# Patient Record
Sex: Female | Born: 1965 | Race: Black or African American | Hispanic: No | Marital: Single | Smoking: Never smoker
Health system: Southern US, Community
[De-identification: ages and names within clinical notes are randomized; demographics above are authoritative.]

## PROBLEM LIST (undated history)

## (undated) DIAGNOSIS — R87619 Unspecified abnormal cytological findings in specimens from cervix uteri: Secondary | ICD-10-CM

## (undated) DIAGNOSIS — Z5189 Encounter for other specified aftercare: Secondary | ICD-10-CM

## (undated) DIAGNOSIS — F419 Anxiety disorder, unspecified: Secondary | ICD-10-CM

## (undated) DIAGNOSIS — F329 Major depressive disorder, single episode, unspecified: Secondary | ICD-10-CM

## (undated) DIAGNOSIS — I10 Essential (primary) hypertension: Secondary | ICD-10-CM

## (undated) DIAGNOSIS — IMO0002 Reserved for concepts with insufficient information to code with codable children: Secondary | ICD-10-CM

## (undated) DIAGNOSIS — R52 Pain, unspecified: Secondary | ICD-10-CM

## (undated) DIAGNOSIS — F32A Depression, unspecified: Secondary | ICD-10-CM

## (undated) DIAGNOSIS — M1990 Unspecified osteoarthritis, unspecified site: Secondary | ICD-10-CM

## (undated) DIAGNOSIS — Z8719 Personal history of other diseases of the digestive system: Secondary | ICD-10-CM

## (undated) DIAGNOSIS — D649 Anemia, unspecified: Secondary | ICD-10-CM

## (undated) DIAGNOSIS — K219 Gastro-esophageal reflux disease without esophagitis: Secondary | ICD-10-CM

## (undated) HISTORY — PX: TUBAL LIGATION: SHX77

## (undated) HISTORY — DX: Anxiety disorder, unspecified: F41.9

## (undated) HISTORY — DX: Encounter for other specified aftercare: Z51.89

## (undated) HISTORY — PX: COLONOSCOPY: SHX174

## (undated) HISTORY — PX: OTHER SURGICAL HISTORY: SHX169

## (undated) HISTORY — PX: ENDOMETRIAL ABLATION: SHX621

## (undated) HISTORY — PX: UPPER GASTROINTESTINAL ENDOSCOPY: SHX188

---

## 2002-11-03 ENCOUNTER — Emergency Department (HOSPITAL_COMMUNITY): Admission: EM | Admit: 2002-11-03 | Discharge: 2002-11-03 | Payer: Self-pay | Admitting: Emergency Medicine

## 2002-11-03 ENCOUNTER — Encounter: Payer: Self-pay | Admitting: Emergency Medicine

## 2003-04-21 ENCOUNTER — Emergency Department (HOSPITAL_COMMUNITY): Admission: EM | Admit: 2003-04-21 | Discharge: 2003-04-21 | Payer: Self-pay | Admitting: *Deleted

## 2004-06-24 ENCOUNTER — Emergency Department (HOSPITAL_COMMUNITY): Admission: EM | Admit: 2004-06-24 | Discharge: 2004-06-24 | Payer: Self-pay | Admitting: Emergency Medicine

## 2004-10-23 ENCOUNTER — Emergency Department (HOSPITAL_COMMUNITY): Admission: EM | Admit: 2004-10-23 | Discharge: 2004-10-23 | Payer: Self-pay | Admitting: Family Medicine

## 2004-11-06 ENCOUNTER — Ambulatory Visit (HOSPITAL_COMMUNITY): Admission: RE | Admit: 2004-11-06 | Discharge: 2004-11-06 | Payer: Self-pay | Admitting: Orthopedic Surgery

## 2004-11-06 ENCOUNTER — Encounter (INDEPENDENT_AMBULATORY_CARE_PROVIDER_SITE_OTHER): Payer: Self-pay | Admitting: *Deleted

## 2004-11-06 ENCOUNTER — Ambulatory Visit (HOSPITAL_BASED_OUTPATIENT_CLINIC_OR_DEPARTMENT_OTHER): Admission: RE | Admit: 2004-11-06 | Discharge: 2004-11-06 | Payer: Self-pay | Admitting: Orthopedic Surgery

## 2006-01-15 ENCOUNTER — Emergency Department (HOSPITAL_COMMUNITY): Admission: EM | Admit: 2006-01-15 | Discharge: 2006-01-15 | Payer: Self-pay | Admitting: Emergency Medicine

## 2006-10-22 ENCOUNTER — Emergency Department (HOSPITAL_COMMUNITY): Admission: EM | Admit: 2006-10-22 | Discharge: 2006-10-23 | Payer: Self-pay | Admitting: Emergency Medicine

## 2007-01-13 ENCOUNTER — Emergency Department (HOSPITAL_COMMUNITY): Admission: EM | Admit: 2007-01-13 | Discharge: 2007-01-13 | Payer: Self-pay | Admitting: Emergency Medicine

## 2007-01-24 ENCOUNTER — Encounter: Admission: RE | Admit: 2007-01-24 | Discharge: 2007-01-24 | Payer: Self-pay | Admitting: Gastroenterology

## 2007-01-24 ENCOUNTER — Encounter: Payer: Self-pay | Admitting: Gastroenterology

## 2007-02-04 ENCOUNTER — Ambulatory Visit (HOSPITAL_COMMUNITY): Admission: RE | Admit: 2007-02-04 | Discharge: 2007-02-04 | Payer: Self-pay | Admitting: Gastroenterology

## 2007-02-04 ENCOUNTER — Encounter: Payer: Self-pay | Admitting: Gastroenterology

## 2007-07-12 ENCOUNTER — Emergency Department (HOSPITAL_COMMUNITY): Admission: EM | Admit: 2007-07-12 | Discharge: 2007-07-13 | Payer: Self-pay | Admitting: Emergency Medicine

## 2007-07-28 ENCOUNTER — Ambulatory Visit: Payer: Self-pay | Admitting: Obstetrics and Gynecology

## 2007-09-01 ENCOUNTER — Ambulatory Visit (HOSPITAL_COMMUNITY): Admission: RE | Admit: 2007-09-01 | Discharge: 2007-09-01 | Payer: Self-pay | Admitting: Obstetrics and Gynecology

## 2008-02-25 ENCOUNTER — Emergency Department (HOSPITAL_COMMUNITY): Admission: EM | Admit: 2008-02-25 | Discharge: 2008-02-25 | Payer: Self-pay | Admitting: Emergency Medicine

## 2008-08-13 ENCOUNTER — Emergency Department (HOSPITAL_COMMUNITY): Admission: EM | Admit: 2008-08-13 | Discharge: 2008-08-13 | Payer: Self-pay | Admitting: Emergency Medicine

## 2008-09-22 ENCOUNTER — Emergency Department (HOSPITAL_COMMUNITY): Admission: EM | Admit: 2008-09-22 | Discharge: 2008-09-22 | Payer: Self-pay | Admitting: Emergency Medicine

## 2008-10-12 ENCOUNTER — Ambulatory Visit (HOSPITAL_COMMUNITY): Admission: RE | Admit: 2008-10-12 | Discharge: 2008-10-12 | Payer: Self-pay | Admitting: Chiropractic Medicine

## 2009-01-18 ENCOUNTER — Emergency Department (HOSPITAL_COMMUNITY): Admission: EM | Admit: 2009-01-18 | Discharge: 2009-01-18 | Payer: Self-pay | Admitting: Emergency Medicine

## 2009-07-11 ENCOUNTER — Encounter: Payer: Self-pay | Admitting: Gastroenterology

## 2009-08-08 ENCOUNTER — Ambulatory Visit: Payer: Self-pay | Admitting: Gastroenterology

## 2009-08-08 DIAGNOSIS — D509 Iron deficiency anemia, unspecified: Secondary | ICD-10-CM | POA: Insufficient documentation

## 2009-08-08 DIAGNOSIS — R1013 Epigastric pain: Secondary | ICD-10-CM

## 2009-08-08 DIAGNOSIS — K625 Hemorrhage of anus and rectum: Secondary | ICD-10-CM | POA: Insufficient documentation

## 2009-08-08 DIAGNOSIS — K3189 Other diseases of stomach and duodenum: Secondary | ICD-10-CM | POA: Insufficient documentation

## 2009-08-14 ENCOUNTER — Telehealth: Payer: Self-pay | Admitting: Gastroenterology

## 2009-08-14 ENCOUNTER — Ambulatory Visit (HOSPITAL_COMMUNITY): Admission: RE | Admit: 2009-08-14 | Discharge: 2009-08-14 | Payer: Self-pay | Admitting: Gastroenterology

## 2009-08-14 ENCOUNTER — Ambulatory Visit: Payer: Self-pay | Admitting: Gastroenterology

## 2009-08-15 ENCOUNTER — Encounter: Payer: Self-pay | Admitting: Gastroenterology

## 2009-08-19 ENCOUNTER — Telehealth: Payer: Self-pay | Admitting: Physician Assistant

## 2009-08-22 ENCOUNTER — Encounter: Payer: Self-pay | Admitting: Gastroenterology

## 2009-08-22 ENCOUNTER — Ambulatory Visit: Payer: Self-pay | Admitting: Gastroenterology

## 2009-08-23 ENCOUNTER — Telehealth: Payer: Self-pay | Admitting: Gastroenterology

## 2009-08-26 ENCOUNTER — Encounter: Payer: Self-pay | Admitting: Gastroenterology

## 2009-09-10 ENCOUNTER — Telehealth: Payer: Self-pay | Admitting: Gastroenterology

## 2009-10-28 ENCOUNTER — Ambulatory Visit: Payer: Self-pay | Admitting: Gastroenterology

## 2009-10-28 DIAGNOSIS — K261 Acute duodenal ulcer with perforation: Secondary | ICD-10-CM | POA: Insufficient documentation

## 2009-11-14 ENCOUNTER — Telehealth: Payer: Self-pay | Admitting: Gastroenterology

## 2009-12-05 ENCOUNTER — Encounter: Admission: RE | Admit: 2009-12-05 | Discharge: 2009-12-05 | Payer: Self-pay | Admitting: Internal Medicine

## 2010-01-01 ENCOUNTER — Emergency Department (HOSPITAL_COMMUNITY): Admission: EM | Admit: 2010-01-01 | Discharge: 2010-01-01 | Payer: Self-pay | Admitting: Emergency Medicine

## 2010-02-06 ENCOUNTER — Encounter: Payer: Self-pay | Admitting: Gastroenterology

## 2010-02-18 ENCOUNTER — Ambulatory Visit: Payer: Self-pay | Admitting: Vascular Surgery

## 2010-02-18 ENCOUNTER — Encounter (INDEPENDENT_AMBULATORY_CARE_PROVIDER_SITE_OTHER): Payer: Self-pay | Admitting: Emergency Medicine

## 2010-02-18 ENCOUNTER — Emergency Department (HOSPITAL_COMMUNITY): Admission: EM | Admit: 2010-02-18 | Discharge: 2010-02-18 | Payer: Self-pay | Admitting: Emergency Medicine

## 2010-04-08 ENCOUNTER — Encounter: Payer: Self-pay | Admitting: Gastroenterology

## 2010-05-27 ENCOUNTER — Telehealth: Payer: Self-pay | Admitting: Gastroenterology

## 2010-09-02 ENCOUNTER — Emergency Department (HOSPITAL_BASED_OUTPATIENT_CLINIC_OR_DEPARTMENT_OTHER): Admission: EM | Admit: 2010-09-02 | Discharge: 2010-09-02 | Payer: Self-pay | Admitting: Emergency Medicine

## 2010-09-09 ENCOUNTER — Telehealth: Payer: Self-pay | Admitting: Gastroenterology

## 2010-09-15 ENCOUNTER — Telehealth: Payer: Self-pay | Admitting: Gastroenterology

## 2010-10-13 ENCOUNTER — Telehealth: Payer: Self-pay | Admitting: Gastroenterology

## 2010-10-14 ENCOUNTER — Ambulatory Visit: Payer: Self-pay | Admitting: Gastroenterology

## 2010-10-14 DIAGNOSIS — K648 Other hemorrhoids: Secondary | ICD-10-CM | POA: Insufficient documentation

## 2010-10-14 DIAGNOSIS — K269 Duodenal ulcer, unspecified as acute or chronic, without hemorrhage or perforation: Secondary | ICD-10-CM | POA: Insufficient documentation

## 2010-10-14 DIAGNOSIS — K589 Irritable bowel syndrome without diarrhea: Secondary | ICD-10-CM | POA: Insufficient documentation

## 2010-10-14 DIAGNOSIS — K219 Gastro-esophageal reflux disease without esophagitis: Secondary | ICD-10-CM | POA: Insufficient documentation

## 2010-10-14 DIAGNOSIS — K644 Residual hemorrhoidal skin tags: Secondary | ICD-10-CM | POA: Insufficient documentation

## 2010-10-15 ENCOUNTER — Encounter: Payer: Self-pay | Admitting: Physician Assistant

## 2010-10-15 ENCOUNTER — Ambulatory Visit: Payer: Self-pay | Admitting: Physician Assistant

## 2010-10-15 LAB — CONVERTED CEMR LAB
Albumin ELP: 54.3 % — ABNORMAL LOW (ref 55.8–66.1)
Alpha-1-Globulin: 3.9 % (ref 2.9–4.9)
Alpha-2-Globulin: 10.5 % (ref 7.1–11.8)
Beta Globulin: 5.9 % (ref 4.7–7.2)
Gamma Globulin: 19.6 % — ABNORMAL HIGH (ref 11.1–18.8)
Total Protein, Serum Electrophoresis: 7.4 g/dL (ref 6.0–8.3)

## 2010-10-21 LAB — CONVERTED CEMR LAB: H Pylori IgG: POSITIVE

## 2010-12-04 NOTE — Miscellaneous (Signed)
Summary: Percocet DENIED  Recieved a refill request for Percocet I [facility] ad advied them that the patient can not have any refills due to the fact that she has not had an office visit since last December and she no showed her last office visit. They state they will advise the patient. If she wants refills she must call and make an office visit with Dr. Arlyce Dice to discuss.

## 2010-12-04 NOTE — Assessment & Plan Note (Addendum)
Summary: Rectal Bleeding/LRH   History of Present Illness Visit Type: Follow-up Visit Primary GI MD: Melvia Heaps MD [facility] Primary Deanna Rodgers: Fleet Contras, MD Requesting Kinisha Rodgers: n/a Chief Complaint: rectal bleeding History of Present Illness:   45 YO FEMALE KNOWN TO DR. KAPLAN. SHE HAD A COLONOSCOPY IN 2008 BY DR. Randa Evens WHICH SHOWED DIVERTICULOSIS, AND HEMORRHOIDS. SHE SAW DR. KAPLAN IN OCT 2010 WITH C/O PERSISTENT RECTAL BLEEDING AND UNDERWENT FLEX-SIGMOID WITH BANDING OF INTERNAL HEMORRHOIDS. SHE ALSO HAD AN ENDOSCOPY IN OCT 2010 WHICH SHOWED A SMALL DUODENAL BULB ULCER. SHE WAS TREATED WITH ZEGERID.  SHE COMES IN TODAY WITH C/O PERSISTENT BLEEDING. SHE SAYS SHE REALLY DID NOT SEE ALOT OF IMPROVEMENT IN HER SXS AFTER THE BANDING,AND HAS BEEN SEEING BLOOD WITH ALMOST EVERY BM OVER THE PAST YEAR. SHE IS CONCERNED AS SHE WAS TOLD THAT SHE IS ANEMIC BY HER PRIMARY MD. SHE REALLY IS NOT  HAVING RECTAL PAIN. SHE DOES C/O ABDOMINAL DISCOMFOrt,CRAMPING ABD BLOATIING OFF AND ON. 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   GI Review of Systems    Reports abdominal pain.     Location of  Abdominal pain:  YO FEMALE lower abdomen.    Denies acid reflux, belching, bloating, chest pain, dysphagia with liquids, dysphagia with solids, heartburn, loss of appetite, nausea, vomiting, vomiting blood, weight loss, and  weight gain.      Reports rectal bleeding.     Denies anal fissure, black tarry stools, change in bowel habit, constipation, diarrhea, diverticulosis, fecal incontinence, heme positive stool, hemorrhoids, irritable bowel syndrome, jaundice, light color stool, liver problems, and  rectal pain.    Current Medications (verified): 1)  Percocet 10-650 Mg Tabs (Oxycodone-Acetaminophen) .... Take One Every 4-6 Hours As Needed For Rectal Pain. 2)  Ultram 50 Mg Tabs (Tramadol Hcl) .... Take 1 P.o. Every 6 Hrs. As  Needed For Pain 3)  Darvocet-N 100 100-650 Mg Tabs (Propoxyphene N-Apap) .Marland Kitchen.. 1 Tab Every 4-6 Hours As Needed 4)  Nexium 40 Mg Cpdr (Esomeprazole Magnesium) .... Take 1 Capsule 30 Min Prior To Breakfast 5)  Anusol-Hc 25 Mg Supp (Hydrocortisone Acetate) .... Use 1 Suppository At Bedtime For 1 Month 6)  Dicyclomine Hcl 10 Mg Caps (Dicyclomine Hcl) .... Take 1 Capsule 2-3 Times Daily As Needed For Cramping and Stomach Pain  Allergies (verified): No Known Drug Allergies  Past History:  Past Medical History: DIVERTICULOSIS INTERNAL HEMORRHOIDS HX OF DUODENAL ULCER 2010.  Past Surgical History: Reviewed history from 08/08/2009 and no changes required. C section Tubal Ligation  Family History: Reviewed history from 08/08/2009 and no changes required. Family History of Diabetes: maternal grandmother Family History of Irritable Bowel Syndrome:maternal grandmother Family History of Crohn's: maternal uncle No FH of Colon Cancer  Social History: Reviewed history from 10/28/2009 and no changes required. Occupation: [employer]  Patient is a former smoker.  Alcohol Use - yes Daily Caffeine Use Illicit Drug Use - no  Review of Systems  The patient denies allergy/sinus, anemia, anxiety-new, arthritis/joint pain, back pain, blood in urine, breast changes/lumps, change in vision, confusion, cough, coughing up blood, depression-new, fainting, fatigue, fever, headaches-new, hearing problems, heart murmur, heart rhythm changes, itching, menstrual pain, muscle pains/cramps, night sweats, nosebleeds, pregnancy symptoms, shortness of breath, skin rash, sleeping problems, sore throat, swelling of feet/legs, swollen lymph glands, thirst - excessive , urination - excessive , urination changes/pain, urine leakage, vision changes, and voice  change.         SEE HPI  Vital Signs:  Patient profile:   45 year old female Height:      66 inches Weight:      160 pounds BMI:     25.92 Pulse rate:    78 / minute Pulse rhythm:   regular BP sitting:   140 / 74  (right arm)  Vitals Entered By: Chales Abrahams CMA Duncan Dull) (October 14, 2010 9:51 AM)  Physical Exam  General:  Well developed, well nourished, no acute distress. Head:  Normocephalic and atraumatic. Eyes:  PERRLA, no icterus. Lungs:  Clear throughout to auscultation. Heart:  Regular rate and rhythm; no murmurs, rubs,  or bruits. Abdomen:  SOFT, NONTENDER, NO MASS OR HSM,BS+ Rectal:  INTERNAL AND EXTERNAL HEMORRHOIDS,NONTHROMBOSED, NO STOOL IN VAULT,CLEAR MUCOID  BLOOD ON GLOVE Extremities:  No clubbing, cyanosis, edema or deformities noted. Neurologic:  Alert and  oriented x4;  grossly normal neurologically. Psych:  Alert and cooperative. Normal mood and affect.   Impression & Recommendations:  Problem # 1:  RECTAL BLEEDING (ICD-569.3) Assessment Unchanged 45 YO FEMALE WITH PERSISTENT SMALL VOLUME HEMATOCHEZIA OVE RTHE PAST ONE YEAR S/P BANDING OF INTERNAL HEMORRHOIDS. HER BLEEDING IS HEMORRHOIDAL WITH INTERNAL AND EXTERNAL HEMORRHOIDS ON EXAM. ? OF ANEMIA   OBTAIN RESULTS OF RECENT LABS FROM HER PRIMARY M.D -DR. AVBUERE SCHEDULE FOR SURGICAL CONSULT ANUSOL HC SUPP at bedtime X ONE MONTH  Problem # 2:  IRRITABLE BOWEL SYNDROME (ICD-564.1) Assessment: Unchanged TRY BENTYL 10 MG 2-3 S DAILY AS NEEDED FOR CRAMPING  Problem # 3:  ACUT DUODEN ULCER W/PERF W/O MENTION OBSTRUCTION (ICD-532.10) Assessment: Unchanged TREATED  10/10 ,PERSISTENT GERD SXS   RESTART TRIAL OF NEXIUM 40 MG DAILY CHECK H.PYLORI AB  Problem # 4:  DIVERTICULOSIS Assessment: Comment Only  Other Orders: TLB-H. Pylori Abs(Helicobacter Pylori) ([id number]-[id number])  Patient Instructions: 1)  We sent prescriptions for D[facility]clomine and [facility] [address] 2)  We have given you samples of nexium. Take 1 capsule 30 min prior to breakfast.  You can use the Zegerid or Prilosec OTC after the samples are gone. 3)  We will call you with the  appointment at [facility].  We are in contact with them regarding the appoinment. 4)  Copy sent to : Fleet Contras, Md 5)  The medication list was reviewed and reconciled.  All changed / newly prescribed medications were explained.  A complete medication list was provided to the patient / caregiver. Prescriptions: DICYCLOMINE HCL 10 MG CAPS (DICYCLOMINE HCL) Take 1 capsule 2-3 times daily as needed for cramping and stomach pain  #90 x 1   Entered by:   Lowry Ram NCMA   Authorized by:   Sammuel Cooper PA-c   Signed by:   Lowry Ram NCMA on 10/14/2010   Method used:   [facility]  [address] [id number]       [address]       [place], [place]  [address]       Ph: [phone] or [phone]       Fax: [phone]   RxID:   [id number] ANUSOL-HC 25 MG SUPP (HYDROCORTISONE ACETATE) Use 1 suppository at bedtime for 1 month  #30 x 0   Entered by:   Lowry Ram NCMA   Authorized by:   Sammuel Cooper PA-c   Signed by:   Lowry Ram  NCMA on 10/14/2010   Method used:   [facility]        [facility]  [address] [id number]       [address]       [place], [place]  [address]       Ph: [phone] or [phone]       Fax: [phone]   RxID:   [id number]   Appended Document: Rectal Bleeding/LRH Called pt to advise her to come to our lab today or tomorrow and to come to our 3rd floor to pick up samples of iron supplement, Integra Plus.  I asked her to take a note to come to our lab on 3-11-03-10 to repeat iron study labs.  I put in a flag for myself to call her 01-01-11. Also let her know the appt at [facility] with Dr. Karie Soda is 10-31-2010 arriving at 2:30PM for a 2:50 PM appt.  Appended Document: Rectal Bleeding/LRH I called the pt today and told her to come to the lab for the CBC Diff,  FE/TIBC, FE SAT . I sent myselt a flag from Dec 2011. You saw the pt and wanted her to have these labs in  March 2012.  She did take the Iron samples we gave her.

## 2010-12-04 NOTE — Progress Notes (Signed)
Summary: Percocet refill denied  Phone Note Refill Request Message from:  Fax from Pharmacy on May 27, 2010 11:10 AM  Refills Requested: Medication #1:  PERCOCET 10-650 MG TABS Take one every 4-6 hours as needed for rectal pain. Denied pt needs an office appointment before any further refills   Method Requested: Fax to Local Pharmacy Initial call taken by: Merri Ray CMA Duncan Dull),  May 27, 2010 11:11 AM

## 2010-12-04 NOTE — Progress Notes (Signed)
Summary: refill fax request from pharmacy  Phone Note Refill Request Message from:  Fax from Pharmacy on November 14, 2009 3:18 PM  Refills Requested: Medication #1:  DARVOCET-N 100 100-650 MG TABS 1 tab every 4-6 hours as needed   Dosage confirmed as above?Dosage Confirmed   Brand Name Necessary? No Pt must keep scheduled appointment on 12/02/2009   No Further refills   Method Requested: Fax to Local Pharmacy Next Appointment Scheduled: 12/02/2009 Initial call taken by: Merri Ray CMA Duncan Dull),  November 14, 2009 3:18 PM

## 2010-12-04 NOTE — Medication Information (Signed)
Summary: Propoxyph Acetaminophn [facility]  [facility]n [facility]   Imported By: Lennie Odor 02/20/2010 14:51:09  _____________________________________________________________________  External Attachment:    Type:   Image     Comment:   External Document

## 2010-12-04 NOTE — Progress Notes (Signed)
Summary: Zegerid refill request  Phone Note Refill Request Message from:  Fax from Pharmacy on September 09, 2010 10:45 AM  Refills Requested: Medication #1:  ZEGERID OTC 20-1100 MG CAPS 1 by mouth once daily   Dosage confirmed as above?Dosage Confirmed   Brand Name Necessary? No   Supply Requested: 3 months Initial call taken by: Merri Ray CMA Duncan Dull),  September 09, 2010 10:46 AM    Prescriptions: ZEGERID OTC 20-1100 MG CAPS (OMEPRAZOLE-SODIUM BICARBONATE) 1 by mouth once daily  #30 x 2   Entered by:   Merri Ray CMA (AAMA)   Authorized by:   Louis Meckel MD   Signed by:   Merri Ray CMA (AAMA) on 09/09/2010   Method used:   [facility]        [facility]  [address] [id number]       [address]       [place], [place]  [address]       Ph: [phone] or [phone]       Fax: [phone]   RxID:   [id number]

## 2010-12-04 NOTE — Progress Notes (Signed)
Summary: Insurance Zegerid  Phone Note [employer] back at [employer] [phone]   Call placed by: Merri Ray CMA Duncan Dull),  September 15, 2010 10:01 AM Summary of Call: Called pts insurance Medicaid at [phone], Pt needs to have tried and failed Nexium this year before thewill give her Zegerid. L/M for pt to return call Initial call taken by: Merri Ray CMA Duncan Dull),  September 15, 2010 10:02 AM  Follow-up for Phone Call        Tried to contact pt about her trying Nexium, can not get in touch with pt. Will wait and send in prescription when pt returns call. Follow-up by: Merri Ray CMA Duncan Dull),  September 18, 2010 10:56 AM     Appended Document: Insurance Zegerid Call pt and ask her if Nexium helped. If so, Medicaid may cover Nexium.

## 2010-12-04 NOTE — Progress Notes (Signed)
Summary: Triage  Phone Note Call from Patient Call back at [phone]   Caller: Patient Call For: Dr. Arlyce Dice Reason for Call: Talk to Nurse Summary of Call: having rectal bleeding, anemic....requesting sooner appt. than next avail. Initial call taken by: Karna Christmas,  October 13, 2010 12:33 PM  Follow-up for Phone Call        Spoke with patient and she states she is having problems with Rectal bleeding, states she wants to be seen. Appointment made with Mike Gip, PA for 10/14/10 @ 10am

## 2010-12-19 ENCOUNTER — Encounter (HOSPITAL_COMMUNITY): Payer: Medicaid Other | Attending: Surgery

## 2010-12-19 ENCOUNTER — Other Ambulatory Visit: Payer: Self-pay | Admitting: Surgery

## 2010-12-19 DIAGNOSIS — Z01812 Encounter for preprocedural laboratory examination: Secondary | ICD-10-CM | POA: Insufficient documentation

## 2010-12-19 LAB — CBC
HCT: 35.6 % — ABNORMAL LOW (ref 36.0–46.0)
Hemoglobin: 10.8 g/dL — ABNORMAL LOW (ref 12.0–15.0)
MCH: 20.8 pg — ABNORMAL LOW (ref 26.0–34.0)
MCHC: 30.3 g/dL (ref 30.0–36.0)
MCV: 68.6 fL — ABNORMAL LOW (ref 78.0–100.0)
Platelets: 181 10*3/uL (ref 150–400)
RBC: 5.19 MIL/uL — ABNORMAL HIGH (ref 3.87–5.11)
RDW: 16.2 % — ABNORMAL HIGH (ref 11.5–15.5)
WBC: 4.3 10*3/uL (ref 4.0–10.5)

## 2010-12-19 LAB — HCG, SERUM, QUALITATIVE: Preg, Serum: NEGATIVE

## 2010-12-19 LAB — SURGICAL PCR SCREEN
MRSA, PCR: NEGATIVE
Staphylococcus aureus: NEGATIVE

## 2010-12-21 ENCOUNTER — Emergency Department (HOSPITAL_COMMUNITY)
Admission: EM | Admit: 2010-12-21 | Discharge: 2010-12-21 | Disposition: A | Discharge status: Home / Self Care | Payer: Medicaid Other | Attending: Emergency Medicine | Admitting: Emergency Medicine

## 2010-12-21 DIAGNOSIS — H109 Unspecified conjunctivitis: Secondary | ICD-10-CM | POA: Insufficient documentation

## 2010-12-21 DIAGNOSIS — H11429 Conjunctival edema, unspecified eye: Secondary | ICD-10-CM | POA: Insufficient documentation

## 2010-12-21 DIAGNOSIS — Z79899 Other long term (current) drug therapy: Secondary | ICD-10-CM | POA: Insufficient documentation

## 2010-12-21 DIAGNOSIS — K219 Gastro-esophageal reflux disease without esophagitis: Secondary | ICD-10-CM | POA: Insufficient documentation

## 2010-12-21 DIAGNOSIS — H5789 Other specified disorders of eye and adnexa: Secondary | ICD-10-CM | POA: Insufficient documentation

## 2010-12-26 ENCOUNTER — Ambulatory Visit (HOSPITAL_COMMUNITY)
Admission: RE | Admit: 2010-12-26 | Discharge: 2010-12-26 | Disposition: A | Discharge status: Home / Self Care | Payer: Medicaid Other | Source: Ambulatory Visit | Attending: Surgery | Admitting: Surgery

## 2010-12-26 ENCOUNTER — Other Ambulatory Visit: Payer: Self-pay | Admitting: Surgery

## 2010-12-26 DIAGNOSIS — K644 Residual hemorrhoidal skin tags: Secondary | ICD-10-CM | POA: Insufficient documentation

## 2010-12-26 DIAGNOSIS — D573 Sickle-cell trait: Secondary | ICD-10-CM | POA: Insufficient documentation

## 2010-12-26 DIAGNOSIS — K648 Other hemorrhoids: Secondary | ICD-10-CM | POA: Insufficient documentation

## 2011-01-01 ENCOUNTER — Other Ambulatory Visit: Payer: Self-pay

## 2011-01-05 ENCOUNTER — Other Ambulatory Visit: Payer: Self-pay | Admitting: Internal Medicine

## 2011-01-12 ENCOUNTER — Other Ambulatory Visit: Payer: Medicaid Other

## 2011-01-12 NOTE — Op Note (Signed)
Deanna Rodgers, Deanna Rodgers              ACCOUNT NO.:  [id number]  MEDICAL RECORD NO.:  [id number]           PATIENT TYPE:  O  LOCATION:  DAYL                         FACILITY:  [facility]  PHYSICIAN:  Ardeth Sportsman, MD     DATE OF BIRTH:  July 07, 1966  DATE OF PROCEDURE:  12/26/2010 DATE OF DISCHARGE:                              OPERATIVE REPORT   PRIMARY CARE PHYSICIAN:  Fleet Contras, MD  GASTROENTEROLOGIST:  Barbette Hair. Arlyce Dice, MD, [facility].  SURGEON:  Ardeth Sportsman, MD  PREOPERATIVE DIAGNOSIS:  Internal and external hemorrhoids with prolapse and bleeding.  POSTOPERATIVE DIAGNOSIS:  Internal and external hemorrhoids with prolapse and bleeding.  PROCEDURE PERFORMED: 1. Procedure for prolapse and hemorrhoids and stapled hemorrhoidopexy. 2. Right posterior and anterior external hemorrhoidectomies.  ANESTHESIA: 1. General anesthesia. 2. Bilateral anorectal anterior and posterior regional nerve blocks.  SPECIMENS: 1. 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 2. Right posterior and right anterior external hemorrhoids.  ESTIMATED BLOOD LOSS:  Minimal.  COMPLICATIONS:  None apparent.  DRAINS:  None.  INDICATION:  Deanna Rodgers is a 45 year old female who struggled with constipation for sometime.  She has been followed by Dr. Concepcion Elk and Dr. Arlyce Dice.  She has had this better regulated.  She had a prior colonoscopy that just showed diverticulum only.  She has persistent bleeding and has some anemia and she had worsening prolapse.  I had already injected and she had banding in the past and had persistent bleeding problems.  Anatomy and physiology of the anorectal canal and anorectum mechanism was discussed.  Pathophysiology of hemorrhoids was discussed.  Given the fact that they are now prolapsed and persistent bleed despite office management only, I recommended consideration of hemorrhoidectomy. Because she has significant mucosal prolapse, I thought she  would be a good candidate for PPH as THD is not currently available through the insurance firm.  Possibility of excision of external hemorrhoid tags as well was discussed.  Risks, benefits, alternatives discussed.  Questions answered and she agreed to proceed.  OPERATION FINDINGS:  She had a significant hemorrhoidal tissue in all 3 piles, especially internally.  Externally, she had a significant right posterior greater than right anterior hemorrhoids.  Left lateral hemorrhoid did not have any significant external component.  DESCRIPTION OF PROCEDURE:  Informed consent was confirmed.  The patient underwent general anesthesia without any difficulty.  She received IV cefoxitin.  She was positioned in prone position.  Her buttocks were gently taped apart and her perineum, rectal canal, and vaginal canal were separately prepped and then almost draped in a sterile fashion.  A surgical timeout confirmed our plan.  I placed the anorectal block with first 10 mL with bupivacaine and 10% dilution of Wydase.  I did examination to confirm findings noted above. I placed a 2-0 Novafil Prolene stitch on a taper needle.  I bent it a little bit to make it a UR-6.  I did this about 5-6 cm proximal to the anal verge with significant pursestring stitch.  I pulled the pursestring down and had good pursestring of the mucosal tissue.  I did rectovaginal examination and vagina  was not pulled into this.  I placed the dilator and tails through the [facility] stapler and advanced the anvil more proximally.  I advanced the anvil into the anal canal and the PPH stapling system into the anal canal and tightened down the anvil. The stapler was at 4.5 cm in the canal.  I held the stapler clamp for a minute, fired for 30 seconds and released.  Inspection noted a good healthy swab of mucosal tissue.  Of note, I did vaginal examination after stapler was tied down and after it was fired and the rectovaginal septum was not  involved.  I had a good serous circumferential bite.  There was a little bit of oozing on the staple line in the anterior midline, which I controlled with few interrupted figure-of-8 stitches x3.  On inspection, she still had persistent external hemorrhoid on the right posterior greater than right anterior component.  Because of that, I decided to do external hemorrhoidectomy.  I excised them using a Harmonic stapler for better hemostasis taking care to remove the largest pedunculated and tapering it down more proximally into the anal canal a few centimeters.  She did have some tissue in the right lateral aspect, but I left healthy 1.5 cm tract in between the anterior and posterior resections from bridge.  I closed those wounds using 4-0 Vicryl running stitches from the rectum to the anoderm longitudinally and then closed the external skin to the anoderm transversally in sort of a T-shaped closure to minimize stenosis.  Hemostasis was excellent.  I did confirm with inspection and hemostasis was good on the staple line and elsewhere.  I placed a Gelfoam bullet into the region.  Placed another 20 cc of local for more significant block.  The patient was extubated and taken to the recovery room in stable condition.  I discussed postop care in detail with the patient in the office and in the holding area as well.  I have written instructions and I am about to discuss it with the family as well.     Ardeth Sportsman, MD     SCG/MEDQ  D:  12/26/2010  T:  12/26/2010  Job:  [id number]  cc:   Fleet Contras, M.D. Fax: [phone]  Barbette Hair. Arlyce Dice, MD,FACG [address] [place] [address]  Electronically Signed by Karie Soda MD on 01/12/2011 12:21:39 PM

## 2011-01-13 LAB — URINALYSIS, ROUTINE W REFLEX MICROSCOPIC
Bilirubin Urine: NEGATIVE
Glucose, UA: NEGATIVE mg/dL
Hgb urine dipstick: NEGATIVE
Ketones, ur: NEGATIVE mg/dL
Nitrite: NEGATIVE
Protein, ur: NEGATIVE mg/dL
Specific Gravity, Urine: 1.004 — ABNORMAL LOW (ref 1.005–1.030)
Urobilinogen, UA: 0.2 mg/dL (ref 0.0–1.0)
pH: 7.5 (ref 5.0–8.0)

## 2011-01-13 LAB — CBC
HCT: 34.6 % — ABNORMAL LOW (ref 36.0–46.0)
Hemoglobin: 10.7 g/dL — ABNORMAL LOW (ref 12.0–15.0)
MCH: 21.1 pg — ABNORMAL LOW (ref 26.0–34.0)
MCHC: 31 g/dL (ref 30.0–36.0)
MCV: 67.9 fL — ABNORMAL LOW (ref 78.0–100.0)
Platelets: 184 10*3/uL (ref 150–400)
RBC: 5.1 MIL/uL (ref 3.87–5.11)
RDW: 15.4 % (ref 11.5–15.5)
WBC: 4.8 10*3/uL (ref 4.0–10.5)

## 2011-01-13 LAB — BASIC METABOLIC PANEL
BUN: 8 mg/dL (ref 6–23)
CO2: 26 mEq/L (ref 19–32)
Calcium: 9 mg/dL (ref 8.4–10.5)
Chloride: 104 mEq/L (ref 96–112)
Creatinine, Ser: 0.6 mg/dL (ref 0.4–1.2)
GFR calc Af Amer: >60 mL/min (ref >60)
GFR calc non Af Amer: >60 mL/min (ref >60)
Glucose, Bld: 77 mg/dL (ref 70–99)
Potassium: 3.4 mEq/L — ABNORMAL LOW (ref 3.5–5.1)
Sodium: 143 mEq/L (ref 135–145)

## 2011-01-13 LAB — PREGNANCY, URINE: Preg Test, Ur: NEGATIVE

## 2011-01-13 LAB — DIFFERENTIAL
Basophils Absolute: 0.1 10*3/uL (ref 0.0–0.1)
Basophils Relative: 2 % — ABNORMAL HIGH (ref 0–1)
Eosinophils Absolute: 0 10*3/uL (ref 0.0–0.7)
Eosinophils Relative: 1 % (ref 0–5)
Lymphocytes Relative: 37 % (ref 12–46)
Lymphs Abs: 1.8 10*3/uL (ref 0.7–4.0)
Monocytes Absolute: 0.4 10*3/uL (ref 0.1–1.0)
Monocytes Relative: 9 % (ref 3–12)
Neutro Abs: 2.5 10*3/uL (ref 1.7–7.7)
Neutrophils Relative %: 52 % (ref 43–77)

## 2011-01-13 LAB — HEMOCCULT GUIAC POC 1CARD (OFFICE): Fecal Occult Bld: NEGATIVE

## 2011-01-20 LAB — DIFFERENTIAL
Basophils Absolute: 0 10*3/uL (ref 0.0–0.1)
Basophils Relative: 0 % (ref 0–1)
Eosinophils Absolute: 0.1 10*3/uL (ref 0.0–0.7)
Eosinophils Relative: 1 % (ref 0–5)
Lymphocytes Relative: 37 % (ref 12–46)
Lymphs Abs: 2 10*3/uL (ref 0.7–4.0)
Monocytes Absolute: 0.6 10*3/uL (ref 0.1–1.0)
Monocytes Relative: 10 % (ref 3–12)
Neutro Abs: 2.8 10*3/uL (ref 1.7–7.7)
Neutrophils Relative %: 52 % (ref 43–77)

## 2011-01-20 LAB — BASIC METABOLIC PANEL
BUN: 11 mg/dL (ref 6–23)
CO2: 23 mEq/L (ref 19–32)
Calcium: 8.6 mg/dL (ref 8.4–10.5)
Chloride: 107 mEq/L (ref 96–112)
Creatinine, Ser: 0.53 mg/dL (ref 0.4–1.2)
GFR calc Af Amer: >60 mL/min (ref >60)
GFR calc non Af Amer: >60 mL/min (ref >60)
Glucose, Bld: 94 mg/dL (ref 70–99)
Potassium: 3.6 mEq/L (ref 3.5–5.1)
Sodium: 135 mEq/L (ref 135–145)

## 2011-01-20 LAB — CBC
HCT: 31.8 % — ABNORMAL LOW (ref 36.0–46.0)
Hemoglobin: 10.1 g/dL — ABNORMAL LOW (ref 12.0–15.0)
MCHC: 31.8 g/dL (ref 30.0–36.0)
MCV: 65.3 fL — ABNORMAL LOW (ref 78.0–100.0)
Platelets: 298 10*3/uL (ref 150–400)
RBC: 4.87 MIL/uL (ref 3.87–5.11)
RDW: 18.1 % — ABNORMAL HIGH (ref 11.5–15.5)
WBC: 5.5 10*3/uL (ref 4.0–10.5)

## 2011-01-20 LAB — D-DIMER, QUANTITATIVE: D-Dimer, Quant: 0.64 ug/mL-FEU — ABNORMAL HIGH (ref 0.00–0.48)

## 2011-02-12 LAB — POCT I-STAT, CHEM 8
BUN: 19 mg/dL (ref 6–23)
Calcium, Ion: 1.14 mmol/L (ref 1.12–1.32)
Chloride: 108 mEq/L (ref 96–112)
Creatinine, Ser: 0.8 mg/dL (ref 0.4–1.2)
Glucose, Bld: 87 mg/dL (ref 70–99)
HCT: 36 % (ref 36.0–46.0)
Hemoglobin: 12.2 g/dL (ref 12.0–15.0)
Potassium: 3.5 mEq/L (ref 3.5–5.1)
Sodium: 141 mEq/L (ref 135–145)
TCO2: 24 mmol/L (ref 0–100)

## 2011-02-12 LAB — CBC
HCT: 32.9 % — ABNORMAL LOW (ref 36.0–46.0)
Hemoglobin: 10.2 g/dL — ABNORMAL LOW (ref 12.0–15.0)
MCHC: 31.1 g/dL (ref 30.0–36.0)
MCV: 67.5 fL — ABNORMAL LOW (ref 78.0–100.0)
Platelets: 254 10*3/uL (ref 150–400)
RBC: 4.87 MIL/uL (ref 3.87–5.11)
RDW: 18.1 % — ABNORMAL HIGH (ref 11.5–15.5)
WBC: 5.4 10*3/uL (ref 4.0–10.5)

## 2011-02-12 LAB — DIFFERENTIAL
Basophils Absolute: 0 10*3/uL (ref 0.0–0.1)
Basophils Relative: 1 % (ref 0–1)
Eosinophils Absolute: 0.1 10*3/uL (ref 0.0–0.7)
Eosinophils Relative: 2 % (ref 0–5)
Lymphocytes Relative: 35 % (ref 12–46)
Lymphs Abs: 1.9 10*3/uL (ref 0.7–4.0)
Monocytes Absolute: 0.5 10*3/uL (ref 0.1–1.0)
Monocytes Relative: 9 % (ref 3–12)
Neutro Abs: 2.8 10*3/uL (ref 1.7–7.7)
Neutrophils Relative %: 53 % (ref 43–77)

## 2011-02-12 LAB — HEMOCCULT GUIAC POC 1CARD (OFFICE): Fecal Occult Bld: POSITIVE

## 2011-03-09 ENCOUNTER — Other Ambulatory Visit: Payer: Self-pay | Admitting: Gastroenterology

## 2011-03-17 NOTE — Group Therapy Note (Signed)
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  She came in because  on the 9th of this month she went into [facility] with  severe abrupt onset of bilateral back pain in the sacroiliac area with  no significant radiation.  They got a CT scan on her as well as a pelvic  and abdominal ultrasound.  During this they found diverticulosis of the  large colon and they found a small follicular 3 cm cyst on an ovary.  The patient was told that all of the pain was related to this cyst.  In  talking to the patient she has had several problems.  She has been  followed by GYNs in [facility].  She says that she has had abnormal Pap  smears in the past but she has never had colposcopy and she had a Pap  smear there just a few days ago which she does not have the result for  yet.  She is seen by the gastrointestinal people at [facility] for  consistent frank bloody stools.  She says that she bleeds every time she  has a bowel movement and as a matter of fact when she has more than a  few a day she seems to get weak because there is always blood in the  toilet.  She has had a colonoscopy and they tell her that nothing is  wrong.  She has had some small internal hemorrhoids they told her.  I  have suggested that she get back to the gastrointestinal doctors to see  what is causing her rectal bleeding.   In addition to this she was not told she said in the emergency room that  she had the renolithiasis and I have no idea if this needs any follow-  up.  I have asked her to consult with a  urologist on that point but on  her main point I think that she has neuromuscular sacroiliac pain that  is tender to touch in that area and I think she needs to see an  orthopedic surgeon.   I have referred her and have verbally told her to go over and see one.  She says she will go ahead and comply with that.  I have assured her  that this small cyst is not the cause of her disabling back pain even  though it had an abrupt onset and it has not faded in time and this is  not typical of any kind of ovarian cyst.  The pelvis otherwise looked  completely normal except for some tiny leiomyomata uteri.  In addition  while she was at [facility] they  told her her thyroid was enlarged but  ran no tests.  She is worried about that.  I am going to do TSH, T4, and  T3, and also I have put her on iron as she has a hemoglobin of 10.  When  the blood test was done on the 9th of September at [facility] a the  [employer].   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           ______________________________  Argentina Donovan, MD    PR/MEDQ  D:  07/28/2007  T:  07/29/2007  Job:  [id number]   cc:   Argentina Donovan, MD

## 2011-03-20 NOTE — Op Note (Signed)
Deanna Rodgers, Deanna Rodgers             ACCOUNT NO.:  [id number]   MEDICAL RECORD NO.:  [id number]          PATIENT TYPE:  AMB   LOCATION:  ENDO                         FACILITY:  [facility]   PHYSICIAN:  James L. Malon Kindle., M.D.DATE OF BIRTH:  07/11/1966   DATE OF PROCEDURE:  DATE OF DISCHARGE:                               OPERATIVE REPORT   PROCEDURE:  Colonoscopy.   MEDICATIONS:  Fentanyl 100 mcg, Versed 10 mg IV.   INDICATIONS:  Continued rectal bleeding, barium enema showed a scattered  tic, give her age and continued bleeding, I felt that colonoscopy was  appropriate.   DESCRIPTION OF PROCEDURE:  The procedure was explained to the patient  and consent was obtained.  The patient was placed in the left lateral  decubitus position.  The Pentax adult scope was inserted and advanced.  Prep was excellent and advanced to the cecum without difficulty.  The  ileocecal valve and appendiceal orifice were seen.  The scope was  withdrawn to the cecum, ascending, transverse, descending and sigmoid  colon seen well.  A few diverticula really minimal in the sigmoid colon.  In the retroflexed view, the patient was to have large internal  hemorrhoids, not actively bleeding.  The scope was withdrawn and the  patient tolerated the procedure well.   ASSESSMENT:  Rectal bleeding probably due to large internal hemorrhoids,  given hemorrhoid instruction sheet and seen back in the office in two to  three weeks.           ______________________________  Llana Aliment Malon Kindle., M.D.     Waldron Session  D:  02/04/2007  T:  02/05/2007  Job:  [id number]

## 2011-03-20 NOTE — Op Note (Signed)
Deanna Rodgers, Deanna Rodgers              ACCOUNT NO.:  [id number]   MEDICAL RECORD NO.:  [id number]          PATIENT TYPE:  AMB   LOCATION:  DSC                          FACILITY:  [facility]   PHYSICIAN:  Nadara Mustard, MD     DATE OF BIRTH:  02-23-1966   DATE OF PROCEDURE:  11/06/2004  DATE OF DISCHARGE:                                 OPERATIVE REPORT   PREOPERATIVE DIAGNOSIS:  Ganglion cyst, left dorsal wrist, scapholunate  interval.   POSTOPERATIVE DIAGNOSIS:  Ganglion cyst, left dorsal wrist, scapholunate  interval.   OPERATION PERFORMED:  Excision ganglion cyst, left wrist.   SURGEON:  Nadara Mustard, M.D.   ANESTHESIA:  General.   ESTIMATED BLOOD LOSS:  Minimal.   TOURNIQUET TIME:  Esmarch at the wrist for approximately 13 minutes.   DISPOSITION:  To post anesthesia care unit in stable condition.   INDICATIONS FOR PROCEDURE:  The patient is a 45 year old woman with a  painful ganglion cyst over the dorsum of her left wrist.  She has failed  conservative care and has pain with activities of daily living and presents  at this time for surgical intervention.  The risks and benefits were  discussed including infection, neurovascular injury, numbness, neuroma on  the dorsum of her hand, recurrence of the cyst, need for additional surgery.  The patient states she understands and wishes to proceed at this time.   DESCRIPTION OF PROCEDURE:  The patient was brought to the operating room 5  and underwent a general anesthetic.  After adequate level of anesthesia  obtained, the patient's left upper extremity was prepped using DuraPrep and  draped into a sterile field.  A transverse incision was then made over the  scapholunate interval in line with the creases of the skin.  Sharp  dissection was carried down through the skin and blunt dissection was then  carried down through the ganglion cyst.  The ganglion cyst was excised as  well as the stalk which extended from the scapholunate  interval.  A block of  the capsule approximately 3 x 4 mm was excised from the scapholunate  interval to prevent recurrence.  The wound was irrigated.  The Esmarch was  released after 13 minutes.  Hemostasis was obtained with bipolar  electrocautery.  The skin was closed using 4-0 nylon with a vertical  mattress suture.  The wound was covered with Adaptic orthopedic sponges,  sterile Webril and a Coban dressing.  The patient was extubated and taken to  PACU in stable condition.      Vernia Buff  MVD/MEDQ  D:  11/06/2004  T:  11/06/2004  Job:  [id number]

## 2011-05-15 ENCOUNTER — Emergency Department (HOSPITAL_COMMUNITY)
Admission: EM | Admit: 2011-05-15 | Discharge: 2011-05-15 | Disposition: A | Discharge status: Home / Self Care | Payer: Medicaid Other | Attending: Emergency Medicine | Admitting: Emergency Medicine

## 2011-05-15 ENCOUNTER — Emergency Department (HOSPITAL_COMMUNITY): Payer: Medicaid Other

## 2011-05-15 DIAGNOSIS — M76899 Other specified enthesopathies of unspecified lower limb, excluding foot: Secondary | ICD-10-CM | POA: Insufficient documentation

## 2011-05-15 DIAGNOSIS — IMO0002 Reserved for concepts with insufficient information to code with codable children: Secondary | ICD-10-CM | POA: Insufficient documentation

## 2011-05-15 DIAGNOSIS — M7989 Other specified soft tissue disorders: Secondary | ICD-10-CM | POA: Insufficient documentation

## 2011-05-15 DIAGNOSIS — X500XXA Overexertion from strenuous movement or load, initial encounter: Secondary | ICD-10-CM | POA: Insufficient documentation

## 2011-05-24 ENCOUNTER — Emergency Department (HOSPITAL_COMMUNITY)
Admission: EM | Admit: 2011-05-24 | Discharge: 2011-05-24 | Disposition: A | Discharge status: Home / Self Care | Payer: Medicaid Other | Attending: Emergency Medicine | Admitting: Emergency Medicine

## 2011-05-24 DIAGNOSIS — Z79899 Other long term (current) drug therapy: Secondary | ICD-10-CM | POA: Insufficient documentation

## 2011-05-24 DIAGNOSIS — K219 Gastro-esophageal reflux disease without esophagitis: Secondary | ICD-10-CM | POA: Insufficient documentation

## 2011-05-24 DIAGNOSIS — IMO0002 Reserved for concepts with insufficient information to code with codable children: Secondary | ICD-10-CM | POA: Insufficient documentation

## 2011-05-24 DIAGNOSIS — X58XXXA Exposure to other specified factors, initial encounter: Secondary | ICD-10-CM | POA: Insufficient documentation

## 2011-07-16 ENCOUNTER — Ambulatory Visit: Payer: Medicaid Other | Attending: Orthopedic Surgery

## 2011-07-28 LAB — COMPREHENSIVE METABOLIC PANEL
ALT: 14
AST: 15
Albumin: 3.9
Alkaline Phosphatase: 53
BUN: 13
CO2: 25
Calcium: 8.8
Chloride: 110
Creatinine, Ser: 0.64
GFR calc Af Amer: >60
GFR calc non Af Amer: >60
Glucose, Bld: 70
Potassium: 3.5
Sodium: 139
Total Bilirubin: 0.9
Total Protein: 7.5

## 2011-07-28 LAB — DIFFERENTIAL
Basophils Absolute: 0.1
Basophils Relative: 1
Eosinophils Absolute: 0.1
Eosinophils Relative: 1
Lymphocytes Relative: 36
Lymphs Abs: 1.8
Monocytes Absolute: 0.4
Monocytes Relative: 9
Neutro Abs: 2.5
Neutrophils Relative %: 53

## 2011-07-28 LAB — CBC
HCT: 35.4 — ABNORMAL LOW
Hemoglobin: 11.2 — ABNORMAL LOW
MCHC: 31.6
MCV: 67.2 — ABNORMAL LOW
Platelets: 303
RBC: 5.27 — ABNORMAL HIGH
RDW: 16.4 — ABNORMAL HIGH
WBC: 4.9

## 2011-07-28 LAB — WET PREP, GENITAL
Trich, Wet Prep: NONE SEEN
Yeast Wet Prep HPF POC: NONE SEEN

## 2011-07-28 LAB — URINALYSIS, ROUTINE W REFLEX MICROSCOPIC
Bilirubin Urine: NEGATIVE
Glucose, UA: NEGATIVE
Hgb urine dipstick: NEGATIVE
Ketones, ur: NEGATIVE
Nitrite: NEGATIVE
Protein, ur: NEGATIVE
Specific Gravity, Urine: 1.035 — ABNORMAL HIGH
Urobilinogen, UA: 0.2
pH: 6

## 2011-07-28 LAB — PREGNANCY, URINE: Preg Test, Ur: NEGATIVE

## 2011-07-28 LAB — GC/CHLAMYDIA PROBE AMP, GENITAL
Chlamydia, DNA Probe: NEGATIVE
GC Probe Amp, Genital: NEGATIVE

## 2011-07-28 LAB — RPR: RPR Ser Ql: NONREACTIVE

## 2011-07-28 LAB — LIPASE, BLOOD: Lipase: 25

## 2011-08-03 LAB — BASIC METABOLIC PANEL
BUN: 11
CO2: 21
Calcium: 8.6
Chloride: 108
Creatinine, Ser: 0.57
GFR calc Af Amer: >60
GFR calc non Af Amer: >60
Glucose, Bld: 109 — ABNORMAL HIGH
Potassium: 3.9
Sodium: 138

## 2011-08-03 LAB — URINALYSIS, ROUTINE W REFLEX MICROSCOPIC
Bilirubin Urine: NEGATIVE
Glucose, UA: NEGATIVE
Hgb urine dipstick: NEGATIVE
Ketones, ur: NEGATIVE
Nitrite: NEGATIVE
Protein, ur: NEGATIVE
Specific Gravity, Urine: 1.022
Urobilinogen, UA: 1
pH: 6.5

## 2011-08-03 LAB — SAMPLE TO BLOOD BANK

## 2011-08-03 LAB — CBC
HCT: 34.1 — ABNORMAL LOW
Hemoglobin: 10.6 — ABNORMAL LOW
MCHC: 31
MCV: 68.7 — ABNORMAL LOW
Platelets: 274
RBC: 4.96
RDW: 15.9 — ABNORMAL HIGH
WBC: 10.2

## 2011-08-03 LAB — DIFFERENTIAL
Basophils Absolute: 0
Basophils Relative: 0
Eosinophils Absolute: 0.1
Eosinophils Relative: 1
Lymphocytes Relative: 19
Lymphs Abs: 1.9
Monocytes Absolute: 0.7
Monocytes Relative: 7
Neutro Abs: 7.5
Neutrophils Relative %: 74

## 2011-08-03 LAB — APTT: aPTT: 26

## 2011-08-03 LAB — URINE MICROSCOPIC-ADD ON

## 2011-08-03 LAB — PROTIME-INR
INR: 1
Prothrombin Time: 13.6

## 2011-08-04 LAB — URINALYSIS, ROUTINE W REFLEX MICROSCOPIC
Bilirubin Urine: NEGATIVE
Glucose, UA: NEGATIVE
Hgb urine dipstick: NEGATIVE
Ketones, ur: NEGATIVE
Leukocytes, UA: NEGATIVE
Nitrite: NEGATIVE
Protein, ur: 30 — AB
Specific Gravity, Urine: 1.038 — ABNORMAL HIGH
Urobilinogen, UA: 0.2
pH: 6

## 2011-08-04 LAB — URINE MICROSCOPIC-ADD ON

## 2011-08-04 LAB — PREGNANCY, URINE: Preg Test, Ur: NEGATIVE

## 2011-08-14 LAB — DIFFERENTIAL
Basophils Absolute: 0.1
Basophils Relative: 2 — ABNORMAL HIGH
Eosinophils Absolute: 0
Eosinophils Relative: 1
Lymphocytes Relative: 30
Lymphs Abs: 1.4
Monocytes Absolute: 0.6
Monocytes Relative: 12 — ABNORMAL HIGH
Neutro Abs: 2.6
Neutrophils Relative %: 55

## 2011-08-14 LAB — URINALYSIS, ROUTINE W REFLEX MICROSCOPIC
Bilirubin Urine: NEGATIVE
Glucose, UA: NEGATIVE
Hgb urine dipstick: NEGATIVE
Ketones, ur: NEGATIVE
Nitrite: NEGATIVE
Protein, ur: NEGATIVE
Specific Gravity, Urine: 1.023
Urobilinogen, UA: 0.2
pH: 6

## 2011-08-14 LAB — BASIC METABOLIC PANEL
BUN: 10
CO2: 28
Calcium: 9.1
Chloride: 106
Creatinine, Ser: 0.66
GFR calc Af Amer: >60
GFR calc non Af Amer: >60
Glucose, Bld: 74
Potassium: 3.4 — ABNORMAL LOW
Sodium: 141

## 2011-08-14 LAB — CBC
HCT: 33.1 — ABNORMAL LOW
Hemoglobin: 10.5 — ABNORMAL LOW
MCHC: 31.6
MCV: 67.3 — ABNORMAL LOW
Platelets: 234
RBC: 4.92
RDW: 15.7 — ABNORMAL HIGH
WBC: 4.7

## 2011-08-14 LAB — PREGNANCY, URINE: Preg Test, Ur: NEGATIVE

## 2011-08-20 ENCOUNTER — Other Ambulatory Visit: Payer: Self-pay | Admitting: Physician Assistant

## 2011-10-27 ENCOUNTER — Emergency Department (HOSPITAL_COMMUNITY)
Admission: EM | Admit: 2011-10-27 | Discharge: 2011-10-27 | Disposition: A | Discharge status: Home / Self Care | Payer: Medicaid Other | Attending: Emergency Medicine | Admitting: Emergency Medicine

## 2011-10-27 ENCOUNTER — Encounter: Payer: Self-pay | Admitting: *Deleted

## 2011-10-27 DIAGNOSIS — M545 Low back pain, unspecified: Secondary | ICD-10-CM | POA: Insufficient documentation

## 2011-10-27 MED ORDER — HYDROCODONE-ACETAMINOPHEN 5-325 MG PO TABS: 2.0000 | ORAL_TABLET | ORAL | Status: AC | PRN

## 2011-10-27 MED ORDER — IBUPROFEN 800 MG PO TABS
800.0000 mg | ORAL_TABLET | Freq: Once | ORAL | Status: AC
  Administered 2011-10-27: 800 mg via ORAL
  Filled 2011-10-27: qty 1

## 2011-10-27 MED ORDER — OXYCODONE-ACETAMINOPHEN 5-325 MG PO TABS
2.0000 | ORAL_TABLET | Freq: Once | ORAL | Status: AC
  Administered 2011-10-27: 2 via ORAL
  Filled 2011-10-27 (×2): qty 1

## 2011-10-27 MED ORDER — IBUPROFEN 800 MG PO TABS: 800.0000 mg | ORAL_TABLET | Freq: Three times a day (TID) | ORAL | Status: AC

## 2011-10-27 MED ORDER — CYCLOBENZAPRINE HCL 10 MG PO TABS: 10.0000 mg | ORAL_TABLET | Freq: Two times a day (BID) | ORAL | Status: AC | PRN

## 2011-10-27 NOTE — ED Notes (Signed)
Patient here with c/o lower back pain for about three days.  Patient is ambulatory at triage

## 2011-10-27 NOTE — ED Notes (Signed)
C/o lower back pain for 3 days. Pt. Denies numbness, tingling. Walking slowly.

## 2011-10-27 NOTE — ED Provider Notes (Signed)
History     CSN: [id number]  Arrival date & time 10/27/11  1727   First MD Initiated Contact with Patient 10/27/11 1754      Chief Complaint  Patient presents with  . Back Pain    (Consider location/radiation/quality/duration/timing/severity/associated sxs/prior treatment) HPI Comments: Patient presents with 3 days of lower back pain it radiates in her bilateral thighs. She denies any injury and does notice the pain issues getting out of bed 3 days ago. Pain improves with rest. She has not been taking any medication for it. She denies any fevers, vomiting, weakness, numbness, tingling, bowel or bladder incontinence. No previous history of back problems. She is scheduled for surgery on her right knee. She states she's not been taking any pain medication at home.  The history is provided by the patient.    History reviewed. No pertinent past medical history.  History reviewed. No pertinent past surgical history.  No family history on file.  History  Substance Use Topics  . Smoking status: Never Smoker   . Smokeless tobacco: Not on file  . Alcohol Use: Yes    OB History    Grav Para Term Preterm Abortions TAB SAB Ect Mult Living                  Review of Systems  Constitutional: Negative for fever, activity change and appetite change.  Respiratory: Negative for cough, chest tightness and shortness of breath.   Cardiovascular: Negative for chest pain.  Gastrointestinal: Negative for nausea, vomiting and abdominal pain.  Genitourinary: Negative for dysuria and hematuria.  Musculoskeletal: Positive for back pain. Negative for gait problem.  Neurological: Negative for weakness and headaches.    Allergies  Review of patient's allergies indicates no known allergies.  Home Medications   Current Outpatient Rx  Name Route Sig Dispense Refill  . OMEPRAZOLE 20 MG PO CPDR Oral Take 20 mg by mouth daily as needed. For acid reflux     . CYCLOBENZAPRINE HCL 10 MG PO TABS Oral  Take 1 tablet (10 mg total) by mouth 2 (two) times daily as needed for muscle spasms. 20 tablet 0  . HYDROCODONE-ACETAMINOPHEN 5-325 MG PO TABS Oral Take 2 tablets by mouth every 4 (four) hours as needed for pain. 10 tablet 0  . IBUPROFEN 800 MG PO TABS Oral Take 1 tablet (800 mg total) by mouth 3 (three) times daily. 21 tablet 0    BP 131/93  Pulse 82  Temp(Src) 98.3 F (36.8 C) (Oral)  Resp 16  Physical Exam  Constitutional: She is oriented to person, place, and time. She appears well-developed and well-nourished. No distress.  HENT:  Head: Normocephalic and atraumatic.  Mouth/Throat: Oropharynx is clear and moist. No oropharyngeal exudate.  Eyes: Conjunctivae are normal. Pupils are equal, round, and reactive to light.  Neck: Normal range of motion. Neck supple.  Cardiovascular: Normal rate, regular rhythm and normal heart sounds.   Pulmonary/Chest: Effort normal and breath sounds normal. No respiratory distress.  Abdominal: Soft. There is no tenderness. There is no rebound and no guarding.  Musculoskeletal: She exhibits tenderness.       Bilateral lumbar paraspinal tenderness, no midline tenderness  Neurological: She is alert and oriented to person, place, and time. No cranial nerve deficit.       5 Out of 5 strength in bilateral lower showing his, +2 patellar reflexes bilaterally able to extend great toes dorsally, +2 DP and PT pulses bilaterally  Skin: Skin is warm.  ED Course  Procedures (including critical care time)  Labs Reviewed - No data to display No results found.   1. Low back pain       MDM  Muscle skeletal back pain with normal neurological exam. Patient is ambulatory with a normal antalgic gait because of her knee pain. She is not have any neurological deficit or other red flags.  Partial pain relief achieved in ED.   We'll discharge with anti-inflammatories, muscle relaxers for outpatient followup.       Glynn Octave, MD 10/27/11 1900

## 2011-12-04 HISTORY — PX: JOINT REPLACEMENT: SHX530

## 2011-12-16 ENCOUNTER — Other Ambulatory Visit (HOSPITAL_COMMUNITY): Payer: Self-pay | Admitting: Orthopaedic Surgery

## 2011-12-22 ENCOUNTER — Encounter (HOSPITAL_COMMUNITY): Payer: Self-pay | Admitting: Pharmacy Technician

## 2011-12-23 ENCOUNTER — Inpatient Hospital Stay (HOSPITAL_COMMUNITY): Admission: RE | Admit: 2011-12-23 | Payer: Medicaid Other | Source: Ambulatory Visit

## 2011-12-24 ENCOUNTER — Encounter (HOSPITAL_COMMUNITY)
Admission: RE | Admit: 2011-12-24 | Discharge: 2011-12-24 | Disposition: A | Discharge status: Home / Self Care | Payer: Medicaid Other | Source: Ambulatory Visit | Attending: Orthopaedic Surgery | Admitting: Orthopaedic Surgery

## 2011-12-24 ENCOUNTER — Encounter (HOSPITAL_COMMUNITY): Payer: Self-pay

## 2011-12-24 HISTORY — PX: KNEE ARTHROSCOPY: SUR90

## 2011-12-24 LAB — CBC
HCT: 37.3 % (ref 36.0–46.0)
Hemoglobin: 12 g/dL (ref 12.0–15.0)
MCH: 21.9 pg — ABNORMAL LOW (ref 26.0–34.0)
MCHC: 32.2 g/dL (ref 30.0–36.0)
MCV: 68.1 fL — ABNORMAL LOW (ref 78.0–100.0)
Platelets: 201 10*3/uL (ref 150–400)
RBC: 5.48 MIL/uL — ABNORMAL HIGH (ref 3.87–5.11)
RDW: 14.9 % (ref 11.5–15.5)
WBC: 4.9 10*3/uL (ref 4.0–10.5)

## 2011-12-24 LAB — SURGICAL PCR SCREEN
MRSA, PCR: NEGATIVE
Staphylococcus aureus: NEGATIVE

## 2011-12-24 LAB — URINALYSIS, ROUTINE W REFLEX MICROSCOPIC
Bilirubin Urine: NEGATIVE
Glucose, UA: NEGATIVE mg/dL
Hgb urine dipstick: NEGATIVE
Ketones, ur: NEGATIVE mg/dL
Leukocytes, UA: NEGATIVE
Nitrite: NEGATIVE
Protein, ur: NEGATIVE mg/dL
Specific Gravity, Urine: 1.024 (ref 1.005–1.030)
Urobilinogen, UA: 0.2 mg/dL (ref 0.0–1.0)
pH: 6.5 (ref 5.0–8.0)

## 2011-12-24 LAB — BASIC METABOLIC PANEL
BUN: 11 mg/dL (ref 6–23)
CO2: 25 mEq/L (ref 19–32)
Calcium: 9.2 mg/dL (ref 8.4–10.5)
Chloride: 104 mEq/L (ref 96–112)
Creatinine, Ser: 0.49 mg/dL — ABNORMAL LOW (ref 0.50–1.10)
GFR calc Af Amer: >90 mL/min (ref >90)
GFR calc non Af Amer: >90 mL/min (ref >90)
Glucose, Bld: 85 mg/dL (ref 70–99)
Potassium: 3.5 mEq/L (ref 3.5–5.1)
Sodium: 138 mEq/L (ref 135–145)

## 2011-12-24 LAB — HCG, SERUM, QUALITATIVE: Preg, Serum: NEGATIVE

## 2011-12-24 LAB — ABO/RH: ABO/RH(D): A POS

## 2011-12-24 LAB — TYPE AND SCREEN
ABO/RH(D): A POS
Antibody Screen: NEGATIVE

## 2011-12-24 NOTE — Patient Instructions (Signed)
20 [name]  12/24/2011   Your procedure is scheduled on: 12-25-11  Report to [facility] at     1030   AM.  Call this number if you have problems the morning of surgery: [phone]   Remember:   Do not eat food:After Midnight.  May have clear liquids:up to 6 Hours before arrival. Nothing after : 0700 am   Clear liquids include soda, tea, black coffee, apple or grape juice, broth.  Take these medicines the morning of surgery with A SIP OF WATER: Hydrocodone, Ultram.   Do not wear jewelry, make-up or nail polish.  Do not wear lotions, powders, or perfumes. You may wear deodorant.  Do not shave 48 hours prior to surgery.(no shaving of legs)  Do not bring valuables to the hospital.  Contacts, dentures or bridgework may not be worn into surgery.  Leave suitcase in the car. After surgery it may be brought to your room.  For patients admitted to the hospital, checkout time is 11:00 AM the day of discharge.   Patients discharged the day of surgery will not be allowed to drive home.  Name and phone number of your driver: boyfriend  Special Instructions: CHG Shower Use Special Wash: 1/2 bottle night before surgery and 1/2 bottle morning of surgery.(avoid face and vagina)   Please read over the following fact sheets that you were given: MRSA Information, blood transfusion fact sheet.

## 2011-12-25 ENCOUNTER — Ambulatory Visit (HOSPITAL_COMMUNITY): Payer: Medicaid Other | Admitting: Anesthesiology

## 2011-12-25 ENCOUNTER — Inpatient Hospital Stay (HOSPITAL_COMMUNITY)
Admission: RE | Admit: 2011-12-25 | Discharge: 2011-12-29 | DRG: 470 | Disposition: A | Discharge status: Home Health | Payer: Medicaid Other | Source: Ambulatory Visit | Attending: Orthopaedic Surgery | Admitting: Orthopaedic Surgery

## 2011-12-25 ENCOUNTER — Encounter (HOSPITAL_COMMUNITY): Payer: Self-pay | Admitting: *Deleted

## 2011-12-25 ENCOUNTER — Encounter (HOSPITAL_COMMUNITY): Payer: Self-pay | Admitting: Anesthesiology

## 2011-12-25 ENCOUNTER — Ambulatory Visit (HOSPITAL_COMMUNITY): Payer: Medicaid Other

## 2011-12-25 ENCOUNTER — Encounter (HOSPITAL_COMMUNITY)
Admission: RE | Disposition: A | Discharge status: Home Health | Payer: Self-pay | Source: Ambulatory Visit | Attending: Orthopaedic Surgery

## 2011-12-25 DIAGNOSIS — M659 Unspecified synovitis and tenosynovitis, unspecified site: Secondary | ICD-10-CM | POA: Diagnosis present

## 2011-12-25 DIAGNOSIS — Z01812 Encounter for preprocedural laboratory examination: Secondary | ICD-10-CM

## 2011-12-25 DIAGNOSIS — K219 Gastro-esophageal reflux disease without esophagitis: Secondary | ICD-10-CM | POA: Diagnosis present

## 2011-12-25 DIAGNOSIS — M1710 Unilateral primary osteoarthritis, unspecified knee: Principal | ICD-10-CM | POA: Diagnosis present

## 2011-12-25 HISTORY — PX: KNEE ARTHROPLASTY: SHX992

## 2011-12-25 SURGERY — ARTHROPLASTY, KNEE, TOTAL, USING IMAGELESS COMPUTER-ASSISTED NAVIGATION
Anesthesia: Spinal | Site: Knee | Laterality: Right | Wound class: Clean

## 2011-12-25 MED ORDER — METOCLOPRAMIDE HCL 5 MG/ML IJ SOLN: 5.0000 mg | Freq: Three times a day (TID) | INTRAMUSCULAR | Status: DC | PRN

## 2011-12-25 MED ORDER — RIVAROXABAN 10 MG PO TABS
10.0000 mg | ORAL_TABLET | Freq: Every day | ORAL | Status: DC
  Administered 2011-12-26 – 2011-12-29 (×4): 10 mg via ORAL
  Filled 2011-12-25 (×4): qty 1

## 2011-12-25 MED ORDER — SCOPOLAMINE 1 MG/3DAYS TD PT72
MEDICATED_PATCH | TRANSDERMAL | Status: DC | PRN
  Administered 2011-12-25: 1 via TRANSDERMAL

## 2011-12-25 MED ORDER — ONDANSETRON HCL 4 MG/2ML IJ SOLN: 4.0000 mg | Freq: Four times a day (QID) | INTRAMUSCULAR | Status: DC | PRN

## 2011-12-25 MED ORDER — BUPIVACAINE HCL (PF) 0.5 % IJ SOLN
INTRAMUSCULAR | Status: AC
  Filled 2011-12-25: qty 30

## 2011-12-25 MED ORDER — PROPOFOL 10 MG/ML IV EMUL
INTRAVENOUS | Status: DC | PRN
  Administered 2011-12-25: 120 ug/kg/min via INTRAVENOUS

## 2011-12-25 MED ORDER — MIDAZOLAM HCL 5 MG/5ML IJ SOLN
INTRAMUSCULAR | Status: DC | PRN
  Administered 2011-12-25: 2 mg via INTRAVENOUS
  Administered 2011-12-25 (×2): 1 mg via INTRAVENOUS

## 2011-12-25 MED ORDER — CEFAZOLIN SODIUM 1-5 GM-% IV SOLN
1.0000 g | Freq: Four times a day (QID) | INTRAVENOUS | Status: AC
  Administered 2011-12-25 – 2011-12-26 (×3): 1 g via INTRAVENOUS
  Filled 2011-12-25 (×3): qty 50

## 2011-12-25 MED ORDER — DIPHENHYDRAMINE HCL 12.5 MG/5ML PO ELIX
12.5000 mg | ORAL_SOLUTION | ORAL | Status: DC | PRN
Start: 2011-12-25 — End: 2011-12-29
  Administered 2011-12-26 – 2011-12-29 (×3): 12.5 mg via ORAL
  Filled 2011-12-25 (×3): qty 5

## 2011-12-25 MED ORDER — BUPIVACAINE LIPOSOME 1.3 % IJ SUSP
20.0000 mL | Freq: Once | INTRAMUSCULAR | Status: DC
  Filled 2011-12-25: qty 20

## 2011-12-25 MED ORDER — SODIUM CHLORIDE 0.9 % IR SOLN
Status: DC | PRN
  Administered 2011-12-25: 1000 mL

## 2011-12-25 MED ORDER — METOCLOPRAMIDE HCL 10 MG PO TABS: 5.0000 mg | ORAL_TABLET | Freq: Three times a day (TID) | ORAL | Status: DC | PRN

## 2011-12-25 MED ORDER — HYDROMORPHONE HCL PF 1 MG/ML IJ SOLN: 0.2500 mg | INTRAMUSCULAR | Status: DC | PRN

## 2011-12-25 MED ORDER — DIPHENHYDRAMINE HCL 50 MG/ML IJ SOLN: 12.5000 mg | Freq: Four times a day (QID) | INTRAMUSCULAR | Status: DC | PRN

## 2011-12-25 MED ORDER — CEFAZOLIN SODIUM 1-5 GM-% IV SOLN
1.0000 g | INTRAVENOUS | Status: AC
  Administered 2011-12-25: 1 g via INTRAVENOUS

## 2011-12-25 MED ORDER — LACTATED RINGERS IV SOLN: INTRAVENOUS | Status: DC

## 2011-12-25 MED ORDER — NALOXONE HCL 0.4 MG/ML IJ SOLN: 0.4000 mg | INTRAMUSCULAR | Status: DC | PRN

## 2011-12-25 MED ORDER — MEPERIDINE HCL 25 MG/ML IJ SOLN: 6.2500 mg | INTRAMUSCULAR | Status: DC | PRN

## 2011-12-25 MED ORDER — KETAMINE HCL 10 MG/ML IJ SOLN
INTRAMUSCULAR | Status: DC | PRN
  Administered 2011-12-25: 20 mg via INTRAVENOUS
  Administered 2011-12-25: 10 mg via INTRAVENOUS
  Administered 2011-12-25: 20 mg via INTRAVENOUS

## 2011-12-25 MED ORDER — MORPHINE SULFATE 2 MG/ML IJ SOLN: 1.0000 mg | INTRAMUSCULAR | Status: DC | PRN

## 2011-12-25 MED ORDER — SODIUM CHLORIDE 0.9 % IJ SOLN: 9.0000 mL | INTRAMUSCULAR | Status: DC | PRN

## 2011-12-25 MED ORDER — PROMETHAZINE HCL 25 MG/ML IJ SOLN: 6.2500 mg | INTRAMUSCULAR | Status: DC | PRN

## 2011-12-25 MED ORDER — ACETAMINOPHEN 325 MG PO TABS
650.0000 mg | ORAL_TABLET | Freq: Four times a day (QID) | ORAL | Status: DC | PRN
  Administered 2011-12-27 – 2011-12-29 (×4): 650 mg via ORAL
  Filled 2011-12-25 (×4): qty 2

## 2011-12-25 MED ORDER — ACETAMINOPHEN 10 MG/ML IV SOLN
INTRAVENOUS | Status: DC | PRN
  Administered 2011-12-25: 1000 mg via INTRAVENOUS

## 2011-12-25 MED ORDER — MORPHINE SULFATE (PF) 1 MG/ML IV SOLN
INTRAVENOUS | Status: DC
  Administered 2011-12-25: 1 mg via INTRAVENOUS
  Administered 2011-12-25: 9 mg via INTRAVENOUS
  Administered 2011-12-25: 22:00:00 via INTRAVENOUS
  Administered 2011-12-26: 11 mg via INTRAVENOUS
  Administered 2011-12-26: 2 mg via INTRAVENOUS
  Administered 2011-12-26: 9 mg via INTRAVENOUS
  Administered 2011-12-26: 23 mg via INTRAVENOUS
  Administered 2011-12-26: 8 mg via INTRAVENOUS
  Administered 2011-12-26 – 2011-12-27 (×2): 2 mg via INTRAVENOUS
  Administered 2011-12-27: 15 mg via INTRAVENOUS
  Administered 2011-12-27: 10 mg via INTRAVENOUS
  Filled 2011-12-25 (×3): qty 25

## 2011-12-25 MED ORDER — ACETAMINOPHEN 10 MG/ML IV SOLN
INTRAVENOUS | Status: AC
  Filled 2011-12-25: qty 100

## 2011-12-25 MED ORDER — MENTHOL 3 MG MT LOZG: 1.0000 | LOZENGE | OROMUCOSAL | Status: DC | PRN

## 2011-12-25 MED ORDER — OXYCODONE HCL 5 MG PO TABS
5.0000 mg | ORAL_TABLET | ORAL | Status: DC | PRN
  Administered 2011-12-25 (×2): 5 mg via ORAL
  Administered 2011-12-26 – 2011-12-29 (×19): 10 mg via ORAL
  Filled 2011-12-25 (×6): qty 2
  Filled 2011-12-25: qty 1
  Filled 2011-12-25 (×7): qty 2
  Filled 2011-12-25: qty 1
  Filled 2011-12-25 (×6): qty 2

## 2011-12-25 MED ORDER — HETASTARCH-ELECTROLYTES 6 % IV SOLN
INTRAVENOUS | Status: DC | PRN
  Administered 2011-12-25: 15:00:00 via INTRAVENOUS

## 2011-12-25 MED ORDER — CEFAZOLIN SODIUM 1-5 GM-% IV SOLN
INTRAVENOUS | Status: AC
  Filled 2011-12-25: qty 50

## 2011-12-25 MED ORDER — ONDANSETRON HCL 4 MG PO TABS: 4.0000 mg | ORAL_TABLET | Freq: Four times a day (QID) | ORAL | Status: DC | PRN

## 2011-12-25 MED ORDER — PROPOFOL 10 MG/ML IV BOLUS
INTRAVENOUS | Status: DC | PRN
  Administered 2011-12-25: 40 mg via INTRAVENOUS

## 2011-12-25 MED ORDER — SCOPOLAMINE 1 MG/3DAYS TD PT72
MEDICATED_PATCH | TRANSDERMAL | Status: AC
  Filled 2011-12-25: qty 1

## 2011-12-25 MED ORDER — BUPIVACAINE LIPOSOME 1.3 % IJ SUSP
INTRAMUSCULAR | Status: DC | PRN
  Administered 2011-12-25: 20 mL

## 2011-12-25 MED ORDER — METHOCARBAMOL 500 MG PO TABS
500.0000 mg | ORAL_TABLET | Freq: Four times a day (QID) | ORAL | Status: DC | PRN
  Administered 2011-12-26 – 2011-12-28 (×5): 500 mg via ORAL
  Filled 2011-12-25 (×5): qty 1

## 2011-12-25 MED ORDER — ONDANSETRON HCL 4 MG/2ML IJ SOLN
4.0000 mg | Freq: Once | INTRAMUSCULAR | Status: AC
  Administered 2011-12-25 (×2): 4 mg via INTRAVENOUS

## 2011-12-25 MED ORDER — PHENOL 1.4 % MT LIQD: 1.0000 | OROMUCOSAL | Status: DC | PRN

## 2011-12-25 MED ORDER — ALUM & MAG HYDROXIDE-SIMETH 200-200-20 MG/5ML PO SUSP: 30.0000 mL | ORAL | Status: DC | PRN

## 2011-12-25 MED ORDER — FENTANYL CITRATE 0.05 MG/ML IJ SOLN
INTRAMUSCULAR | Status: DC | PRN
  Administered 2011-12-25: 100 ug via INTRAVENOUS
  Administered 2011-12-25 (×3): 50 ug via INTRAVENOUS

## 2011-12-25 MED ORDER — HYDROCODONE-ACETAMINOPHEN 5-325 MG PO TABS
1.0000 | ORAL_TABLET | ORAL | Status: DC | PRN
  Administered 2011-12-25: 2 via ORAL
  Filled 2011-12-25: qty 2

## 2011-12-25 MED ORDER — DOCUSATE SODIUM 100 MG PO CAPS
100.0000 mg | ORAL_CAPSULE | Freq: Two times a day (BID) | ORAL | Status: DC
  Administered 2011-12-25 – 2011-12-29 (×8): 100 mg via ORAL
  Filled 2011-12-25 (×9): qty 1

## 2011-12-25 MED ORDER — SODIUM CHLORIDE 0.9 % IV SOLN
INTRAVENOUS | Status: DC
  Administered 2011-12-26: 03:00:00 via INTRAVENOUS
  Administered 2011-12-26: 1000 mL via INTRAVENOUS

## 2011-12-25 MED ORDER — MORPHINE SULFATE (PF) 1 MG/ML IV SOLN
INTRAVENOUS | Status: AC
  Administered 2011-12-25: 1 mg via INTRAVENOUS
  Filled 2011-12-25: qty 25

## 2011-12-25 MED ORDER — DEXAMETHASONE SODIUM PHOSPHATE 10 MG/ML IJ SOLN
INTRAMUSCULAR | Status: DC | PRN
  Administered 2011-12-25: 10 mg via INTRAVENOUS

## 2011-12-25 MED ORDER — PANTOPRAZOLE SODIUM 40 MG PO TBEC
40.0000 mg | DELAYED_RELEASE_TABLET | Freq: Every day | ORAL | Status: DC
  Administered 2011-12-25 – 2011-12-29 (×5): 40 mg via ORAL
  Filled 2011-12-25 (×5): qty 1

## 2011-12-25 MED ORDER — LACTATED RINGERS IV SOLN
INTRAVENOUS | Status: DC
  Administered 2011-12-25: 1000 mL via INTRAVENOUS
  Administered 2011-12-25 (×2): via INTRAVENOUS

## 2011-12-25 MED ORDER — ACETAMINOPHEN 650 MG RE SUPP: 650.0000 mg | Freq: Four times a day (QID) | RECTAL | Status: DC | PRN

## 2011-12-25 MED ORDER — ZOLPIDEM TARTRATE 5 MG PO TABS
5.0000 mg | ORAL_TABLET | Freq: Every evening | ORAL | Status: DC | PRN
  Administered 2011-12-26 – 2011-12-28 (×3): 5 mg via ORAL
  Filled 2011-12-25 (×3): qty 1

## 2011-12-25 MED ORDER — DIPHENHYDRAMINE HCL 12.5 MG/5ML PO ELIX: 12.5000 mg | ORAL_SOLUTION | Freq: Four times a day (QID) | ORAL | Status: DC | PRN

## 2011-12-25 MED ORDER — BUPIVACAINE HCL (PF) 0.5 % IJ SOLN
INTRAMUSCULAR | Status: DC | PRN
  Administered 2011-12-25: 3 mL

## 2011-12-25 MED ORDER — METHOCARBAMOL 100 MG/ML IJ SOLN
500.0000 mg | Freq: Four times a day (QID) | INTRAVENOUS | Status: DC | PRN
  Administered 2011-12-25 – 2011-12-26 (×3): 500 mg via INTRAVENOUS
  Filled 2011-12-25 (×4): qty 5

## 2011-12-25 SURGICAL SUPPLY — 62 items
BAG ZIPLOCK 12X15 (MISCELLANEOUS) ×2 IMPLANT
BANDAGE ELASTIC 6 VELCRO ST LF (GAUZE/BANDAGES/DRESSINGS) ×2 IMPLANT
BANDAGE ESMARK 6X9 LF (GAUZE/BANDAGES/DRESSINGS) ×1 IMPLANT
BLADE SAG 18X100X1.27 (BLADE) ×2 IMPLANT
BLADE SAW SGTL 13.0X1.19X90.0M (BLADE) IMPLANT
BLADE SURG SZ11 CARB STEEL (BLADE) IMPLANT
BNDG CMPR 9X6 STRL LF SNTH (GAUZE/BANDAGES/DRESSINGS) ×1
BNDG ESMARK 6X9 LF (GAUZE/BANDAGES/DRESSINGS) ×2
BOWL SMART MIX CTS (DISPOSABLE) ×2 IMPLANT
CEMENT BONE 1-PACK (Cement) ×4 IMPLANT
CLOTH BEACON ORANGE TIMEOUT ST (SAFETY) ×2 IMPLANT
COVER SURGICAL LIGHT HANDLE (MISCELLANEOUS) ×2 IMPLANT
CUFF TOURN SGL QUICK 34 (TOURNIQUET CUFF) ×1
CUFF TRNQT CYL 34X4X40X1 (TOURNIQUET CUFF) ×1 IMPLANT
DECANTER SPIKE VIAL GLASS SM (MISCELLANEOUS) ×2 IMPLANT
DRAPE EXTREMITY T 121X128X90 (DRAPE) ×2 IMPLANT
DRAPE LG THREE QUARTER DISP (DRAPES) IMPLANT
DRAPE POUCH INSTRU U-SHP 10X18 (DRAPES) ×2 IMPLANT
DRAPE U-SHAPE 47X51 STRL (DRAPES) ×2 IMPLANT
DRSG ADAPTIC 3X8 NADH LF (GAUZE/BANDAGES/DRESSINGS) ×2 IMPLANT
DRSG PAD ABDOMINAL 8X10 ST (GAUZE/BANDAGES/DRESSINGS) ×2 IMPLANT
DURAPREP 26ML APPLICATOR (WOUND CARE) ×2 IMPLANT
ELECT REM PT RETURN 9FT ADLT (ELECTROSURGICAL) ×2
ELECTRODE REM PT RTRN 9FT ADLT (ELECTROSURGICAL) ×1 IMPLANT
EVACUATOR 1/8 PVC DRAIN (DRAIN) ×2 IMPLANT
FACESHIELD LNG OPTICON STERILE (SAFETY) ×8 IMPLANT
GAUZE SPONGE 4X4 12PLY STRL LF (GAUZE/BANDAGES/DRESSINGS) ×2 IMPLANT
GAUZE XEROFORM 5X9 LF (GAUZE/BANDAGES/DRESSINGS) ×2 IMPLANT
GLOVE BIO SURGEON STRL SZ7.5 (GLOVE) ×2 IMPLANT
GOWN STRL REIN XL XLG (GOWN DISPOSABLE) ×2 IMPLANT
HANDPIECE INTERPULSE COAX TIP (DISPOSABLE) ×2
IMMOBILIZER KNEE 20 (SOFTGOODS) ×2
IMMOBILIZER KNEE 20 THIGH 36 (SOFTGOODS) ×1 IMPLANT
KIT BASIN OR (CUSTOM PROCEDURE TRAY) ×2 IMPLANT
NDL SAFETY ECLIPSE 18X1.5 (NEEDLE) ×1 IMPLANT
NEEDLE HYPO 18GX1.5 SHARP (NEEDLE) ×2
NS IRRIG 1000ML POUR BTL (IV SOLUTION) ×2 IMPLANT
PACK TOTAL JOINT (CUSTOM PROCEDURE TRAY) ×2 IMPLANT
PADDING CAST COTTON 6X4 STRL (CAST SUPPLIES) ×2 IMPLANT
PADDING WEBRIL 6 STERILE (GAUZE/BANDAGES/DRESSINGS) ×2 IMPLANT
POSITIONER SURGICAL ARM (MISCELLANEOUS) ×2 IMPLANT
SET HNDPC FAN SPRY TIP SCT (DISPOSABLE) ×1 IMPLANT
SET PAD KNEE POSITIONER (MISCELLANEOUS) ×2 IMPLANT
SPONGE GAUZE 4X4 12PLY (GAUZE/BANDAGES/DRESSINGS) IMPLANT
SPONGE LAP 18X18 X RAY DECT (DISPOSABLE) IMPLANT
STAPLER VISISTAT 35W (STAPLE) ×2 IMPLANT
STRIP CLOSURE SKIN 1/2X4 (GAUZE/BANDAGES/DRESSINGS) IMPLANT
SUCTION FRAZIER 12FR DISP (SUCTIONS) ×2 IMPLANT
SUT ETHILON 3 0 PS 1 (SUTURE) IMPLANT
SUT VIC AB 0 CT1 27 (SUTURE)
SUT VIC AB 0 CT1 27XBRD ANTBC (SUTURE) IMPLANT
SUT VIC AB 1 CT1 27 (SUTURE)
SUT VIC AB 1 CT1 27XBRD ANTBC (SUTURE) IMPLANT
SUT VIC AB 2-0 CT1 27 (SUTURE) ×3
SUT VIC AB 2-0 CT1 TAPERPNT 27 (SUTURE) ×3 IMPLANT
SUT VIC AB 4-0 PS2 27 (SUTURE) IMPLANT
SUT VLOC 180 0 24IN GS25 (SUTURE) ×2 IMPLANT
SYR 50ML LL SCALE MARK (SYRINGE) ×2 IMPLANT
TOWEL OR 17X26 10 PK STRL BLUE (TOWEL DISPOSABLE) ×6 IMPLANT
TRAY FOLEY CATH 14FRSI W/METER (CATHETERS) ×2 IMPLANT
WATER STERILE IRR 1500ML POUR (IV SOLUTION) ×2 IMPLANT
WRAP KNEE MAXI GEL POST OP (GAUZE/BANDAGES/DRESSINGS) ×2 IMPLANT

## 2011-12-25 NOTE — Anesthesia Preprocedure Evaluation (Addendum)
Anesthesia Evaluation  Patient identified by MRN, date of birth, ID band Patient awake    Reviewed: Allergy & Precautions, H&P , NPO status , Patient's Chart, lab work & pertinent test results  History of Anesthesia Complications (+) PONV  Airway Mallampati: II TM Distance: >3 FB Neck ROM: Full    Dental No notable dental hx.    Pulmonary neg pulmonary ROS,  clear to auscultation  Pulmonary exam normal       Cardiovascular neg cardio ROS Regular Normal    Neuro/Psych Negative Neurological ROS  Negative Psych ROS   GI/Hepatic negative GI ROS, Neg liver ROS, PUD, GERD-  Medicated and Controlled,  Endo/Other  Negative Endocrine ROS  Renal/GU negative Renal ROS  Genitourinary negative   Musculoskeletal negative musculoskeletal ROS (+)   Abdominal   Peds negative pediatric ROS (+)  Hematology negative hematology ROS (+)   Anesthesia Other Findings   Reproductive/Obstetrics negative OB ROS                          Anesthesia Physical Anesthesia Plan  ASA: II  Anesthesia Plan: Spinal   Post-op Pain Management:    Induction: Intravenous  Airway Management Planned:   Additional Equipment:   Intra-op Plan:   Post-operative Plan: Extubation in OR  Informed Consent: I have reviewed the patients History and Physical, chart, labs and discussed the procedure including the risks, benefits and alternatives for the proposed anesthesia with the patient or authorized representative who has indicated his/her understanding and acceptance.   Dental advisory given  Plan Discussed with: CRNA  Anesthesia Plan Comments:        Anesthesia Quick Evaluation

## 2011-12-25 NOTE — Transfer of Care (Signed)
Immediate Anesthesia Transfer of Care Note  Patient: Deanna Rodgers  Procedure(s) Performed: Procedure(s) (LRB): COMPUTER ASSISTED TOTAL KNEE ARTHROPLASTY (Right)  Patient Location: PACU  Anesthesia Type: Regional  Level of Consciousness: awake, alert  and oriented  Airway & Oxygen Therapy: Patient Spontanous Breathing  Post-op Assessment: Report given to PACU RN and Post -op Vital signs reviewed and stable  Post vital signs: Reviewed and stable  Complications: No apparent anesthesia complications SAB T12

## 2011-12-25 NOTE — H&P (Signed)
Deanna Rodgers is an 46 y.o. female.   Chief Complaint:   Severe right knee pain. HPI:   46 yo with cartilage damage to her right knee with severe pain and recurrent effusions.  Has undergone 2 knee arthroscopies and also steroid injections and suppliment injections.  None of these have helped.  Her quality of life is poor and her mobility severely limited.  She wishes to proceed with a right total knee replacement.  She understands fully the risk of infection, fracture, nerve and vessel injury, blood loss, DVT, and PE.  The goals are decreased pain and improved mobility/quality of life.  No past medical history on file.  Past Surgical History  Procedure Date  . Cesarean section 12-24-11    '85- x1  . Knee arthroscopy 12-24-11    x2 -[facility] 12'[facility]  . Tubal ligation     No family history on file. Social History:  reports that she has never smoked. She does not have any smokeless tobacco history on file. She reports that she drinks alcohol. She reports that she does not use illicit drugs.  Allergies: No Known Allergies  No current facility-administered medications on file as of .   Medications Prior to Admission  Medication Sig Dispense Refill  . omeprazole (PRILOSEC) 20 MG capsule Take 20 mg by mouth daily.         Results for orders placed during the hospital encounter of 12/24/11 (from the past 48 hour(s))  SURGICAL PCR SCREEN     Status: Normal   Collection Time   12/24/11  2:46 PM      Component Value Range Comment   MRSA, PCR NEGATIVE  NEGATIVE     Staphylococcus aureus NEGATIVE  NEGATIVE    URINALYSIS, ROUTINE W REFLEX MICROSCOPIC     Status: Normal   Collection Time   12/24/11  2:47 PM      Component Value Range Comment   Color, Urine YELLOW  YELLOW     APPearance CLEAR  CLEAR     Specific Gravity, Urine 1.024  1.005 - 1.030     pH 6.5  5.0 - 8.0     Glucose, UA NEGATIVE  NEGATIVE (mg/dL)    Hgb urine dipstick NEGATIVE  NEGATIVE     Bilirubin Urine  NEGATIVE  NEGATIVE     Ketones, ur NEGATIVE  NEGATIVE (mg/dL)    Protein, ur NEGATIVE  NEGATIVE (mg/dL)    Urobilinogen, UA 0.2  0.0 - 1.0 (mg/dL)    Nitrite NEGATIVE  NEGATIVE     Leukocytes, UA NEGATIVE  NEGATIVE  MICROSCOPIC NOT DONE ON URINES WITH NEGATIVE PROTEIN, BLOOD, LEUKOCYTES, NITRITE, OR GLUCOSE <1000 mg/dL.  HCG, SERUM, QUALITATIVE     Status: Normal   Collection Time   12/24/11  3:05 PM      Component Value Range Comment   Preg, Serum NEGATIVE  NEGATIVE    BASIC METABOLIC PANEL     Status: Abnormal   Collection Time   12/24/11  3:05 PM      Component Value Range Comment   Sodium 138  135 - 145 (mEq/L)    Potassium 3.5  3.5 - 5.1 (mEq/L)    Chloride 104  96 - 112 (mEq/L)    CO2 25  19 - 32 (mEq/L)    Glucose, Bld 85  70 - 99 (mg/dL)    BUN 11  6 - 23 (mg/dL)    Creatinine, Ser 1.61 (*) 0.50 - 1.10 (mg/dL)    Calcium  9.2  8.4 - 10.5 (mg/dL)    GFR calc non Af Amer >90  >90 (mL/min)    GFR calc Af Amer >90  >90 (mL/min)   CBC     Status: Abnormal   Collection Time   12/24/11  3:05 PM      Component Value Range Comment   WBC 4.9  4.0 - 10.5 (K/uL)    RBC 5.48 (*) 3.87 - 5.11 (MIL/uL)    Hemoglobin 12.0  12.0 - 15.0 (g/dL)    HCT 16.1  09.6 - 04.5 (%)    MCV 68.1 (*) 78.0 - 100.0 (fL)    MCH 21.9 (*) 26.0 - 34.0 (pg)    MCHC 32.2  30.0 - 36.0 (g/dL)    RDW 40.9  81.1 - 91.4 (%)    Platelets 201  150 - 400 (K/uL)   TYPE AND SCREEN     Status: Normal   Collection Time   12/24/11  3:05 PM      Component Value Range Comment   ABO/RH(D) A POS      Antibody Screen NEG      Sample Expiration 12/27/2011     ABO/RH     Status: Normal   Collection Time   12/24/11  3:30 PM      Component Value Range Comment   ABO/RH(D) A POS      No results found.  Review of Systems  All other systems reviewed and are negative.    There were no vitals taken for this visit. Physical Exam  Constitutional: She is oriented to person, place, and time. She appears well-developed and  well-nourished.  HENT:  Head: Normocephalic and atraumatic.  Eyes: EOM are normal. Pupils are equal, round, and reactive to light.  Neck: Normal range of motion. Neck supple.  Cardiovascular: Normal rate and regular rhythm.   Respiratory: Effort normal.  GI: Soft. Bowel sounds are normal.  Musculoskeletal:       Right knee: She exhibits decreased range of motion, swelling, effusion and bony tenderness. tenderness found. Medial joint line and lateral joint line tenderness noted.  Neurological: She is alert and oriented to person, place, and time.  Skin: Skin is warm and dry.  Psychiatric: She has a normal mood and affect.     Assessment/Plan  To the OR today for a right total knee replacement.  [name] 12/25/2011, 9:46 AM

## 2011-12-25 NOTE — Brief Op Note (Signed)
12/25/2011  5:48 PM  PATIENT:  Deanna Rodgers  46 y.o. female  PRE-OPERATIVE DIAGNOSIS:  Severe arthritis right knee  POST-OPERATIVE DIAGNOSIS:  Severe arthritis right knee  PROCEDURE:  Procedure(s) (LRB): COMPUTER ASSISTED TOTAL KNEE ARTHROPLASTY (Right)  SURGEON:  Surgeon(s) and Role:    * [name], MD - Primary  PHYSICIAN ASSISTANT:   ASSISTANTS: none   ANESTHESIA:   spinal  EBL:  Total I/O In: 2900 [I.V.:2400; IV Piggyback:500] Out: 950 [Urine:900; Blood:50]  BLOOD ADMINISTERED:none  DRAINS: (medium) Hemovact drain(s) in the knee with  Suction Open   LOCAL MEDICATIONS USED:  OTHER esmiril  SPECIMEN:  No Specimen  DISPOSITION OF SPECIMEN:  N/A  COUNTS:  YES  TOURNIQUET:   Total Tourniquet Time Documented: Thigh (Right) - 100 minutes  DICTATION: .Other Dictation: Dictation Number [id number]  PLAN OF CARE: Admit to inpatient   PATIENT DISPOSITION:  PACU - hemodynamically stable.   Delay start of Pharmacological VTE agent (>24hrs) due to surgical blood loss or risk of bleeding: yes

## 2011-12-25 NOTE — Anesthesia Procedure Notes (Addendum)
Spinal  Patient location during procedure: OR Start time: 12/25/2011 2:55 PM End time: 12/25/2011 3:01 PM Staffing CRNA/Resident: Donnice Nielsen E Performed by: resident/CRNA  Preanesthetic Checklist Completed: patient identified, site marked, surgical consent, pre-op evaluation, timeout performed, IV checked, risks and benefits discussed and monitors and equipment checked Spinal Block Patient position: sitting Prep: Betadine Patient monitoring: heart rate, cardiac monitor, continuous pulse ox and blood pressure Location: L3-4 Injection technique: single-shot Needle Needle type: Spinocan  Needle gauge: 22 G Needle length: 9 cm Assessment Sensory level: T10 Additional Notes Negative heme, good CSF flow,negative paresthesia

## 2011-12-25 NOTE — Anesthesia Postprocedure Evaluation (Signed)
  Anesthesia Post-op Note  Patient: Deanna Rodgers  Procedure(s) Performed: Procedure(s) (LRB): COMPUTER ASSISTED TOTAL KNEE ARTHROPLASTY (Right)  Patient Location: PACU  Anesthesia Type: Spinal  Level of Consciousness: awake and alert   Airway and Oxygen Therapy: Patient Spontanous Breathing  Post-op Pain: mild  Post-op Assessment: Post-op Vital signs reviewed, Patient's Cardiovascular Status Stable, Respiratory Function Stable, Patent Airway and No signs of Nausea or vomiting  Post-op Vital Signs: stable  Complications: No apparent anesthesia complications

## 2011-12-26 LAB — BASIC METABOLIC PANEL
BUN: 8 mg/dL (ref 6–23)
CO2: 25 mEq/L (ref 19–32)
Calcium: 8.3 mg/dL — ABNORMAL LOW (ref 8.4–10.5)
Chloride: 100 mEq/L (ref 96–112)
Creatinine, Ser: 0.52 mg/dL (ref 0.50–1.10)
GFR calc Af Amer: >90 mL/min (ref >90)
GFR calc non Af Amer: >90 mL/min (ref >90)
Glucose, Bld: 130 mg/dL — ABNORMAL HIGH (ref 70–99)
Potassium: 3.8 mEq/L (ref 3.5–5.1)
Sodium: 132 mEq/L — ABNORMAL LOW (ref 135–145)

## 2011-12-26 LAB — CBC
HCT: 29.5 % — ABNORMAL LOW (ref 36.0–46.0)
Hemoglobin: 9.2 g/dL — ABNORMAL LOW (ref 12.0–15.0)
MCH: 21.4 pg — ABNORMAL LOW (ref 26.0–34.0)
MCHC: 31.2 g/dL (ref 30.0–36.0)
MCV: 68.6 fL — ABNORMAL LOW (ref 78.0–100.0)
Platelets: 185 10*3/uL (ref 150–400)
RBC: 4.3 MIL/uL (ref 3.87–5.11)
RDW: 14.9 % (ref 11.5–15.5)
WBC: 9.6 10*3/uL (ref 4.0–10.5)

## 2011-12-26 NOTE — Progress Notes (Signed)
Subjective: Pt c/o right knee pain   Objective: Vital signs in last 24 hours: Temp:  [97.2 F (36.2 C)-98.5 F (36.9 C)] 98 F (36.7 C) (02/23 0628) Pulse Rate:  [79-105] 79  (02/23 0628) Resp:  [10-20] 16  (02/23 0628) BP: (119-171)/(79-110) 126/84 mmHg (02/23 0628) SpO2:  [98 %-100 %] 100 % (02/23 0628)  Intake/Output from previous day: 02/22 0701 - 02/23 0700 In: 3260 [P.O.:360; I.V.:2400; IV Piggyback:500] Out: 2820 [Urine:2225; Drains:545; Blood:50] Intake/Output this shift:    Exam:  Neurovascular intact Sensation intact distally Intact pulses distally  Labs:  Basename 12/26/11 0435 12/24/11 1505  HGB 9.2* 12.0    Basename 12/26/11 0435 12/24/11 1505  WBC 9.6 4.9  RBC 4.30 5.48*  HCT 29.5* 37.3  PLT 185 201    Basename 12/26/11 0435 12/24/11 1505  NA 132* 138  K 3.8 3.5  CL 100 104  CO2 25 25  BUN 8 11  CREATININE 0.52 0.49*  GLUCOSE 130* 85  CALCIUM 8.3* 9.2   No results found for this basename: LABPT:2,INR:2 in the last 72 hours  Assessment/Plan: Pt stable but having pain - will continue iv pain meds today - mobilize with PT   Faron Whitelock SCOTT 12/26/2011, 10:05 AM

## 2011-12-26 NOTE — Progress Notes (Signed)
Physical Therapy Treatment Patient Details Name: Deanna Rodgers MRN: [id number] DOB: 02/23/1966 Today's Date: 12/26/2011  PT Assessment/Plan  PT - Assessment/Plan Comments on Treatment Session: Pt able to tolerate increased distance of ambulation this afternoon but reporting increased knee pain so assisted back to supine and ice packs applied. PT Plan: Discharge plan remains appropriate;Frequency remains appropriate PT Frequency: 7X/week Recommendations for Other Services: OT consult Follow Up Recommendations: Home health PT Equipment Recommended: None recommended by PT PT Goals  Acute Rehab PT Goals PT Goal Formulation: With patient Time For Goal Achievement: 7 days Pt will go Supine/Side to Sit: with supervision PT Goal: Supine/Side to Sit - Progress: Goal set today Pt will go Sit to Supine/Side: with supervision PT Goal: Sit to Supine/Side - Progress: Progressing toward goal Pt will go Sit to Stand: with supervision PT Goal: Sit to Stand - Progress: Progressing toward goal Pt will go Stand to Sit: with supervision PT Goal: Stand to Sit - Progress: Progressing toward goal Pt will Ambulate: 51 - 150 feet;with supervision;with rolling walker PT Goal: Ambulate - Progress: Progressing toward goal Pt will Go Up / Down Stairs: Flight;with min assist;with least restrictive assistive device PT Goal: Up/Down Stairs - Progress: Goal set today  PT Treatment Precautions/Restrictions  Precautions Precautions: Knee Required Braces or Orthoses: Yes Knee Immobilizer: Discontinue once straight leg raise with < 10 degree lag Restrictions Weight Bearing Restrictions: Yes Other Position/Activity Restrictions: WBAT Mobility (including Balance) Bed Mobility Bed Mobility: Yes Supine to Sit: 4: Min assist;HOB flat Supine to Sit Details (indicate cue type and reason): min assist with R LE and cues to self assist with UEs Sit to Supine: 4: Min assist Sit to Supine - Details (indicate cue type  and reason): assist for R LE Transfers Transfers: Yes Sit to Stand: 4: Min assist;With upper extremity assist;From chair/3-in-1 Sit to Stand Details (indicate cue type and reason): min/guard, verbal cues for hand placement Stand to Sit: 4: Min assist;With upper extremity assist;To bed Stand to Sit Details: assist for supporting R LE 2* to pain, verbal cues for safe technique Ambulation/Gait Ambulation/Gait: Yes Ambulation/Gait Assistance: 4: Min assist Ambulation/Gait Assistance Details (indicate cue type and reason): min/guard, verbal cues for sequence and safe RW distance  Ambulation Distance (Feet): 80 Feet Assistive device: Rolling walker Gait Pattern: Step-to pattern Gait velocity: slow    Exercise   End of Session PT - End of Session Equipment Utilized During Treatment: Right knee immobilizer Activity Tolerance: Patient limited by pain Patient left: with call bell in reach;in bed;with family/visitor present Nurse Communication: Mobility status for transfers;Mobility status for ambulation General Behavior During Session: [facility] for tasks performed Cognition: [facility] for tasks performed  Tylerjames Hoglund,[name] 12/26/2011, 3:51 PM Pager: [phone]

## 2011-12-26 NOTE — Evaluation (Signed)
Physical Therapy Evaluation Patient Details Name: Deanna Rodgers MRN: [id number] DOB: 04-18-1966 Today's Date: 12/26/2011  Problem List:  Patient Active Problem List  Diagnoses  . IRON DEFICIENCY  . ACUT DUODEN ULCER W/PERF W/O MENTION OBSTRUCTION  . DYSPEPSIA&OTHER SPEC DISORDERS FUNCTION STOMACH  . RECTAL BLEEDING  . HEMORRHOIDS-INTERNAL  . HEMORRHOIDS-EXTERNAL  . GERD  . ULCER-DUODENAL  . IRRITABLE BOWEL SYNDROME  . Degenerative arthritis of knee    Past Medical History: History reviewed. No pertinent past medical history. Past Surgical History:  Past Surgical History  Procedure Date  . Cesarean section 12-24-11    '85- x1  . Knee arthroscopy 12-24-11    [facility] -[facility]  . Tubal ligation     PT Assessment/Plan/Recommendation PT Assessment Clinical Impression Statement: Pt wtih R TKR presents with decreased R LE strength/ROM and ltd functional mobility PT Recommendation/Assessment: Patient will need skilled PT in the acute care venue PT Problem List: Decreased strength;Decreased range of motion;Decreased activity tolerance;Decreased mobility;Decreased knowledge of use of DME;Pain PT Therapy Diagnosis : Difficulty walking PT Plan PT Frequency: 7X/week PT Treatment/Interventions: DME instruction;Gait training;Stair training;Functional mobility training;Therapeutic activities;Therapeutic exercise;Patient/family education PT Recommendation Recommendations for Other Services: OT consult Follow Up Recommendations: Home health PT Equipment Recommended: None recommended by PT PT Goals  Acute Rehab PT Goals PT Goal Formulation: With patient Time For Goal Achievement: 7 days Pt will go Supine/Side to Sit: with supervision PT Goal: Supine/Side to Sit - Progress: Goal set today Pt will go Sit to Supine/Side: with supervision PT Goal: Sit to Supine/Side - Progress: Goal set today Pt will go Sit to Stand: with supervision PT Goal: Sit to Stand - Progress: Goal  set today Pt will go Stand to Sit: with supervision PT Goal: Stand to Sit - Progress: Goal set today Pt will Ambulate: 51 - 150 feet;with supervision;with rolling walker PT Goal: Ambulate - Progress: Goal set today Pt will Go Up / Down Stairs: Flight;with min assist;with least restrictive assistive device PT Goal: Up/Down Stairs - Progress: Goal set today  PT Evaluation Precautions/Restrictions  Precautions Precautions: Knee Required Braces or Orthoses: Yes Knee Immobilizer: Discontinue once straight leg raise with < 10 degree lag Restrictions Other Position/Activity Restrictions: WBAT Prior Functioning  Home Living Lives With: Significant other Receives Help From: Family Type of Home: Apartment Home Layout: One level Home Access: Stairs to enter Entrance Stairs-Rails: Left Entrance Stairs-Number of Steps: 2 flights to second floor apt Home Adaptive Equipment: Walker - rolling Prior Function Level of Independence: Independent with basic ADLs;Independent with gait;Independent with transfers Able to Take Stairs?: Yes Cognition Cognition Arousal/Alertness: Awake/alert Overall Cognitive Status: Appears within functional limits for tasks assessed Orientation Level: Oriented X4 Sensation/Coordination Coordination Gross Motor Movements are Fluid and Coordinated: Yes Extremity Assessment RUE Assessment RUE Assessment: Within Functional Limits LUE Assessment LUE Assessment: Within Functional Limits RLE Assessment RLE Assessment: Exceptions to [facility] (-10 - -40 AAROM with 2/5 quads) LLE Assessment LLE Assessment: Within Functional Limits Mobility (including Balance) Bed Mobility Bed Mobility: Yes Supine to Sit: 4: Min assist;HOB flat Supine to Sit Details (indicate cue type and reason): min assist with R LE and cues to self assist with UEs Transfers Transfers: Yes Sit to Stand: 4: Min assist Sit to Stand Details (indicate cue type and reason): cues for use of UEs and LE  position Stand to Sit: 4: Min assist;To bed;With upper extremity assist Stand to Sit Details: cues for use of UEs and LE position Ambulation/Gait Ambulation/Gait: Yes Ambulation/Gait Assistance: 4: Min assist  Ambulation/Gait Assistance Details (indicate cue type and reason): cues for posture, sequence and position from RW Ambulation Distance (Feet): 56 Feet Assistive device: Rolling walker Gait Pattern: Step-to pattern    Exercise  Total Joint Exercises Ankle Circles/Pumps: AROM;10 reps;Supine;Both Quad Sets: AROM;Both;10 reps;Supine Heel Slides: AAROM;10 reps;Supine;Right Straight Leg Raises: AAROM;10 reps;Supine;Right End of Session PT - End of Session Activity Tolerance: Patient tolerated treatment well Patient left: in chair;with call bell in reach;with family/visitor present Nurse Communication: Mobility status for transfers;Mobility status for ambulation General Behavior During Session: [facility] for tasks performed Cognition: [facility] for tasks performed  Deanna Rodgers 12/26/2011, 3:39 PM

## 2011-12-26 NOTE — Op Note (Signed)
Deanna Rodgers, Deanna Rodgers NO.:  [id number]  MEDICAL RECORD NO.:  [id number]  LOCATION:  1615                         FACILITY:  [facility]  PHYSICIAN:  Vanita Panda. Magnus Ivan, M.D.DATE OF BIRTH:  1966/02/11  DATE OF PROCEDURE:  12/25/2011 DATE OF DISCHARGE:                              OPERATIVE REPORT   PREOPERATIVE DIAGNOSIS:  Right knee with recurrent effusion, synovitis and osteoarthritis with failure of conservative treatment.  POSTOPERATIVE DIAGNOSIS:  Right knee with recurrent effusion, synovitis and osteoarthritis with failure of conservative treatment.  PROCEDURE:  Right total knee arthroplasty utilizing computer navigation as assistance.  IMPLANTS:  Stryker triathlon knee with size 3 femur, size 3 tibial tray, 9-mm polyethylene insert, 29-mm patellar button.  SURGEON:  Vanita Panda. Magnus Ivan, M.D.  ANESTHESIA: 1. Spinal. 2. IV sedation.  ANTIBIOTICS:  1g IV Ancef.  TOURNIQUET TIME:  Less than 2 hours.  BLOOD LOSS:  Less than 200 cc.  COMPLICATIONS:  None.  INDICATIONS:  Deanna Rodgers is a 46 year old female, well-known to me.  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 We performed 2 arthroscopic interventions of her knee already, we have tried steroid injections, supplement injections, and she continues to have recurrent effusion and debilitating pain.  On the last arthroscopy, I found cartilage loss on both lateral and medial femoral condyles, lateral and medial tibial plateaus as well as the trochlear groove.  Due to the impact on her activities of daily living and pain in general, she wished to proceed with a total knee arthroplasty.  The risks and benefits of this were explained to her in detail including the risk of needing a revision in the future given her young age, fracture, acute blood loss anemia, nerve and vessel injury, DVT, and PE.  The benefits are decreased pain and improved function and quality  of life __________.  PROCEDURE DESCRIPTION:  After informed consent was obtained, appropriate right knee was marked.  She was brought to the operating room and then on a stretcher set up and spinal anesthesia was obtained.  She was then laid back down flat once traction placed on the operating table. Nonsterile tourniquet was placed on her upper right thigh and her right leg was prepped and draped with DuraPrep, sterile drapes including a sterile stockinette.  A time-out was called and she was identified as correct patient and correct right knee.  I then used an Esmarch and wrapped out the leg and tourniquet was inflated to 300 mmHg.  I then made a midline incision directly over the patella and carried this proximally and distally and dissected down to the knee joint.  I then performed a medial parapatellar arthrotomy to open the knee.  There was a large amount of effusion and fluid in her knee and there was definitely a brownish-type of tissue around the synovium.  I did perform a partial synovectomy as well.  I then cleaned the knee of debris including osteophytes, remnants of ACL, PCL, medial and lateral meniscus.  I then began to continue navigation portion of the case using Stryker navigation system.  Using navigation system based on a Stryker triathlon knee system,  we took 9-mm off the  tibia and then did our distal femoral cut with 8-mm.  We were able to use cutting guides to dial in our varus-valgus as well as slope flexion and extension.  Once I made my distal femoral cut and my tibial cut, I placed a trial 9-mm insert with the knee in extended position and this was stable.  Next, I turned attention to the femur.  I set my rotation for 30 degrees of external rotation and set my guide for a size 3 femur.  Once the rotation guide was set, I made my anterior and posterior cuts as well as my chamfer cuts.  I then trialed a size 3 femur followed by the size 3 tibial tray with a 9-mm  insert and this was felt to be stable as well with her flexion-extension gaps felt to be equal.  I then punched a hole for the keel for the size 3 tibia.  I then cut my patella and drilled lugs for size 29 patellar button.  With all trial implants in place, I put the knee through a range of motion, and I was pleased with the stability.  I then removed all trial instrumentation.  We copiously irrigated the knee with pulsatile lavage using normal saline solution. I then cemented the size 3 tibial tray followed by the size 3 femur.  I cemented the 29-mm patellar button and there was a size 29 patellar button and a 9-mm polyethylene insert.  Once the cement had dried with the knee in extension, we let the tourniquet down and  hemostasis was obtained with electrocautery.  I then placed a medium Hemovac drain in the arthrotomy and closed the arthrotomy with a running V-Loc suture.  I then closed the subcutaneous tissue with 2-0 Vicryl followed by interrupted staples on the skin.  Xeroform followed by padded sterile dressing was applied and the patient was taken to recovery room in stable condition.  All final counts were correct.  There were no complications noted.     Vanita Panda. Magnus Ivan, M.D.     CYB/MEDQ  D:  12/25/2011  T:  12/26/2011  Job:  [id number]

## 2011-12-26 NOTE — Progress Notes (Signed)
Pt self-rate pain #6 (1-10) with given analgesics. Spoke to Dr. August Saucer regarding uncontrolled pain. Instructed to offer options to pt:  to continue presence pain med or change to full dose dilaudid only.  Pt stated that she will continue the morphine and pain pill for breakthrough pain.

## 2011-12-27 LAB — CBC
HCT: 26.4 % — ABNORMAL LOW (ref 36.0–46.0)
Hemoglobin: 8.3 g/dL — ABNORMAL LOW (ref 12.0–15.0)
MCH: 21.6 pg — ABNORMAL LOW (ref 26.0–34.0)
MCHC: 31.4 g/dL (ref 30.0–36.0)
MCV: 68.6 fL — ABNORMAL LOW (ref 78.0–100.0)
Platelets: 170 10*3/uL (ref 150–400)
RBC: 3.85 MIL/uL — ABNORMAL LOW (ref 3.87–5.11)
RDW: 14.9 % (ref 11.5–15.5)
WBC: 9.3 10*3/uL (ref 4.0–10.5)

## 2011-12-27 MED ORDER — SODIUM CHLORIDE 0.9 % IV SOLN: INTRAVENOUS | Status: DC

## 2011-12-27 NOTE — Evaluation (Signed)
Occupational Therapy Evaluation Patient Details Name: Deanna Rodgers MRN: [id number] DOB: 06/02/1966 Today's Date: 12/27/2011  Problem List:  Patient Active Problem List  Diagnoses  . IRON DEFICIENCY  . ACUT DUODEN ULCER W/PERF W/O MENTION OBSTRUCTION  . DYSPEPSIA&OTHER SPEC DISORDERS FUNCTION STOMACH  . RECTAL BLEEDING  . HEMORRHOIDS-INTERNAL  . HEMORRHOIDS-EXTERNAL  . GERD  . ULCER-DUODENAL  . IRRITABLE BOWEL SYNDROME  . Degenerative arthritis of knee    Past Medical History: History reviewed. No pertinent past medical history. Past Surgical History:  Past Surgical History  Procedure Date  . Cesarean section 12-24-11    '85- x1  . Knee arthroscopy 12-24-11    [facility] -[facility]  . Tubal ligation     OT Assessment/Plan/Recommendation OT Assessment Clinical Impression Statement: [name].  Will follow acutely to work on ADL and identify tub equipment needs to get back to PLOF.  HHOT recommended to address IADL and finalized DME for tub if not done acutely. OT Recommendation/Assessment: Patient will need skilled OT in the acute care venue OT Problem List: Impaired balance (sitting and/or standing);Decreased activity tolerance;Decreased knowledge of use of DME or AE;Pain OT Therapy Diagnosis : Generalized weakness;Acute pain OT Plan OT Frequency: Min 2X/week OT Treatment/Interventions: Self-care/ADL training;DME and/or AE instruction;Patient/family education OT Recommendation Follow Up Recommendations: Home health OT;Supervision - Intermittent Equipment Recommended: None recommended by OT;Other (comment) (tub equipment to be determined) Individuals Consulted Consulted and Agree with Results and Recommendations: Patient OT Goals Acute Rehab OT Goals OT Goal Formulation: With patient Time For Goal Achievement: 7 days ADL Goals Pt Will Perform Grooming: Standing at sink;with  supervision ADL Goal: Grooming - Progress: Goal set today Pt Will Perform Lower Body Bathing: with supervision;with adaptive equipment;Sitting, edge of bed;Sit to stand from bed ADL Goal: Lower Body Bathing - Progress: Goal set today Pt Will Perform Lower Body Dressing: with adaptive equipment;Sitting, bed;Sit to stand from bed;with supervision ADL Goal: Lower Body Dressing - Progress: Goal set today Pt Will Transfer to Toilet: with supervision;Ambulation;3-in-1 (over toilet) ADL Goal: Toilet Transfer - Progress: Goal set today Pt Will Perform Toileting - Clothing Manipulation: with supervision;Standing ADL Goal: Toileting - Clothing Manipulation - Progress: Goal set today Pt Will Perform Toileting - Hygiene: Independently;Sitting on 3-in-1 or toilet ADL Goal: Toileting - Hygiene - Progress: Goal set today Pt Will Perform Tub/Shower Transfer: Tub transfer;with supervision;with DME;Ambulation ADL Goal: Tub/Shower Transfer - Progress: Goal set today  OT Evaluation Precautions/Restrictions  Precautions Precautions: Knee Required Braces or Orthoses: Yes Knee Immobilizer: Discontinue once straight leg raise with < 10 degree lag Restrictions Weight Bearing Restrictions: No Other Position/Activity Restrictions: WBAT Prior Functioning Home Living Lives With: Significant other Receives Help From: Family Type of Home: Apartment Home Layout: One level Home Access: Stairs to enter Entrance Stairs-Rails: Left Entrance Stairs-Number of Steps: 2 flights to second floor apt Bathroom Shower/Tub: Tub/shower unit;Curtain Bathroom Toilet: Standard (has a 3 in 1 at home) Home Adaptive Equipment: Bedside commode/3-in-1;Walker - rolling Prior Function Level of Independence: Independent with basic ADLs;Independent with gait;Independent with transfers Able to Take Stairs?: Yes ADL ADL Eating/Feeding: Simulated;Independent Where Assessed - Eating/Feeding: Bed level Grooming: Performed;Wash/dry  hands;Set up Where Assessed - Grooming: Sitting, bed Upper Body Bathing: Simulated;Set up Where Assessed - Upper Body Bathing: Sitting, bed Lower Body Bathing: Simulated;Maximal assistance Where Assessed - Lower Body Bathing: Sitting, bed;Sit to stand from bed Upper Body Dressing: Simulated;Set up Where Assessed - Upper Body Dressing:  Sitting, bed Lower Body Dressing: Performed;Maximal assistance Where Assessed - Lower Body Dressing: Sitting, bed;Sit to stand from bed Toilet Transfer: Performed;Minimal assistance Toilet Transfer Method: Stand pivot Toilet Transfer Equipment: Bedside commode Toileting - Hygiene: Performed;Maximal assistance Where Assessed - Toileting Hygiene: Standing ADL Comments: Pt appeared to have minimal interest in working on ADL or education about AE or DME.  Will offer education next visit. Vision/Perception  Vision - History Baseline Vision: No visual deficits Patient Visual Report: No change from baseline Cognition Cognition Arousal/Alertness: Awake/alert Overall Cognitive Status: Appears within functional limits for tasks assessed Orientation Level: Oriented X4 Sensation/Coordination Coordination Gross Motor Movements are Fluid and Coordinated: Yes Fine Motor Movements are Fluid and Coordinated: Yes Extremity Assessment RUE Assessment RUE Assessment: Within Functional Limits LUE Assessment LUE Assessment: Within Functional Limits Mobility  Bed Mobility Bed Mobility: Yes Supine to Sit: 4: Min assist;With rails;HOB elevated (Comment degrees) (45) Sit to Supine: 4: Min assist;With rail;HOB elevated (comment degrees) (45) Transfers Transfers: Yes Sit to Stand: From bed;With upper extremity assist;5: Supervision;From chair/3-in-1 Stand to Sit: With upper extremity assist;To bed;To chair/3-in-1;5: Supervision End of Session OT - End of Session Equipment Utilized During Treatment: Gait belt Activity Tolerance: Patient limited by pain Patient left: in  bed;with call bell in reach;with family/visitor present General Behavior During Session: Flat affect Cognition: [facility] for tasks performed   Evern Bio 12/27/2011, 10:39 AM

## 2011-12-27 NOTE — Progress Notes (Signed)
Cm spoke with pt concerning d/c planning. Choice list offered for [facility]. Pt unable to make choice at this time. Family member present in room reports willing to assist pt with home care upon d/c.   Roxy Manns Vedha Tercero,RN,BSN [phone]

## 2011-12-27 NOTE — Progress Notes (Signed)
Physical Therapy Treatment Patient Details Name: Deanna Rodgers MRN: [id number] DOB: 07-19-1966 Today's Date: 12/27/2011  PT Assessment/Plan  PT - Assessment/Plan Comments on Treatment Session: Progressing with distance with little c/o dizziness.  Needs to start stair negotiation practice in pm if able. PT Plan: Discharge plan remains appropriate PT Frequency: 7X/week Follow Up Recommendations: Home health PT PT Goals  Acute Rehab PT Goals Pt will go Supine/Side to Sit: with supervision PT Goal: Supine/Side to Sit - Progress: Progressing toward goal Pt will go Sit to Supine/Side: with supervision PT Goal: Sit to Supine/Side - Progress: Progressing toward goal Pt will go Sit to Stand: with supervision PT Goal: Sit to Stand - Progress: Progressing toward goal Pt will go Stand to Sit: with supervision PT Goal: Stand to Sit - Progress: Progressing toward goal Pt will Ambulate: 51 - 150 feet;with supervision;with rolling walker PT Goal: Ambulate - Progress: Met  PT Treatment Precautions/Restrictions  Precautions Precautions: Knee Required Braces or Orthoses: Yes Knee Immobilizer: On except when in CPM;Discontinue once straight leg raise with < 10 degree lag Restrictions Weight Bearing Restrictions: Yes RLE Weight Bearing: Weight bearing as tolerated Other Position/Activity Restrictions: WBAT Mobility (including Balance) Bed Mobility Bed Mobility: Yes Supine to Sit: 4: Min assist;With rails (overhead trapeze) Supine to Sit Details (indicate cue type and reason): for right LE; HOB 40* Sit to Supine: 4: Min assist Sit to Supine - Details (indicate cue type and reason): for right LE, cues for positioning in bed Transfers Sit to Stand: 4: Min assist;With upper extremity assist;From bed Sit to Stand Details (indicate cue type and reason): minguard assist with cues for technnique Stand to Sit: 4: Min assist;With upper extremity assist;To bed Stand to Sit Details: cues for  safety Ambulation/Gait Ambulation/Gait Assistance: 5: Supervision Ambulation/Gait Assistance Details (indicate cue type and reason): cues to increase left step length without appreciable change in pattern Ambulation Distance (Feet): 130 Feet Assistive device: Rolling walker Gait Pattern: Antalgic;Decreased stride length    Exercise  Total Joint Exercises Ankle Circles/Pumps: AROM;Both;10 reps;Supine Quad Sets: AROM;Right;10 reps;Supine Short Arc Quad: AAROM;Right;Supine;10 reps Heel Slides: AAROM;Right;10 reps;Supine Hip ABduction/ADduction: AAROM;Right;10 reps;Supine (self assist with sheet) Straight Leg Raises: AAROM;Right;10 reps;Supine (self assist with sheet) End of Session PT - End of Session Equipment Utilized During Treatment: Gait belt;Right knee immobilizer Activity Tolerance: Patient limited by fatigue Patient left: in bed;with call bell in reach;with family/visitor present General Behavior During Session: [facility] for tasks performed Cognition: [facility] for tasks performed  [facility] 12/27/2011, 1:48 PM

## 2011-12-27 NOTE — Progress Notes (Signed)
Physical Therapy Treatment Patient Details Name: Deanna Rodgers MRN: [id number] DOB: Nov 06, 1965 Today's Date: 12/27/2011  PT Assessment/Plan  PT - Assessment/Plan Comments on Treatment Session: Deferred exercise due to patient needing encouragement to walk and due to number of visitors in room.  Will need to start stair training in am. PT Plan: Discharge plan remains appropriate PT Frequency: 7X/week Follow Up Recommendations: Home health PT PT Goals  Acute Rehab PT Goals Pt will go Sit to Stand: with supervision PT Goal: Sit to Stand - Progress: Progressing toward goal Pt will go Stand to Sit: with supervision PT Goal: Stand to Sit - Progress: Progressing toward goal Pt will Ambulate: 51 - 150 feet;with supervision;with rolling walker PT Goal: Ambulate - Progress: Progressing toward goal  PT Treatment Precautions/Restrictions  Precautions Precautions: Knee Required Braces or Orthoses: Yes Knee Immobilizer: Discontinue once straight leg raise with < 10 degree lag Restrictions Weight Bearing Restrictions: Yes RLE Weight Bearing: Weight bearing as tolerated Other Position/Activity Restrictions: WBAT Mobility (including Balance) Transfers Sit to Stand: 4: Min assist;With upper extremity assist;From chair/3-in-1;With armrests Sit to Stand Details (indicate cue type and reason): assist only for right LE Stand to Sit: 4: Min assist;With upper extremity assist;To chair/3-in-1;With armrests Stand to Sit Details: assist for right LE Ambulation/Gait Ambulation/Gait Assistance: 5: Supervision;4: Min assist Ambulation/Gait Assistance Details (indicate cue type and reason): supervision with minguard when trying to do step through sequence Ambulation Distance (Feet): 140 Feet Assistive device: Rolling walker Gait Pattern: Step-to pattern;Step-through pattern  Balance Balance Assessed: Yes Static Standing Balance Static Standing - Balance Support: No upper extremity supported;Left  upper extremity supported Static Standing - Level of Assistance: 5: Stand by assistance Static Standing - Comment/# of Minutes: while performing pericare and rinsing washcloth in sink Exercise   End of Session PT - End of Session Equipment Utilized During Treatment: Gait belt;Right knee immobilizer Activity Tolerance: Patient limited by fatigue (needed encouragement to participate) Patient left: in chair;with call bell in reach;with family/visitor present General Behavior During Session: [facility] for tasks performed Cognition: [facility] for tasks performed  [facility] 12/27/2011, 5:24 PM

## 2011-12-27 NOTE — Progress Notes (Signed)
Subjective: 2 Days Post-Op Procedure(s) (LRB): COMPUTER ASSISTED TOTAL KNEE ARTHROPLASTY (Right) Patient reports pain as moderate.  Working with therapy.  Objective: Vital signs in last 24 hours: Temp:  [98.4 F (36.9 C)-101.1 F (38.4 C)] 99.4 F (37.4 C) (02/24 0641) Pulse Rate:  [71-108] 71  (02/24 0611) Resp:  [16-18] 16  (02/24 0611) BP: (121-136)/(78-88) 123/88 mmHg (02/24 0611) SpO2:  [96 %-100 %] 96 % (02/24 0816)  Intake/Output from previous day: 02/23 0701 - 02/24 0700 In: 2899.5 [P.O.:720; I.V.:2179.5] Out: 1250 [Urine:1250] Intake/Output this shift:     Basename 12/27/11 0433 12/26/11 0435 12/24/11 1505  HGB 8.3* 9.2* 12.0    Basename 12/27/11 0433 12/26/11 0435  WBC 9.3 9.6  RBC 3.85* 4.30  HCT 26.4* 29.5*  PLT 170 185    Basename 12/26/11 0435 12/24/11 1505  NA 132* 138  K 3.8 3.5  CL 100 104  CO2 25 25  BUN 8 11  CREATININE 0.52 0.49*  GLUCOSE 130* 85  CALCIUM 8.3* 9.2   No results found for this basename: LABPT:2,INR:2 in the last 72 hours  Neurovascular intact Sensation intact distally Intact pulses distally Dorsiflexion/Plantar flexion intact Incision: scant drainage Compartment soft  Assessment/Plan: 2 Days Post-Op Procedure(s) (LRB): COMPUTER ASSISTED TOTAL KNEE ARTHROPLASTY (Right) Up with therapy  Yazmina Pareja Y 12/27/2011, 8:32 AM

## 2011-12-28 LAB — CBC
HCT: 25.9 % — ABNORMAL LOW (ref 36.0–46.0)
Hemoglobin: 8.1 g/dL — ABNORMAL LOW (ref 12.0–15.0)
MCH: 21.3 pg — ABNORMAL LOW (ref 26.0–34.0)
MCHC: 31.3 g/dL (ref 30.0–36.0)
MCV: 68.2 fL — ABNORMAL LOW (ref 78.0–100.0)
Platelets: 153 10*3/uL (ref 150–400)
RBC: 3.8 MIL/uL — ABNORMAL LOW (ref 3.87–5.11)
RDW: 14.9 % (ref 11.5–15.5)
WBC: 7 10*3/uL (ref 4.0–10.5)

## 2011-12-28 NOTE — Progress Notes (Signed)
Physical Therapy Treatment Patient Details Name: Deanna Rodgers MRN: [id number] DOB: 1966-10-24 Today's Date: 12/28/2011  R TKR POD #3 am session 11:55 - 12:15 1 gt  1 ta  PT Assessment/Plan  PT - Assessment/Plan Comments on Treatment Session: Assisted pt OOB to bathroom then amb in hallway.  Unable to attempt stair training 2nd MAX c/o frontal lobe headache.  Reported to nurse.  Pt plans to D/C to home tomorrow. PT Plan: Discharge plan remains appropriate Follow Up Recommendations: Home health PT PT Goals  Acute Rehab PT Goals PT Goal Formulation: With patient Pt will go Supine/Side to Sit: with supervision PT Goal: Supine/Side to Sit - Progress: Progressing toward goal Pt will go Sit to Supine/Side: with supervision PT Goal: Sit to Supine/Side - Progress: Progressing toward goal Pt will go Sit to Stand: with supervision PT Goal: Sit to Stand - Progress: Progressing toward goal Pt will go Stand to Sit: with supervision PT Goal: Stand to Sit - Progress: Progressing toward goal Pt will Ambulate: 51 - 150 feet;with supervision;with rolling walker PT Goal: Ambulate - Progress: Progressing toward goal Pt will Go Up / Down Stairs: Flight;with min assist;with least restrictive assistive device PT Goal: Up/Down Stairs - Progress: Not met  PT Treatment Precautions/Restrictions  Precautions Precautions: Knee Precaution Comments: Pt instructed on KI use for amb and proper application Required Braces or Orthoses: Yes Knee Immobilizer: Discontinue once straight leg raise with < 10 degree lag Restrictions Weight Bearing Restrictions: No RLE Weight Bearing: Weight bearing as tolerated Other Position/Activity Restrictions: Pt instructed on WBAT Mobility (including Balance) Bed Mobility Bed Mobility: Yes Supine to Sit: 4: Min assist Supine to Sit Details (indicate cue type and reason): increased time and min assist to support r LE off bed Transfers Transfers: Yes Sit to Stand: From  bed;From toilet (MinGuard Assist) Sit to Stand Details (indicate cue type and reason): 25% VC's on hand placement and increased time Stand to Sit:  (MinGuard Assist) Stand to Sit Details: 25% VC's to advance R LE prior to sit Ambulation/Gait Ambulation/Gait: Yes Ambulation/Gait Assistance: Other (comment) (MinGuard Assist) Ambulation/Gait Assistance Details (indicate cue type and reason): increased time and MAX c/o frontal headache, reported to nurse Ambulation Distance (Feet): 145 Feet Assistive device: Rolling walker Gait Pattern: Step-to pattern;Decreased step length - right;Trunk flexed Gait velocity: Pt c/o 5/10 Knee pain with act Stairs: No (unable to attempt 2nd MAX c/o headache) Wheelchair Mobility Wheelchair Mobility: No    Exercise    End of Session PT - End of Session Equipment Utilized During Treatment: Gait belt;Right knee immobilizer Activity Tolerance: Patient limited by pain;Other (comment) (MAX c/o headache) Patient left: in chair;with call bell in reach Nurse Communication: Other (comment) (MAX c/o headache) General Behavior During Session: [facility] for tasks performed Cognition: [facility] for tasks performed  Felecia Shelling  PTA [facility]  Acute  Rehab Pager     [phone]

## 2011-12-28 NOTE — Progress Notes (Signed)
Subjective: 3 Days Post-Op Procedure(s) (LRB): COMPUTER ASSISTED TOTAL KNEE ARTHROPLASTY (Right) Patient reports pain as mild.  Acute blood loss anemia but no symptoms. Objective: Vital signs in last 24 hours: Temp:  [98.9 F (37.2 C)-99 F (37.2 C)] 98.9 F (37.2 C) (02/25 0500) Pulse Rate:  [99-110] 109  (02/25 0500) Resp:  [16-20] 20  (02/25 0500) BP: (119-147)/(85-93) 124/86 mmHg (02/25 0500) SpO2:  [96 %-100 %] 99 % (02/25 0500) Weight:  [72.576 kg (160 lb)] 72.576 kg (160 lb) (02/25 0339)  Intake/Output from previous day: 02/24 0701 - 02/25 0700 In: 960 [P.O.:960] Out: 1500 [Urine:1500] Intake/Output this shift: Total I/O In: -  Out: 700 [Urine:700]   Basename 12/28/11 0356 12/27/11 0433 12/26/11 0435  HGB 8.1* 8.3* 9.2*    Basename 12/28/11 0356 12/27/11 0433  WBC 7.0 9.3  RBC 3.80* 3.85*  HCT 25.9* 26.4*  PLT 153 170    Basename 12/26/11 0435  NA 132*  K 3.8  CL 100  CO2 25  BUN 8  CREATININE 0.52  GLUCOSE 130*  CALCIUM 8.3*   No results found for this basename: LABPT:2,INR:2 in the last 72 hours  Sensation intact distally Intact pulses distally Dorsiflexion/Plantar flexion intact Incision: no drainage Compartment soft  Assessment/Plan: 3 Days Post-Op Procedure(s) (LRB): COMPUTER ASSISTED TOTAL KNEE ARTHROPLASTY (Right) Plan for discharge tomorrow  [name] 12/28/2011, 6:44 AM

## 2011-12-28 NOTE — Progress Notes (Signed)
PM  PT Cancellation Note  ___Treatment cancelled today due to medical issues with patient which prohibited therapy  ___ Treatment cancelled today due to patient receiving procedure or test   _X_ Treatment cancelled today due to patient's refusal to participate (pt c/o MAX frontal lobe headache)  ___ Treatment cancelled today due to  Felecia Shelling  PTA [facility]  Acute  Rehab Pager     [phone]

## 2011-12-28 NOTE — Progress Notes (Signed)
..  OT Cancellation Note  Treatment cancelled today due to patient's refusal to participate:   Pt. Refused skilled O.T. Today stating she has a headache and did not want to get out of bed or participate in any of the tx. Options provided to her.  Will try back if time permits.   Thank you,  Tommy Medal, COTA/L

## 2011-12-29 MED ORDER — METHOCARBAMOL 500 MG PO TABS: 500.0000 mg | ORAL_TABLET | Freq: Four times a day (QID) | ORAL | Status: AC | PRN

## 2011-12-29 MED ORDER — OXYCODONE-ACETAMINOPHEN 5-325 MG PO TABS: 1.0000 | ORAL_TABLET | ORAL | Status: AC | PRN

## 2011-12-29 MED ORDER — RIVAROXABAN 10 MG PO TABS: 10.0000 mg | ORAL_TABLET | Freq: Every day | ORAL | Status: DC

## 2011-12-29 NOTE — Progress Notes (Signed)
CARE MANAGEMENT NOTE 12/29/2011  Patient:  Deanna Rodgers, Deanna Rodgers   Account Number:  [id number]  Date Initiated:  12/29/2011  Documentation initiated by:  DAVIS,TYMEEKA  Subjective/Objective Assessment:   46 YO FEMALE ADMITTED S/P right total knee replacement.     Action/Plan:   hOME WHEN STABLE   Anticipated DC Date:  12/28/2011   Anticipated DC Plan:  HOME W HOME HEALTH SERVICES  In-house referral  NA      DC Planning Services  CM consult      [facility] Choice  HOME HEALTH   Choice offered to / List presented to:  C-1 Patient   DME arranged  NA      DME agency  NA     HH arranged  HH-2 PT      [facility] agency  [facility]   Status of service:  Completed, signed off Medicare Important Message given?  NO (If response is "NO", the following Medicare IM given date fields will be blank) Date Medicare IM given:   Date Additional Medicare IM given:    Discharge Disposition:  HOME W HOME HEALTH SERVICES  Per UR Regulation:    Comments:  12/29/2011 Deanna Rodgers BSN CCM [phone] Pt discharged to home with [facility] services in place.  12/28/2011 Deanna Rodgers BSN CCM [phone] Pt states she wants agency that MD office referred her to. MD office referred patient to [facility]. Pt states her 2 daughters  will be her caregivers. She already has RW and 3N1. Deanna Rodgers will start services day after discharge

## 2011-12-29 NOTE — Discharge Summary (Signed)
Patient ID: Deanna Rodgers MRN: [id number] DOB/AGE: Aug 13, 1966 46 y.o.  Admit date: 12/25/2011 Discharge date: 12/29/2011  Admission Diagnoses:  Principal Problem:  *Degenerative arthritis of knee   Discharge Diagnoses:  Same  History reviewed. No pertinent past medical history.  Surgeries: Procedure(s): COMPUTER ASSISTED TOTAL KNEE ARTHROPLASTY on 12/25/2011   Consultants:    Discharged Condition: Improved  Hospital Course: Deanna Rodgers is an 46 y.o. female who was admitted 12/25/2011 for operative treatment ofDegenerative arthritis of knee. Patient has severe unremitting pain that affects sleep, daily activities, and work/hobbies. After pre-op clearance the patient was taken to the operating room on 12/25/2011 and underwent  Procedure(s): COMPUTER ASSISTED TOTAL KNEE ARTHROPLASTY.    Patient was given perioperative antibiotics: Anti-infectives     Start     Dose/Rate Route Frequency Ordered Stop   12/25/11 2100   ceFAZolin (ANCEF) IVPB 1 g/50 mL premix        1 g 100 mL/hr over 30 Minutes Intravenous Every 6 hours 12/25/11 1801 12/26/11 0910   12/25/11 1030   ceFAZolin (ANCEF) IVPB 1 g/50 mL premix        1 g 100 mL/hr over 30 Minutes Intravenous 60 min pre-op 12/25/11 1027 12/25/11 1445           Patient was given sequential compression devices, early ambulation, and chemoprophylaxis to prevent DVT.  Patient benefited maximally from hospital stay and there were no complications.    Recent vital signs: Patient Vitals for the past 24 hrs:  BP Temp Temp src Pulse Resp SpO2  12/29/11 0500 110/75 mmHg 97.9 F (36.6 C) Oral 86  20  100 %  12/28/11 2205 146/78 mmHg 99.2 F (37.3 C) Oral 96  20  97 %  12/28/11 1344 130/90 mmHg 99.4 F (37.4 C) Oral 94  20  100 %     Recent laboratory studies:  Basename 12/28/11 0356 01-21-12 0433  WBC 7.0 9.3  HGB 8.1* 8.3*  HCT 25.9* 26.4*  PLT 153 170  NA -- --  K -- --  CL -- --  CO2 -- --  BUN -- --  CREATININE --  --  GLUCOSE -- --  INR -- --  CALCIUM -- --     Discharge Medications:   Medication List  As of 12/29/2011  7:09 AM   STOP taking these medications         diclofenac sodium 1 % Gel      HYDROcodone-acetaminophen 7.5-325 MG per tablet      ibuprofen 800 MG tablet         TAKE these medications         methocarbamol 500 MG tablet   Commonly known as: ROBAXIN   Take 1 tablet (500 mg total) by mouth every 6 (six) hours as needed.      omeprazole 20 MG capsule   Commonly known as: PRILOSEC   Take 20 mg by mouth daily.      oxyCODONE-acetaminophen 5-325 MG per tablet   Commonly known as: PERCOCET   Take 1-2 tablets by mouth every 4 (four) hours as needed for pain.      rivaroxaban 10 MG Tabs tablet   Commonly known as: XARELTO   Take 1 tablet (10 mg total) by mouth daily with breakfast.      traMADol 50 MG tablet   Commonly known as: ULTRAM   Take 50 mg by mouth every 6 (six) hours as needed. Pain  Diagnostic Studies: X-ray Knee Right Port  12/25/2011  *RADIOLOGY REPORT*  Clinical Data: Postop right total knee arthroplasty.  PORTABLE RIGHT KNEE - 1-2 VIEW 12/25/2011 1810 hours:  Comparison: Right knee MRI 06/01/2011 [facility].  Right knee x-rays 05/15/2011 [facility] and 02/18/2010 [facility].  Findings: Anatomic alignment post right total knee arthroplasty. No acute complicating features.  Surgical drain in place.  IMPRESSION: Anatomic alignment post right total knee arthroplasty without acute complicating features.  Original Report Authenticated By: Arnell Sieving, M.D.    Disposition: 01-Home or Self Care       Signed: [name] 12/29/2011, 7:09 AM

## 2011-12-29 NOTE — Progress Notes (Signed)
Physical Therapy Treatment Patient Details Name: Deanna Rodgers MRN: [id number] DOB: [birth year] Today's Date: 12/29/2011  R TKR POD #4 10:45 - 11:15 1 gt  1 te  PT Assessment/Plan  PT - Assessment/Plan Comments on Treatment Session: Pt demon low activity motivation. Max encouragement to get OOB and practice steps as pt has a flight to get into her apartment.  Pt states, "I don't need to do steps", "they are going to carry me".  Advised pt she is capable with one rail, one crutch and KI.  Pt only agreed to practice 2 steps. PT Plan: Discharge plan remains appropriate Follow Up Recommendations: Home health PT PT Goals  Acute Rehab PT Goals PT Goal Formulation: With patient Pt will go Supine/Side to Sit: with supervision PT Goal: Supine/Side to Sit - Progress: Partly met Pt will go Sit to Supine/Side: with supervision PT Goal: Sit to Supine/Side - Progress: Partly met Pt will go Sit to Stand: with supervision PT Goal: Sit to Stand - Progress: Met Pt will go Stand to Sit: with supervision PT Goal: Stand to Sit - Progress: Met Pt will Ambulate: 51 - 150 feet;with supervision;with rolling walker PT Goal: Ambulate - Progress: Partly met Pt will Go Up / Down Stairs: Flight;with min assist;with least restrictive assistive device PT Goal: Up/Down Stairs - Progress: Partly met  PT Treatment Precautions/Restrictions  Precautions Precautions: Knee Precaution Comments: Pt instructed on KI use for amb and proper application Required Braces or Orthoses: Yes Knee Immobilizer: Discontinue once straight leg raise with < 10 degree lag Restrictions Weight Bearing Restrictions: No RLE Weight Bearing: Weight bearing as tolerated Other Position/Activity Restrictions: Pt instructed on WBAT Mobility (including Balance) Bed Mobility Bed Mobility: Yes Supine to Sit: 5: Supervision Supine to Sit Details (indicate cue type and reason): increased time and Min Assist to support R  LE Transfers Transfers: Yes Sit to Stand: 5: Supervision;From bed Sit to Stand Details (indicate cue type and reason): increased time and 25% VC's on proper tech and safety with hand placement Stand to Sit: 5: Supervision;To bed Stand to Sit Details: increased time and 25% VC's to extend R LE prior to sit Ambulation/Gait Ambulation/Gait: Yes Ambulation/Gait Assistance: 5: Supervision Ambulation/Gait Assistance Details (indicate cue type and reason): Pt agreed to amb only to and from steps, limited distance.  Amb with KI and instructed on use, proper application and when to D/C Ambulation Distance (Feet): 15 Feet Assistive device: Rolling walker Gait Pattern: Step-to pattern;Decreased stance time - right;Trunk flexed Gait velocity: Pt c/o 8/10 knee pain, RN notified and ICE applied Stairs: Yes Stairs Assistance: 5: Supervision Stairs Assistance Details (indicate cue type and reason): 50% VC's on proper sequencing, safety and importance of wearing KI.   Stair Management Technique: One rail Right;With crutches Number of Stairs: 2  Wheelchair Mobility Wheelchair Mobility: No    Exercise    End of Session PT - End of Session Equipment Utilized During Treatment: Gait belt;Right knee immobilizer Activity Tolerance: Patient limited by fatigue;Patient limited by pain Patient left: in bed;with call bell in reach;with family/visitor present Nurse Communication: Other (comment) (Pt ready for D/C to home) General Behavior During Session: [facility] for tasks performed Cognition: [facility] for tasks performed  Felecia Shelling  PTA [facility]  Acute  Rehab Pager     [phone]

## 2011-12-29 NOTE — Discharge Instructions (Signed)
Increase your activities as comfort allows. You can get your actual incision wet in the shower starting 2/27. Dry dressing daily.

## 2012-01-04 ENCOUNTER — Encounter (HOSPITAL_COMMUNITY): Payer: Self-pay | Admitting: Orthopaedic Surgery

## 2012-01-25 ENCOUNTER — Ambulatory Visit: Payer: Medicaid Other | Attending: Orthopaedic Surgery | Admitting: Physical Therapy

## 2012-01-25 DIAGNOSIS — M25569 Pain in unspecified knee: Secondary | ICD-10-CM | POA: Insufficient documentation

## 2012-01-25 DIAGNOSIS — M25669 Stiffness of unspecified knee, not elsewhere classified: Secondary | ICD-10-CM | POA: Insufficient documentation

## 2012-01-25 DIAGNOSIS — R262 Difficulty in walking, not elsewhere classified: Secondary | ICD-10-CM | POA: Insufficient documentation

## 2012-01-25 DIAGNOSIS — IMO0001 Reserved for inherently not codable concepts without codable children: Secondary | ICD-10-CM | POA: Insufficient documentation

## 2012-02-02 ENCOUNTER — Ambulatory Visit: Payer: Medicaid Other | Attending: Orthopaedic Surgery | Admitting: Physical Therapy

## 2012-02-02 DIAGNOSIS — M25569 Pain in unspecified knee: Secondary | ICD-10-CM | POA: Insufficient documentation

## 2012-02-02 DIAGNOSIS — R262 Difficulty in walking, not elsewhere classified: Secondary | ICD-10-CM | POA: Insufficient documentation

## 2012-02-02 DIAGNOSIS — IMO0001 Reserved for inherently not codable concepts without codable children: Secondary | ICD-10-CM | POA: Insufficient documentation

## 2012-02-02 DIAGNOSIS — M25669 Stiffness of unspecified knee, not elsewhere classified: Secondary | ICD-10-CM | POA: Insufficient documentation

## 2012-02-04 ENCOUNTER — Encounter: Payer: Medicaid Other | Admitting: Physical Therapy

## 2012-02-08 ENCOUNTER — Ambulatory Visit: Payer: Medicaid Other | Admitting: Physical Therapy

## 2012-02-10 ENCOUNTER — Ambulatory Visit: Payer: Medicaid Other | Admitting: Physical Therapy

## 2012-10-15 ENCOUNTER — Observation Stay (HOSPITAL_COMMUNITY)
Admission: EM | Admit: 2012-10-15 | Discharge: 2012-10-17 | Disposition: A | Discharge status: Home / Self Care | Payer: Medicaid Other | Attending: Internal Medicine | Admitting: Internal Medicine

## 2012-10-15 ENCOUNTER — Encounter (HOSPITAL_COMMUNITY): Payer: Self-pay | Admitting: Emergency Medicine

## 2012-10-15 DIAGNOSIS — T394X2A Poisoning by antirheumatics, not elsewhere classified, intentional self-harm, initial encounter: Secondary | ICD-10-CM | POA: Insufficient documentation

## 2012-10-15 DIAGNOSIS — T50992A Poisoning by other drugs, medicaments and biological substances, intentional self-harm, initial encounter: Secondary | ICD-10-CM | POA: Insufficient documentation

## 2012-10-15 DIAGNOSIS — K648 Other hemorrhoids: Secondary | ICD-10-CM

## 2012-10-15 DIAGNOSIS — E876 Hypokalemia: Secondary | ICD-10-CM | POA: Insufficient documentation

## 2012-10-15 DIAGNOSIS — T398X1A Poisoning by other nonopioid analgesics and antipyretics, not elsewhere classified, accidental (unintentional), initial encounter: Secondary | ICD-10-CM | POA: Insufficient documentation

## 2012-10-15 DIAGNOSIS — K625 Hemorrhage of anus and rectum: Secondary | ICD-10-CM

## 2012-10-15 DIAGNOSIS — K219 Gastro-esophageal reflux disease without esophagitis: Secondary | ICD-10-CM | POA: Diagnosis present

## 2012-10-15 DIAGNOSIS — M179 Osteoarthritis of knee, unspecified: Secondary | ICD-10-CM | POA: Diagnosis present

## 2012-10-15 DIAGNOSIS — I10 Essential (primary) hypertension: Secondary | ICD-10-CM | POA: Diagnosis present

## 2012-10-15 DIAGNOSIS — T426X1A Poisoning by other antiepileptic and sedative-hypnotic drugs, accidental (unintentional), initial encounter: Secondary | ICD-10-CM | POA: Insufficient documentation

## 2012-10-15 DIAGNOSIS — R1013 Epigastric pain: Secondary | ICD-10-CM | POA: Diagnosis present

## 2012-10-15 DIAGNOSIS — T4271XA Poisoning by unspecified antiepileptic and sedative-hypnotic drugs, accidental (unintentional), initial encounter: Secondary | ICD-10-CM | POA: Insufficient documentation

## 2012-10-15 DIAGNOSIS — K644 Residual hemorrhoidal skin tags: Secondary | ICD-10-CM

## 2012-10-15 DIAGNOSIS — M1710 Unilateral primary osteoarthritis, unspecified knee: Secondary | ICD-10-CM

## 2012-10-15 DIAGNOSIS — K589 Irritable bowel syndrome without diarrhea: Secondary | ICD-10-CM | POA: Diagnosis present

## 2012-10-15 DIAGNOSIS — K3189 Other diseases of stomach and duodenum: Secondary | ICD-10-CM | POA: Diagnosis present

## 2012-10-15 DIAGNOSIS — Z79899 Other long term (current) drug therapy: Secondary | ICD-10-CM | POA: Insufficient documentation

## 2012-10-15 DIAGNOSIS — T428X1A Poisoning by antiparkinsonism drugs and other central muscle-tone depressants, accidental (unintentional), initial encounter: Secondary | ICD-10-CM | POA: Insufficient documentation

## 2012-10-15 DIAGNOSIS — K269 Duodenal ulcer, unspecified as acute or chronic, without hemorrhage or perforation: Secondary | ICD-10-CM

## 2012-10-15 DIAGNOSIS — F411 Generalized anxiety disorder: Secondary | ICD-10-CM | POA: Insufficient documentation

## 2012-10-15 DIAGNOSIS — T398X2A Poisoning by other nonopioid analgesics and antipyretics, not elsewhere classified, intentional self-harm, initial encounter: Secondary | ICD-10-CM | POA: Insufficient documentation

## 2012-10-15 DIAGNOSIS — R5381 Other malaise: Secondary | ICD-10-CM | POA: Insufficient documentation

## 2012-10-15 DIAGNOSIS — T46904A Poisoning by unspecified agents primarily affecting the cardiovascular system, undetermined, initial encounter: Secondary | ICD-10-CM | POA: Insufficient documentation

## 2012-10-15 DIAGNOSIS — T50901A Poisoning by unspecified drugs, medicaments and biological substances, accidental (unintentional), initial encounter: Secondary | ICD-10-CM | POA: Diagnosis present

## 2012-10-15 DIAGNOSIS — D509 Iron deficiency anemia, unspecified: Secondary | ICD-10-CM

## 2012-10-15 DIAGNOSIS — G4700 Insomnia, unspecified: Secondary | ICD-10-CM | POA: Insufficient documentation

## 2012-10-15 DIAGNOSIS — T426X2A Poisoning by other antiepileptic and sedative-hypnotic drugs, intentional self-harm, initial encounter: Secondary | ICD-10-CM | POA: Insufficient documentation

## 2012-10-15 DIAGNOSIS — T481X4A Poisoning by skeletal muscle relaxants [neuromuscular blocking agents], undetermined, initial encounter: Principal | ICD-10-CM | POA: Insufficient documentation

## 2012-10-15 DIAGNOSIS — R000 Tachycardia, unspecified: Secondary | ICD-10-CM | POA: Insufficient documentation

## 2012-10-15 DIAGNOSIS — T4272XA Poisoning by unspecified antiepileptic and sedative-hypnotic drugs, intentional self-harm, initial encounter: Secondary | ICD-10-CM | POA: Insufficient documentation

## 2012-10-15 DIAGNOSIS — K261 Acute duodenal ulcer with perforation: Secondary | ICD-10-CM

## 2012-10-15 HISTORY — DX: Essential (primary) hypertension: I10

## 2012-10-15 LAB — RAPID URINE DRUG SCREEN, HOSP PERFORMED
Amphetamines: NOT DETECTED
Barbiturates: NOT DETECTED
Benzodiazepines: NOT DETECTED
Cocaine: NOT DETECTED
Opiates: NOT DETECTED
Tetrahydrocannabinol: NOT DETECTED

## 2012-10-15 LAB — CBC
HCT: 45.2 % (ref 36.0–46.0)
Hemoglobin: 14.5 g/dL (ref 12.0–15.0)
MCH: 21.4 pg — ABNORMAL LOW (ref 26.0–34.0)
MCHC: 32.1 g/dL (ref 30.0–36.0)
MCV: 66.6 fL — ABNORMAL LOW (ref 78.0–100.0)
Platelets: 243 10*3/uL (ref 150–400)
RBC: 6.79 MIL/uL — ABNORMAL HIGH (ref 3.87–5.11)
RDW: 15.2 % (ref 11.5–15.5)
WBC: 6.8 10*3/uL (ref 4.0–10.5)

## 2012-10-15 LAB — COMPREHENSIVE METABOLIC PANEL
ALT: 12 U/L (ref 0–35)
AST: 12 U/L (ref 0–37)
Albumin: 4.3 g/dL (ref 3.5–5.2)
Alkaline Phosphatase: 85 U/L (ref 39–117)
BUN: 15 mg/dL (ref 6–23)
CO2: 26 mEq/L (ref 19–32)
Calcium: 9.9 mg/dL (ref 8.4–10.5)
Chloride: 95 mEq/L — ABNORMAL LOW (ref 96–112)
Creatinine, Ser: 0.62 mg/dL (ref 0.50–1.10)
GFR calc Af Amer: >90 mL/min (ref >90)
GFR calc non Af Amer: >90 mL/min (ref >90)
Glucose, Bld: 93 mg/dL (ref 70–99)
Potassium: 3.1 mEq/L — ABNORMAL LOW (ref 3.5–5.1)
Sodium: 135 mEq/L (ref 135–145)
Total Bilirubin: 0.4 mg/dL (ref 0.3–1.2)
Total Protein: 8.5 g/dL — ABNORMAL HIGH (ref 6.0–8.3)

## 2012-10-15 LAB — ETHANOL: Alcohol, Ethyl (B): <11 mg/dL (ref 0–11)

## 2012-10-15 LAB — ACETAMINOPHEN LEVEL: Acetaminophen (Tylenol), Serum: <15 ug/mL (ref 10–30)

## 2012-10-15 LAB — SALICYLATE LEVEL: Salicylate Lvl: <2 mg/dL — ABNORMAL LOW (ref 2.8–20.0)

## 2012-10-15 LAB — PREGNANCY, URINE: Preg Test, Ur: NEGATIVE

## 2012-10-15 MED ORDER — SODIUM CHLORIDE 0.9 % IJ SOLN: 3.0000 mL | Freq: Two times a day (BID) | INTRAMUSCULAR | Status: DC

## 2012-10-15 MED ORDER — ACETAMINOPHEN 325 MG PO TABS
650.0000 mg | ORAL_TABLET | Freq: Once | ORAL | Status: AC
  Administered 2012-10-15: 650 mg via ORAL
  Filled 2012-10-15: qty 2

## 2012-10-15 MED ORDER — SODIUM CHLORIDE 0.9 % IV SOLN
INTRAVENOUS | Status: DC
  Administered 2012-10-15: 125 mL/h via INTRAVENOUS

## 2012-10-15 MED ORDER — CHARCOAL ACTIVATED PO LIQD
50.0000 g | Freq: Once | ORAL | Status: AC
  Administered 2012-10-15: 50 g via ORAL
  Filled 2012-10-15: qty 240

## 2012-10-15 MED ORDER — POTASSIUM CHLORIDE CRYS ER 20 MEQ PO TBCR
40.0000 meq | EXTENDED_RELEASE_TABLET | Freq: Once | ORAL | Status: AC
  Administered 2012-10-15: 40 meq via ORAL
  Filled 2012-10-15: qty 2

## 2012-10-15 MED ORDER — SODIUM CHLORIDE 0.9 % IV BOLUS (SEPSIS)
1000.0000 mL | Freq: Once | INTRAVENOUS | Status: AC
  Administered 2012-10-15: 1000 mL via INTRAVENOUS

## 2012-10-15 NOTE — ED Provider Notes (Signed)
History     CSN: [id number]  Arrival date & time 10/15/12  1939   First MD Initiated Contact with Patient 10/15/12 1956      Chief Complaint  Patient presents with  . Drug Overdose    (Consider location/radiation/quality/duration/timing/severity/associated sxs/prior treatment) Patient is a 46 y.o. female presenting with Overdose. The history is provided by the patient and a relative.  Drug Overdose   patient here after an intentional overdose on multiple medications' approximately 45 minutes prior to arrival. Patient notes increased stress from family situation. Denies any auditory or visual hallucinations. No recent fever or chills headache or neck pain. No prior history of suicide attempt. Denies any current ingestions of illicit drugs or alcohol this evening. Patient states that she does want to go to sleep.  I spoke with patient's daughter at length and she agrees that the patient has had increased depression  Past Medical History  Diagnosis Date  . Hypertension     Past Surgical History  Procedure Date  . Cesarean section 12-24-11    '85- x1  . Knee arthroscopy 12-24-11    x2 -[facility] 12[facility]  . Tubal ligation   . Knee arthroplasty 12/25/2011    Procedure: COMPUTER ASSISTED TOTAL KNEE ARTHROPLASTY;  Surgeon: [name], MD;  Location: [facility];  Service: Orthopedics;  Laterality: Right;    History reviewed. No pertinent family history.  History  Substance Use Topics  . Smoking status: Never Smoker   . Smokeless tobacco: Not on file  . Alcohol Use: Yes     Comment: occ.-social    OB History    Grav Para Term Preterm Abortions TAB SAB Ect Mult Living                  Review of Systems  All other systems reviewed and are negative.    Allergies  Review of patient's allergies indicates no known allergies.  Home Medications   Current Outpatient Rx  Name  Route  Sig  Dispense  Refill  . ALPRAZOLAM 0.25 MG PO TABS   Oral   Take 0.25  mg by mouth daily as needed. As directed by physician         . AMOXICILLIN-POT CLAVULANATE 875-125 MG PO TABS   Oral   Take 1 tablet by mouth 2 (two) times daily.         . CELECOXIB 200 MG PO CAPS   Oral   Take 200 mg by mouth 2 (two) times daily.         . CYCLOBENZAPRINE HCL 10 MG PO TABS   Oral   Take 10 mg by mouth 3 (three) times daily as needed. Muscle spasm         . DICYCLOMINE HCL 10 MG PO CAPS   Oral   Take 10 mg by mouth 3 (three) times daily as needed. For cramping and stomach pain         . DIPHENHYDRAMINE HCL 25 MG PO CAPS   Oral   Take 25 mg by mouth every 6 (six) hours as needed. For allergies or itching         . DOCUSATE SODIUM 100 MG PO CAPS   Oral   Take 100 mg by mouth 3 (three) times daily as needed. For constipation         . ETODOLAC 400 MG PO TABS   Oral   Take 400 mg by mouth 2 (two) times daily as needed. For pain or inflammation         .  HYOSCYAMINE SULFATE 0.125 MG PO TABS   Oral   Take 0.25 mg by mouth every 4 (four) hours as needed. For stomach cramping         . IBUPROFEN 800 MG PO TABS   Oral   Take 800 mg by mouth every 8 (eight) hours as needed. For pain         . LISINOPRIL-HYDROCHLOROTHIAZIDE 20-12.5 MG PO TABS   Oral   Take 1 tablet by mouth daily.         . LUBIPROSTONE 8 MCG PO CAPS   Oral   Take 8 mcg by mouth 3 (three) times daily.         Marland Kitchen METHOCARBAMOL 500 MG PO TABS   Oral   Take 500 mg by mouth every 8 (eight) hours as needed. For muscle spasm         . NABUMETONE 750 MG PO TABS   Oral   Take 750 mg by mouth 2 (two) times daily as needed. For inflammation         . OLMESARTAN MEDOXOMIL-HCTZ 20-12.5 MG PO TABS   Oral   Take 1 tablet by mouth daily.         Marland Kitchen OMEPRAZOLE 20 MG PO CPDR   Oral   Take 20 mg by mouth 2 (two) times daily.          Marland Kitchen OMEPRAZOLE-SODIUM BICARBONATE 20-1100 MG PO CAPS   Oral   Take 1 capsule by mouth daily before breakfast.         . ONDANSETRON  HCL 4 MG PO TABS   Oral   Take 4 mg by mouth every 8 (eight) hours as needed. For nausea         . PROMETHAZINE HCL 12.5 MG PO TABS   Oral   Take 12.5 mg by mouth every 6 (six) hours as needed. For nausea         . SENNOSIDES 25 MG PO TABS   Oral   Take 25 mg by mouth 2 (two) times daily as needed. For constipation         . TRAMADOL HCL 50 MG PO TABS   Oral   Take 50 mg by mouth every 6 (six) hours as needed. Pain         . RIVAROXABAN 10 MG PO TABS   Oral   Take 1 tablet (10 mg total) by mouth daily with breakfast.   10 tablet   0     BP 143/104  Temp 98.7 F (37.1 C) (Oral)  Resp 22  SpO2 98%  Physical Exam  Nursing note and vitals reviewed. Constitutional: She is oriented to person, place, and time. She appears well-developed and well-nourished.  Non-toxic appearance. No distress.  HENT:  Head: Normocephalic and atraumatic.  Eyes: Conjunctivae normal, EOM and lids are normal. Pupils are equal, round, and reactive to light.  Neck: Normal range of motion. Neck supple. No tracheal deviation present. No mass present.  Cardiovascular: Regular rhythm and normal heart sounds.  Tachycardia present.  Exam reveals no gallop.   No murmur heard. Pulmonary/Chest: Effort normal and breath sounds normal. No stridor. No respiratory distress. She has no decreased breath sounds. She has no wheezes. She has no rhonchi. She has no rales.  Abdominal: Soft. Normal appearance and bowel sounds are normal. She exhibits no distension. There is no tenderness. There is no rebound and no CVA tenderness.  Musculoskeletal: Normal range of motion. She exhibits no edema and  no tenderness.  Neurological: She is alert and oriented to person, place, and time. She has normal strength. No cranial nerve deficit or sensory deficit. GCS eye subscore is 4. GCS verbal subscore is 5. GCS motor subscore is 6.  Skin: Skin is warm and dry. No abrasion and no rash noted.  Psychiatric: Her affect is blunt.  Her speech is tangential. She is slowed. She expresses suicidal ideation. She expresses suicidal plans.    ED Course  Procedures (including critical care time)  Labs Reviewed  CBC - Abnormal; Notable for the following:    RBC 6.79 (*)     MCV 66.6 (*)     MCH 21.4 (*)     All other components within normal limits  ACETAMINOPHEN LEVEL  COMPREHENSIVE METABOLIC PANEL  ETHANOL  SALICYLATE LEVEL  URINE RAPID DRUG SCREEN (HOSP PERFORMED)  PREGNANCY, URINE   No results found.   No diagnosis found.    MDM   Date: 10/15/2012  Rate: 137  Rhythm: sinus tachycardia  QRS Axis: normal  Intervals: normal  ST/T Wave abnormalities: normal  Conduction Disutrbances:none  Narrative Interpretation:   Old EKG Reviewed: none available  Pt given charcoal because her ingestion was 45 minutes prior to arrival. She was reassessed multiple times and was noted to have airway. Her tachycardia remains. She was given IV fluids for this. She will be admitted   CRITICAL CARE Performed by: Toy Baker   Total critical care time: 67  Critical care time was exclusive of separately billable procedures and treating other patients.  Critical care was necessary to treat or prevent imminent or life-threatening deterioration.  Critical care was time spent personally by me on the following activities: development of treatment plan with patient and/or surrogate as well as nursing, discussions with consultants, evaluation of patient's response to treatment, examination of patient, obtaining history from patient or surrogate, ordering and performing treatments and interventions, ordering and review of laboratory studies, ordering and review of radiographic studies, pulse oximetry and re-evaluation of patient's condition.           Toy Baker, MD 10/15/12 2150

## 2012-10-15 NOTE — ED Notes (Signed)
Sitter at bedside, pts bag of medications taken home by pts daughter.

## 2012-10-15 NOTE — ED Notes (Addendum)
Pt arrived by POV with her family which stated she had overdosed on prescriptions medication.  Family brought a bag with multiple bottles of pills.  Pt has a patent airway.  Pt is tachycardic.  Pt is arouseable. Pt responds to verbal stimulus. Pt is able to state that she took flexeril and ambien.

## 2012-10-15 NOTE — H&P (Signed)
[facility] History and Physical  Deanna Rodgers ZOX:[id number] DOB: [birth year] DOA: 10/15/2012  Referring physician: ED PCP: No primary provider on file.  Specialists: None  Chief Complaint: OD  HPI: Deanna Rodgers is a 46 y.o. female who presents to the ED after an intentional OD of multiple medications ~ 45 min PTA.  Increased stress from family situation lead her to attempt to commit suicide due to increased depression.  No prior h/o suicide attempts.  In the ED patient was given activated charcol, noted to be lethargic but able to arouse and tachycardic.  Hospitalist has been asked to admit for obs.  Review of Systems: 12 systems reviewed and otherwise negative.  Past Medical History  Diagnosis Date  . Hypertension    Past Surgical History  Procedure Date  . Cesarean section 12-24-11    '85- x1  . Knee arthroscopy 12-24-11    [facility] -[facility]  . Tubal ligation   . Knee arthroplasty 12/25/2011    Procedure: COMPUTER ASSISTED TOTAL KNEE ARTHROPLASTY;  Surgeon: [name], MD;  Location: [facility];  Service: Orthopedics;  Laterality: Right;   Social History:  reports that she has never smoked. She does not have any smokeless tobacco history on file. She reports that she drinks alcohol. She reports that she does not use illicit drugs.   No Known Allergies  History reviewed. No pertinent family history.  Prior to Admission medications   Medication Sig Start Date End Date Taking? Authorizing Provider  ALPRAZolam (XANAX) 0.25 MG tablet Take 0.25 mg by mouth daily as needed. As directed by physician   Yes Historical Provider, MD  celecoxib (CELEBREX) 200 MG capsule Take 200 mg by mouth 2 (two) times daily.   Yes Historical Provider, MD  cyclobenzaprine (FLEXERIL) 10 MG tablet Take 10 mg by mouth 3 (three) times daily as needed. Muscle spasm   Yes Historical Provider, MD  dicyclomine (BENTYL) 10 MG capsule Take 10 mg by mouth 3 (three) times daily  as needed. For cramping and stomach pain   Yes Historical Provider, MD  etodolac (LODINE) 400 MG tablet Take 400 mg by mouth 2 (two) times daily as needed. For pain or inflammation   Yes Historical Provider, MD  hyoscyamine (LEVSIN, ANASPAZ) 0.125 MG tablet Take 0.25 mg by mouth every 4 (four) hours as needed. For stomach cramping   Yes Historical Provider, MD  lisinopril-hydrochlorothiazide (PRINZIDE,ZESTORETIC) 20-12.5 MG per tablet Take 1 tablet by mouth daily.   Yes Historical Provider, MD  lubiprostone (AMITIZA) 8 MCG capsule Take 8 mcg by mouth 3 (three) times daily.   Yes Historical Provider, MD  methocarbamol (ROBAXIN) 500 MG tablet Take 500 mg by mouth every 8 (eight) hours as needed. For muscle spasm   Yes Historical Provider, MD  nabumetone (RELAFEN) 750 MG tablet Take 750 mg by mouth 2 (two) times daily as needed. For inflammation   Yes Historical Provider, MD  olmesartan-hydrochlorothiazide (BENICAR HCT) 20-12.5 MG per tablet Take 1 tablet by mouth daily.   Yes Historical Provider, MD  traMADol (ULTRAM) 50 MG tablet Take 50 mg by mouth every 6 (six) hours as needed. Pain   Yes Historical Provider, MD  zolpidem (AMBIEN) 10 MG tablet Take 10 mg by mouth at bedtime as needed. For sleep   Yes Historical Provider, MD   Physical Exam: Filed Vitals:   10/15/12 2100 10/15/12 2130 10/15/12 2200 10/15/12 2245  BP: 130/99 138/126 116/61 128/72  Pulse:  127    Temp:  98.3 F (36.8 C)  TempSrc:    Oral  Resp: 16 19 19 25   SpO2:  97%  100%    General:  NAD, resting comfortably in bed Eyes: PEERLA EOMI ENT: mucous membranes moist Neck: supple w/o JVD Cardiovascular: RRR w/o MRG Respiratory: CTA B Abdomen: soft, nt, nd, bs+ Skin: no rash nor lesion Musculoskeletal: MAE, full ROM all 4 extremities Psychiatric: normal tone and affect Neurologic: lethargic but awakens to voice and follows commands, responds to questions appropriately  Labs on Admission:  Basic Metabolic Panel:  Lab  10/15/12 1946  NA 135  K 3.1*  CL 95*  CO2 26  GLUCOSE 93  BUN 15  CREATININE 0.62  CALCIUM 9.9  MG --  PHOS --   Liver Function Tests:  Lab 10/15/12 1946  AST 12  ALT 12  ALKPHOS 85  BILITOT 0.4  PROT 8.5*  ALBUMIN 4.3   No results found for this basename: LIPASE:5,AMYLASE:5 in the last 168 hours No results found for this basename: AMMONIA:5 in the last 168 hours CBC:  Lab 10/15/12 1946  WBC 6.8  NEUTROABS --  HGB 14.5  HCT 45.2  MCV 66.6*  PLT 243   Cardiac Enzymes: No results found for this basename: CKTOTAL:5,CKMB:5,CKMBINDEX:5,TROPONINI:5 in the last 168 hours  BNP (last 3 results) No results found for this basename: PROBNP:3 in the last 8760 hours CBG: No results found for this basename: GLUCAP:5 in the last 168 hours  Radiological Exams on Admission: No results found.  EKG: Independently reviewed.  Assessment/Plan Principal Problem:  *Overdose   1. OD - attempted suicide, stopping all home medications, patient got activated charcoal, sending to tele with sitter, cont pulse ox, patient is arrousable and responds appropriately at this point, no indications for intubation at this time.  Will need psych eval when ready.  No consults obtained.  Code Status: Full Code (must indicate code status--if unknown or must be presumed, indicate so) Family Communication: No family in room (indicate person spoken with, if applicable, with phone number if by telephone) Disposition Plan: Admit to obs (indicate anticipated LOS)  Time spent: 70 min  Joshwa Hemric M. [facility] Pager [phone]  If 7PM-7AM, please contact night-coverage [email or web address] Password [email or web address] 10/15/2012, 10:50 PM

## 2012-10-16 DIAGNOSIS — M1710 Unilateral primary osteoarthritis, unspecified knee: Secondary | ICD-10-CM

## 2012-10-16 DIAGNOSIS — I10 Essential (primary) hypertension: Secondary | ICD-10-CM

## 2012-10-16 DIAGNOSIS — IMO0002 Reserved for concepts with insufficient information to code with codable children: Secondary | ICD-10-CM

## 2012-10-16 DIAGNOSIS — R1013 Epigastric pain: Secondary | ICD-10-CM

## 2012-10-16 DIAGNOSIS — K3189 Other diseases of stomach and duodenum: Secondary | ICD-10-CM

## 2012-10-16 DIAGNOSIS — F4321 Adjustment disorder with depressed mood: Secondary | ICD-10-CM

## 2012-10-16 DIAGNOSIS — K589 Irritable bowel syndrome without diarrhea: Secondary | ICD-10-CM

## 2012-10-16 LAB — CBC
HCT: 36.2 % (ref 36.0–46.0)
Hemoglobin: 11.6 g/dL — ABNORMAL LOW (ref 12.0–15.0)
MCH: 21.4 pg — ABNORMAL LOW (ref 26.0–34.0)
MCHC: 32 g/dL (ref 30.0–36.0)
MCV: 66.7 fL — ABNORMAL LOW (ref 78.0–100.0)
Platelets: 203 10*3/uL (ref 150–400)
RBC: 5.43 MIL/uL — ABNORMAL HIGH (ref 3.87–5.11)
RDW: 15 % (ref 11.5–15.5)
WBC: 7.3 10*3/uL (ref 4.0–10.5)

## 2012-10-16 LAB — BASIC METABOLIC PANEL
BUN: 9 mg/dL (ref 6–23)
CO2: 25 mEq/L (ref 19–32)
Calcium: 8.6 mg/dL (ref 8.4–10.5)
Chloride: 100 mEq/L (ref 96–112)
Creatinine, Ser: 0.54 mg/dL (ref 0.50–1.10)
GFR calc Af Amer: >90 mL/min (ref >90)
GFR calc non Af Amer: >90 mL/min (ref >90)
Glucose, Bld: 94 mg/dL (ref 70–99)
Potassium: 3.3 mEq/L — ABNORMAL LOW (ref 3.5–5.1)
Sodium: 135 mEq/L (ref 135–145)

## 2012-10-16 MED ORDER — ZOLPIDEM TARTRATE 5 MG PO TABS: 5.0000 mg | ORAL_TABLET | Freq: Every evening | ORAL | Status: DC | PRN

## 2012-10-16 MED ORDER — HYOSCYAMINE SULFATE 0.125 MG PO TABS
0.2500 mg | ORAL_TABLET | ORAL | Status: DC | PRN
  Filled 2012-10-16: qty 2

## 2012-10-16 MED ORDER — METHOCARBAMOL 500 MG PO TABS
500.0000 mg | ORAL_TABLET | Freq: Three times a day (TID) | ORAL | Status: DC | PRN
  Administered 2012-10-17: 500 mg via ORAL
  Filled 2012-10-16: qty 1

## 2012-10-16 MED ORDER — CYCLOBENZAPRINE HCL 10 MG PO TABS
10.0000 mg | ORAL_TABLET | Freq: Three times a day (TID) | ORAL | Status: DC | PRN
  Filled 2012-10-16: qty 1

## 2012-10-16 MED ORDER — LISINOPRIL 20 MG PO TABS
20.0000 mg | ORAL_TABLET | Freq: Every day | ORAL | Status: DC
  Administered 2012-10-17: 20 mg via ORAL
  Filled 2012-10-16: qty 1

## 2012-10-16 MED ORDER — ALPRAZOLAM 0.25 MG PO TABS
0.2500 mg | ORAL_TABLET | Freq: Every day | ORAL | Status: DC | PRN
Start: 2012-10-17 — End: 2012-10-17
  Administered 2012-10-17: 0.25 mg via ORAL
  Filled 2012-10-16: qty 1

## 2012-10-16 MED ORDER — NABUMETONE 750 MG PO TABS
750.0000 mg | ORAL_TABLET | Freq: Two times a day (BID) | ORAL | Status: DC | PRN
  Administered 2012-10-16: 750 mg via ORAL
  Filled 2012-10-16: qty 1

## 2012-10-16 MED ORDER — CELECOXIB 200 MG PO CAPS
200.0000 mg | ORAL_CAPSULE | Freq: Two times a day (BID) | ORAL | Status: DC
  Administered 2012-10-17: 200 mg via ORAL
  Filled 2012-10-16 (×2): qty 1

## 2012-10-16 MED ORDER — IRBESARTAN 150 MG PO TABS: 150.0000 mg | ORAL_TABLET | Freq: Every day | ORAL | Status: DC

## 2012-10-16 MED ORDER — TRAMADOL HCL 50 MG PO TABS
50.0000 mg | ORAL_TABLET | Freq: Four times a day (QID) | ORAL | Status: DC | PRN
  Administered 2012-10-16 – 2012-10-17 (×2): 50 mg via ORAL
  Filled 2012-10-16 (×2): qty 1

## 2012-10-16 MED ORDER — HYDROCHLOROTHIAZIDE 12.5 MG PO CAPS
12.5000 mg | ORAL_CAPSULE | Freq: Every day | ORAL | Status: DC
  Administered 2012-10-17: 12.5 mg via ORAL
  Filled 2012-10-16: qty 1

## 2012-10-16 MED ORDER — DICYCLOMINE HCL 10 MG PO CAPS
10.0000 mg | ORAL_CAPSULE | Freq: Three times a day (TID) | ORAL | Status: DC | PRN
  Filled 2012-10-16: qty 1

## 2012-10-16 MED ORDER — POTASSIUM CHLORIDE CRYS ER 20 MEQ PO TBCR
40.0000 meq | EXTENDED_RELEASE_TABLET | Freq: Once | ORAL | Status: AC
  Administered 2012-10-16: 40 meq via ORAL
  Filled 2012-10-16: qty 2

## 2012-10-16 MED ORDER — HYDROCHLOROTHIAZIDE 12.5 MG PO CAPS: 12.5000 mg | ORAL_CAPSULE | Freq: Every day | ORAL | Status: DC

## 2012-10-16 MED ORDER — LISINOPRIL-HYDROCHLOROTHIAZIDE 20-12.5 MG PO TABS: 1.0000 | ORAL_TABLET | Freq: Every day | ORAL | Status: DC

## 2012-10-16 MED ORDER — OLMESARTAN MEDOXOMIL-HCTZ 20-12.5 MG PO TABS: 1.0000 | ORAL_TABLET | Freq: Every day | ORAL | Status: DC

## 2012-10-16 MED ORDER — SODIUM CHLORIDE 0.9 % IV SOLN
INTRAVENOUS | Status: DC
  Administered 2012-10-16: 16:00:00 via INTRAVENOUS

## 2012-10-16 MED ORDER — ACETAMINOPHEN 325 MG PO TABS
650.0000 mg | ORAL_TABLET | Freq: Four times a day (QID) | ORAL | Status: DC | PRN
  Filled 2012-10-16: qty 2

## 2012-10-16 MED ORDER — LUBIPROSTONE 8 MCG PO CAPS
8.0000 ug | ORAL_CAPSULE | Freq: Three times a day (TID) | ORAL | Status: DC
  Administered 2012-10-16 – 2012-10-17 (×3): 8 ug via ORAL
  Filled 2012-10-16 (×4): qty 1

## 2012-10-16 NOTE — Progress Notes (Signed)
[facility] PROGRESS NOTE  Deanna Rodgers:[id number] DOB: 1966-07-21 DOA: 10/15/2012 PCP: No primary provider on file.  Assessment/Plan: Overdose - attempted suicide on home medications Patient is not a reliable historian but at this time indicated to me that she took 6 tabs of her flexeril, 4 tabs of etodolac, 6 tabs of lisinopril/HCTZ, 4 tabs of robaxin, 4 tabs of nabumetone, and 4 tabs of Ambien yesterday at 5 PM. Discussed with poison control, agreed with supportive care and was stable for discharge from a medical stand point. Psychiatry consulted, disposition pending from psychiatry. Corporate investment banker. No events on telemetry (QTC 0.37 ms this morning). Patient is appropriate during my conversation with the patient at this time.  Hypertension Stable, restart home medications tomorrow.  Anxiety Start xanax tomorrow. Stable.  History of irritable bowel syndrome Start PRN dicyclomine and hyoscyamine tomorrow.  Insomnia Start Ambien tomorrow as needed for sleep.  GERD Stable.  Degenerative arthritis of knee Stable, restart PRN cyclobenzaprine, methocarbamol, and nabumetone tomorrow.  Code Status: Full code Family Communication: No family at bedside. Patient updated.  Disposition Plan: Medically stable for discharge, pending psychiatry evaluation.  Consultants:  Psychiatry  Procedures:  None  Antibiotics:  None.  HPI/Subjective: No specific concerns. Denies any suicidal thoughts or ideation at this time. Yesterday she overdosed due to some issues at home.  Objective: Filed Vitals:   10/15/12 2300 10/15/12 2315 10/15/12 2345 10/16/12 0512  BP: 139/110 128/84 139/80 114/72  Pulse:  118 120 100  Temp:   98.1 F (36.7 C) 97.6 F (36.4 C)  TempSrc:   Oral Oral  Resp: 22 22 20 16   Weight:    74.5 kg (164 lb 3.9 oz)  SpO2:  98% 100% 100%    Intake/Output Summary (Last 24 hours) at 10/16/12 1012 Last data filed at 10/16/12 0514  Gross per 24 hour   Intake      0 ml  Output   1050 ml  Net  -1050 ml   Filed Weights   10/16/12 0512  Weight: 74.5 kg (164 lb 3.9 oz)    Exam: Physical Exam: General: Awake, Oriented, No acute distress. HEENT: EOMI. Neck: Supple CV: S1 and S2 Lungs: Clear to ascultation bilaterally Abdomen: Soft, Nontender, Nondistended, +bowel sounds. Ext: Good pulses. Trace edema.  Data Reviewed: Basic Metabolic Panel:  Lab 10/15/12 9562  NA 135  K 3.1*  CL 95*  CO2 26  GLUCOSE 93  BUN 15  CREATININE 0.62  CALCIUM 9.9  MG --  PHOS --   Liver Function Tests:  Lab 10/15/12 1946  AST 12  ALT 12  ALKPHOS 85  BILITOT 0.4  PROT 8.5*  ALBUMIN 4.3   No results found for this basename: LIPASE:5,AMYLASE:5 in the last 168 hours No results found for this basename: AMMONIA:5 in the last 168 hours CBC:  Lab 10/15/12 1946  WBC 6.8  NEUTROABS --  HGB 14.5  HCT 45.2  MCV 66.6*  PLT 243   Cardiac Enzymes: No results found for this basename: CKTOTAL:5,CKMB:5,CKMBINDEX:5,TROPONINI:5 in the last 168 hours BNP (last 3 results) No results found for this basename: PROBNP:3 in the last 8760 hours CBG: No results found for this basename: GLUCAP:5 in the last 168 hours  No results found for this or any previous visit (from the past 240 hour(s)).   Studies: No results found.  Scheduled Meds:   . potassium chloride  40 mEq Oral Once  . sodium chloride  3 mL Intravenous Q12H   Continuous Infusions:   .  sodium chloride 125 mL/hr (10/15/12 2146)    Principal Problem:  *Overdose Active Problems:  DYSPEPSIA&OTHER SPEC DISORDERS FUNCTION STOMACH  GERD  Irritable bowel syndrome  Degenerative arthritis of knee  Hypertension  Time spent: 20 mins  Deanna Rodgers A  [facility] Pager [phone]. If 7PM-7AM, please contact night-coverage at [email or web address], password [facility] 10/16/2012, 10:12 AM  LOS: 1 day

## 2012-10-16 NOTE — Consult Note (Signed)
Reason for Consult:OD, SI Referring Physician: dr. reddy  Deanna Rodgers is an 46 y.o. female.  HPI:    Deanna Rodgers is a 46 y.o. female who presents to the ED after an intentional OD of multiple medications (flexril, lisisonpril, etc). Very reluctant to talk in detail about her stressors today and crying a lot.  Increased stress from family situation lead her to attempt to commit suicide due to increased depression per pt. Most of the conflict revolves around her children who do not listen to her in their life decisions. Due to this conflict she cant see her grand Childrens these days that makes her more sad. Some of this conflict revolve around their sexual preferences which does not approve. Denies feeling more sad and hopeless these days. Denies any anxiety or psychotic symptoms. In the recent past she had hot flashes and was given xanax. Thinks it seems to help some. Being unemplyed also not helping her situation. She reported her OD was an attempt to kill herself. She is not sure how to deal with her life stressors now.    No prior h/o suicide attempts.    Past Medical History  Diagnosis Date  . Hypertension     Past Surgical History  Procedure Date  . Cesarean section 12-24-11    '85- x1  . Knee arthroscopy 12-24-11    [facility] -[facility]  . Tubal ligation   . Knee arthroplasty 12/25/2011    Procedure: COMPUTER ASSISTED TOTAL KNEE ARTHROPLASTY;  Surgeon: [name], MD;  Location: [facility];  Service: Orthopedics;  Laterality: Right;    History reviewed. No pertinent family history.  Social History:  reports that she has never smoked. She does not have any smokeless tobacco history on file. She reports that she drinks alcohol. She reports that she does not use illicit drugs. unemployed these days, daughter helps with finances these days. Allergies: No Known Allergies  Medications: I have reviewed the patient's current medications.  Results for  orders placed during the hospital encounter of 10/15/12 (from the past 48 hour(s))  ACETAMINOPHEN LEVEL     Status: Normal   Collection Time   10/15/12  7:46 PM      Component Value Range Comment   Acetaminophen (Tylenol), Serum <15.0  10 - 30 ug/mL   CBC     Status: Abnormal   Collection Time   10/15/12  7:46 PM      Component Value Range Comment   WBC 6.8  4.0 - 10.5 K/uL    RBC 6.79 (*) 3.87 - 5.11 MIL/uL    Hemoglobin 14.5  12.0 - 15.0 g/dL    HCT 95.6  21.3 - 08.6 %    MCV 66.6 (*) 78.0 - 100.0 fL    MCH 21.4 (*) 26.0 - 34.0 pg    MCHC 32.1  30.0 - 36.0 g/dL    RDW 57.8  46.9 - 62.9 %    Platelets 243  150 - 400 K/uL   COMPREHENSIVE METABOLIC PANEL     Status: Abnormal   Collection Time   10/15/12  7:46 PM      Component Value Range Comment   Sodium 135  135 - 145 mEq/L    Potassium 3.1 (*) 3.5 - 5.1 mEq/L    Chloride 95 (*) 96 - 112 mEq/L    CO2 26  19 - 32 mEq/L    Glucose, Bld 93  70 - 99 mg/dL    BUN 15  6 -  23 mg/dL    Creatinine, Ser 9.60  0.50 - 1.10 mg/dL    Calcium 9.9  8.4 - 45.4 mg/dL    Total Protein 8.5 (*) 6.0 - 8.3 g/dL    Albumin 4.3  3.5 - 5.2 g/dL    AST 12  0 - 37 U/L    ALT 12  0 - 35 U/L    Alkaline Phosphatase 85  39 - 117 U/L    Total Bilirubin 0.4  0.3 - 1.2 mg/dL    GFR calc non Af Amer >90  >90 mL/min    GFR calc Af Amer >90  >90 mL/min   ETHANOL     Status: Normal   Collection Time   10/15/12  7:46 PM      Component Value Range Comment   Alcohol, Ethyl (B) <11  0 - 11 mg/dL   SALICYLATE LEVEL     Status: Abnormal   Collection Time   10/15/12  7:46 PM      Component Value Range Comment   Salicylate Lvl <2.0 (*) 2.8 - 20.0 mg/dL   URINE RAPID DRUG SCREEN (HOSP PERFORMED)     Status: Normal   Collection Time   10/15/12  8:00 PM      Component Value Range Comment   Opiates NONE DETECTED  NONE DETECTED    Cocaine NONE DETECTED  NONE DETECTED    Benzodiazepines NONE DETECTED  NONE DETECTED    Amphetamines NONE DETECTED  NONE DETECTED     Tetrahydrocannabinol NONE DETECTED  NONE DETECTED    Barbiturates NONE DETECTED  NONE DETECTED   PREGNANCY, URINE     Status: Normal   Collection Time   10/15/12  8:00 PM      Component Value Range Comment   Preg Test, Ur NEGATIVE  NEGATIVE   CBC     Status: Abnormal   Collection Time   10/16/12 11:11 AM      Component Value Range Comment   WBC 7.3  4.0 - 10.5 K/uL    RBC 5.43 (*) 3.87 - 5.11 MIL/uL    Hemoglobin 11.6 (*) 12.0 - 15.0 g/dL    HCT 09.8  11.9 - 14.7 %    MCV 66.7 (*) 78.0 - 100.0 fL    MCH 21.4 (*) 26.0 - 34.0 pg    MCHC 32.0  30.0 - 36.0 g/dL    RDW 82.9  56.2 - 13.0 %    Platelets 203  150 - 400 K/uL   BASIC METABOLIC PANEL     Status: Abnormal   Collection Time   10/16/12 11:11 AM      Component Value Range Comment   Sodium 135  135 - 145 mEq/L    Potassium 3.3 (*) 3.5 - 5.1 mEq/L    Chloride 100  96 - 112 mEq/L    CO2 25  19 - 32 mEq/L    Glucose, Bld 94  70 - 99 mg/dL    BUN 9  6 - 23 mg/dL    Creatinine, Ser 8.65  0.50 - 1.10 mg/dL    Calcium 8.6  8.4 - 78.4 mg/dL    GFR calc non Af Amer >90  >90 mL/min    GFR calc Af Amer >90  >90 mL/min     No results found.  ROS Blood pressure 114/72, pulse 100, temperature 97.6 F (36.4 C), temperature source Oral, resp. rate 16, weight 74.5 kg (164 lb 3.9 oz), SpO2 100.00%. Physical Exam   Mental Status  Examination/Evaluation:  Appearance: on bed crying at times  Eye Contact:: poor  Speech: slow  Volume: low  Mood: depressed  Affect: ristricted  Thought Process: organized  Orientation: Full  Thought Content: NO AVH  Suicidal Thoughts: No  Homicidal Thoughts: no  Memory:fair  Judgement: Impaired  Insight: Lacking  Psychomotor Activity: Normal  Concentration: Fair  Recall: Fair  Akathisia: No  Assessment:  AXIS I: Adjustment d/o wit depressed mood, r/o  MDD  AXIS II: Deferred  AXIS III: see emdical hx ? ?  ? ?  ?  ? ?  ?  ? ?  ?  ? ?  ?  ?  AXIS IV: conflict with  family, unemployed  AXIS V: 30  ?  Treatment Plan/Recommendations:  1. will recommend psy in pt for safety, further evaluation and treatment at this time after medical clearance  2. Will recommend TSH and Zoloft staring 37.5 mg QAM to target depressive symptoms   2. Will sign off now. Thanks for consult   Wonda Cerise 10/16/2012, 3:02 PM

## 2012-10-17 ENCOUNTER — Encounter (HOSPITAL_COMMUNITY): Payer: Self-pay

## 2012-10-17 DIAGNOSIS — K219 Gastro-esophageal reflux disease without esophagitis: Secondary | ICD-10-CM

## 2012-10-17 DIAGNOSIS — F329 Major depressive disorder, single episode, unspecified: Secondary | ICD-10-CM

## 2012-10-17 LAB — TSH: TSH: 2.44 u[IU]/mL (ref 0.350–4.500)

## 2012-10-17 MED ORDER — SERTRALINE HCL 50 MG PO TABS: 50.0000 mg | ORAL_TABLET | Freq: Every day | ORAL | Status: DC

## 2012-10-17 MED ORDER — POTASSIUM CHLORIDE CRYS ER 20 MEQ PO TBCR
40.0000 meq | EXTENDED_RELEASE_TABLET | ORAL | Status: AC
Start: 2012-10-17 — End: 2012-10-17
  Administered 2012-10-17 (×2): 40 meq via ORAL
  Filled 2012-10-17 (×2): qty 2

## 2012-10-17 MED ORDER — SERTRALINE HCL 50 MG PO TABS
50.0000 mg | ORAL_TABLET | Freq: Every day | ORAL | Status: DC
  Administered 2012-10-17: 50 mg via ORAL
  Filled 2012-10-17: qty 1

## 2012-10-17 MED ORDER — SERTRALINE HCL 25 MG PO TABS: 37.5000 mg | ORAL_TABLET | Freq: Every day | ORAL | Status: DC

## 2012-10-17 NOTE — Progress Notes (Signed)
Provided Pt with information on [facility], should psych MD say Pt is ok to d/c home.  Providence Crosby, LCSWA Clinical Social Work [phone]

## 2012-10-17 NOTE — Progress Notes (Signed)
Progress Note f/u consultation note

## 2012-10-17 NOTE — Progress Notes (Signed)
Patient Identification:  Deanna Rodgers Date of Evaluation:  10/17/2012 Reason for Consult:  Overdose Referring Provider:   Dr. Betti Cruz  History of Present Illness: Overdose  Of her prescribed medications 3 meds; 3 tabs each.  She says there have been several stressors recently that caused her to take the overdose. She is now stable and wants to go home.    Past Psychiatric History:  She says she she has never had suicidal thoughts.  Stress from losing her jog. She denies history of using drugs.  She drinks occasional alcohol   Past Medical History:     Past Medical History  Diagnosis Date  . Hypertension        Past Surgical History  Procedure Date  . Cesarean section 12-24-11    '85- x1  . Knee arthroscopy 12-24-11    [facility] -[facility]  . Tubal ligation   . Knee arthroplasty 12/25/2011    Procedure: COMPUTER ASSISTED TOTAL KNEE ARTHROPLASTY;  Surgeon: [name], MD;  Location: [facility];  Service: Orthopedics;  Laterality: Right;    Allergies: No Known Allergies  Current Medications:  Prior to Admission medications   Medication Sig Start Date End Date Taking? Authorizing Provider  ALPRAZolam (XANAX) 0.25 MG tablet Take 0.25 mg by mouth daily as needed. As directed by physician   Yes Historical Provider, MD  celecoxib (CELEBREX) 200 MG capsule Take 200 mg by mouth 2 (two) times daily.   Yes Historical Provider, MD  cyclobenzaprine (FLEXERIL) 10 MG tablet Take 10 mg by mouth 3 (three) times daily as needed. Muscle spasm   Yes Historical Provider, MD  dicyclomine (BENTYL) 10 MG capsule Take 10 mg by mouth 3 (three) times daily as needed. For cramping and stomach pain   Yes Historical Provider, MD  etodolac (LODINE) 400 MG tablet Take 400 mg by mouth 2 (two) times daily as needed. For pain or inflammation   Yes Historical Provider, MD  hyoscyamine (LEVSIN, ANASPAZ) 0.125 MG tablet Take 0.25 mg by mouth every 4 (four) hours as needed. For stomach cramping    Yes Historical Provider, MD  lisinopril-hydrochlorothiazide (PRINZIDE,ZESTORETIC) 20-12.5 MG per tablet Take 1 tablet by mouth daily.   Yes Historical Provider, MD  lubiprostone (AMITIZA) 8 MCG capsule Take 8 mcg by mouth 3 (three) times daily.   Yes Historical Provider, MD  methocarbamol (ROBAXIN) 500 MG tablet Take 500 mg by mouth every 8 (eight) hours as needed. For muscle spasm   Yes Historical Provider, MD  nabumetone (RELAFEN) 750 MG tablet Take 750 mg by mouth 2 (two) times daily as needed. For inflammation   Yes Historical Provider, MD  traMADol (ULTRAM) 50 MG tablet Take 50 mg by mouth every 6 (six) hours as needed. Pain   Yes Historical Provider, MD  zolpidem (AMBIEN) 10 MG tablet Take 10 mg by mouth at bedtime as needed. For sleep   Yes Historical Provider, MD    Social History:    reports that she has never smoked. She does not have any smokeless tobacco history on file. She reports that she drinks alcohol. She reports that she does not use illicit drugs.   Family History:    History reviewed. No pertinent family history.  Mental Status Examination/Evaluation: Objective:  Appearance: Casual  Eye Contact::  Good  Speech:  Clear and Coherent  Volume:  Normal  Mood:  Less depressed  Affect:  Appropriate  Thought Process:  Coherent, Goal Directed, Logical and has plans  Orientation:  Full (  Time, Place, and Person)  Thought Content:  NA  Suicidal Thoughts:  No  Homicidal Thoughts:  No  Judgement:  Fair  Insight:  Fair   DIAGNOSIS:   AXIS I  Major Depressive Disorder, medication overdose  AXIS II  Deferred  AXIS III See medical notes.  AXIS IV economic problems, housing problems, occupational problems, other psychosocial or environmental problems, problems related to social environment, problems with primary support group and financial problems and stresses of young adult daughter with emergency induction of birth.  S.O. a woman, makes the rules and sent  pt (grandmother)  home.  Daughter will not speak with her or answer the phone.   AXIS V 51-60 moderate symptoms   Assessment/Plan:  Discussed with Dr. Betti Cruz, Psych CSW Pt is calm, goal directed and states she realizes her young adult daughter, new mother, has  To grow up on her own.  She recognizes that is very difficult to be shut out of her life.  Pt requests outpt referral to psychiatric medication and therapy - [facility]  She is coherent, Oriented to person date place and purpose.  She denies any thoughts of suicide or Homicide.  She has no symptoms of the overdose of medications.  She says she is ready to go home.  Other daughter with husband, children, is here with mother to take her homw.  RECOMMENDATION:  1.  Pt has capacity to follow up on outpatient referrals and medications 2.  Pt is [facility] for out patient psychiatric needs.  3.  Dr. Betti Cruz is contacted to discharge pt. 4.  No further psyhciatric needs.  Kegan Mckeithan MD 10/17/2012 11:13 AM

## 2012-10-17 NOTE — Progress Notes (Signed)
Clinical Social Work Department CLINICAL SOCIAL WORK PSYCHIATRY SERVICE LINE ASSESSMENT 10/17/2012  Patient:  Deanna Rodgers  Account:  [id number]  Admit Date:  10/15/2012  Clinical Social Worker:  Doroteo Glassman  Date/Time:  10/17/2012 10:14 AM Referred by:  Physician  Date referred:  10/17/2012 Reason for Referral  Behavioral Health Issues   Presenting Symptoms/Problems (In the person's/family's own words):   Pt overdosed on multiple medications.    Abuse/Neglect/Trauma Comments:   Psychiatric History (check all that apply)  Denies history   Psychiatric medications:  Current Mental Health Hospitalizations/Previous Mental Health History:   Current provider:   Place and Date:   Current Medications:   Previous Impatient Admission/Date/Reason:   Emotional Health / Current Symptoms    Suicide/Self Harm  None reported   Suicide attempt in the past:   Other harmful behavior:   Psychotic/Dissociative Symptoms  None reported   Other Psychotic/Dissociative Symptoms:    Attention/Behavioral Symptoms  Within Normal Limits   Other Attention / Behavioral Symptoms:    Cognitive Impairment  Within Normal Limits   Other Cognitive Impairment:    Mood and Adjustment  Flat    Stress, Anxiety, Trauma, Any Recent Loss/Stressor  Relationship   Anxiety (frequency):   Phobia (specify):   Compulsive behavior (specify):   Obsessive behavior (specify):   Other:   Pt and her daughter are having difficulty getting along. Pt feels that her daughter's girlfriend contributes to the decline of her relationship with her daughter.   Substance Abuse/Use  None   SBIRT completed (please refer for detailed history):  N  Self-reported substance use:   Urinary Drug Screen Completed:  Y Alcohol level:    Environmental/Housing/Living Arrangement  Stable housing   Who is in the home:   Pt's adult son   Emergency contact:  Deon Cheeks   Financial  Medicaid   Patient's  Strengths and Goals (patient's own words):   Clinical Social Worker's Interpretive Summary:   Met with Pt to discuss current admission.    Pt reported that she almost lost her daughter approx a week ago due to kidney and liver pxs.  Pt's daughter was 36-weeks pregnant and the pregnancy was threatened, thus Pt's daughter was induced.  Pt stated that she drove back and forth to [place], where her daughter lives, daily until several days ago.  She stated that she wanted to take her 17-year-old grandson back to [place] with her due to not feeling like he needed to be in the ICU.  Pt's daughter's girlfriend wouldn't allow this and this upset Pt. Additionally, Pt stated that her daughter's girlfriend doesn't like when she's around, as her daughter defers to her and not her girlfriend.  Pt's daughter and the new baby are doing well.    Pt stated that she raised 5 children on her own and that she is "strong" and can take care of herself.  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  Since that time, Pt has been doing odd jobs to support herself.  She stated that she used to work for [employer] so she has a monthly income from them.  Pt stated that finances are not a source of stress for her.    Pt was adamant that she was not attempting suicide.  She stated that she hadn't been sleeping well and that she wanted to go to sleep.  Additionally, she stated that she did have thoughts that maybe her  daughter's life would be better if she wasn't here.  Pt reported a good relationship with her other children.    Pt does not want inpt tx; she agreed that she needs outpt tx and asked that CSW assist her in obtaining this level of tx.  CSW discussed [facility] with Pt.  Pt agreeable.    CSW explained to Pt that psych MD will assess Pt and make recommendations with regard to level of tx needed.    CSW thanked Pt for her time.    CSW to provide Pt with information on [facility].    Disposition:  Recommend Psych CSW continuing to support while in hospital  CSW to continue to follow.  Providence Crosby, LCSWA Clinical Social Work [phone]

## 2012-10-17 NOTE — Discharge Summary (Addendum)
Physician Discharge Summary  Deanna Rodgers:[id number] DOB: 1965-12-16 DOA: 10/15/2012  PCP: No primary provider on file.  Admit date: 10/15/2012 Discharge date: 10/17/2012  Time spent: 25 minutes  Recommendations for Outpatient Follow-up:  Please followup with PCP in 1 week.  Please followup with outpatient psychiatry in 2-3 weeks after discharge, resources provided by Child psychotherapist.  Discharge Diagnoses:  Principal Problem:  *Overdose Active Problems:  DYSPEPSIA&OTHER SPEC DISORDERS FUNCTION STOMACH  GERD  Irritable bowel syndrome  Degenerative arthritis of knee  Hypertension   Discharge Condition: Stable  Diet recommendation: Heart healthy diet.  Filed Weights   10/16/12 0512  Weight: 74.5 kg (164 lb 3.9 oz)    History of present illness:  On admission: "Deanna Rodgers is a 46 y.o. female who presents to the ED after an intentional OD of multiple medications ~ 45 min PTA. Increased stress from family situation lead her to attempt to commit suicide due to increased depression. No prior h/o suicide attempts.   In the ED patient was given activated charcol, noted to be lethargic but able to arouse and tachycardic. Hospitalist has been asked to admit for obs."  Hospital Course:  Overdose - attempted suicide on home medications Patient is not a reliable historian but on 10/16/2012 indicated that she took 6 tabs of her flexeril, 4 tabs of etodolac, 6 tabs of lisinopril/HCTZ, 4 tabs of robaxin, 4 tabs of nabumetone, and 4 tabs of Ambien yesterday at 5 PM. Discussed with poison control on 10/16/2012 was stable for discharge from a medical stand point. Psychiatry consulted, disposition pending from psychiatry at the time of this discharge summary. Was placed on sitter. No events on telemetry (QTC 0.43 ms this morning). Patient is appropriate during my conversation with the patient at this time. Per psychiatry started the patient on zoloft, to followup with outpatient  psychiatry after discharge with resources provided by social worker.  Hypertension Stable, restart home antihypertensive medications today prior to discharge.  Anxiety Stable.  History of irritable bowel syndrome Started PRN dicyclomine and hyoscyamine prior to discharge.  Insomnia Start Ambien today as needed for sleep prior to discharge.  GERD Stable.  Degenerative arthritis of knee Stable, restarted PRN cyclobenzaprine, methocarbamol, and nabumetone   Hypokalemia Replace as needed.  Consultants:  Psychiatry Dr. Ferol Luz and Dr. Theotis Barrio.  Procedures:  None  Antibiotics:  None.  Discharge Exam: Filed Vitals:   10/15/12 2345 10/16/12 0512 10/16/12 2132 10/17/12 0636  BP: 139/80 114/72 122/76 127/80  Pulse: 120 100 118 97  Temp: 98.1 F (36.7 C) 97.6 F (36.4 C) 98.1 F (36.7 C) 97.8 F (36.6 C)  TempSrc: Oral Oral Oral Oral  Resp: 20 16 20 18   Weight:  74.5 kg (164 lb 3.9 oz)    SpO2: 100% 100% 100% 100%   Discharge Instructions  Discharge Orders    Future Orders Please Complete By Expires   Diet - low sodium heart healthy      Increase activity slowly      Discharge instructions      Comments:   Please followup with PCP in 1 week.  Please followup with psychiatry in 2-3 weeks after discharge.       Medication List     As of 10/17/2012 12:04 PM    STOP taking these medications         olmesartan-hydrochlorothiazide 20-12.5 MG per tablet   Commonly known as: BENICAR HCT      TAKE these medications  ALPRAZolam 0.25 MG tablet   Commonly known as: XANAX   Take 0.25 mg by mouth daily as needed. As directed by physician      celecoxib 200 MG capsule   Commonly known as: CELEBREX   Take 200 mg by mouth 2 (two) times daily.      cyclobenzaprine 10 MG tablet   Commonly known as: FLEXERIL   Take 10 mg by mouth 3 (three) times daily as needed. Muscle spasm      dicyclomine 10 MG capsule   Commonly known as: BENTYL   Take 10 mg by mouth  3 (three) times daily as needed. For cramping and stomach pain      etodolac 400 MG tablet   Commonly known as: LODINE   Take 400 mg by mouth 2 (two) times daily as needed. For pain or inflammation      hyoscyamine 0.125 MG tablet   Commonly known as: LEVSIN, ANASPAZ   Take 0.25 mg by mouth every 4 (four) hours as needed. For stomach cramping      lisinopril-hydrochlorothiazide 20-12.5 MG per tablet   Commonly known as: PRINZIDE,ZESTORETIC   Take 1 tablet by mouth daily.      lubiprostone 8 MCG capsule   Commonly known as: AMITIZA   Take 8 mcg by mouth 3 (three) times daily.      methocarbamol 500 MG tablet   Commonly known as: ROBAXIN   Take 500 mg by mouth every 8 (eight) hours as needed. For muscle spasm      nabumetone 750 MG tablet   Commonly known as: RELAFEN   Take 750 mg by mouth 2 (two) times daily as needed. For inflammation      sertraline 50 MG tablet   Commonly known as: ZOLOFT   Take 1 tablet (50 mg total) by mouth daily.      traMADol 50 MG tablet   Commonly known as: ULTRAM   Take 50 mg by mouth every 6 (six) hours as needed. Pain      zolpidem 10 MG tablet   Commonly known as: AMBIEN   Take 10 mg by mouth at bedtime as needed. For sleep            Follow-up Information    Follow up with Pcp Not In System. Schedule an appointment as soon as possible for a visit in 1 week.          The results of significant diagnostics from this hospitalization (including imaging, microbiology, ancillary and laboratory) are listed below for reference.    Significant Diagnostic Studies: No results found.  Microbiology: No results found for this or any previous visit (from the past 240 hour(s)).   Labs: Basic Metabolic Panel:  Lab 10/16/12 1191 10/15/12 1946  NA 135 135  K 3.3* 3.1*  CL 100 95*  CO2 25 26  GLUCOSE 94 93  BUN 9 15  CREATININE 0.54 0.62  CALCIUM 8.6 9.9  MG -- --  PHOS -- --   Liver Function Tests:  Lab 10/15/12 1946  AST 12   ALT 12  ALKPHOS 85  BILITOT 0.4  PROT 8.5*  ALBUMIN 4.3   No results found for this basename: LIPASE:5,AMYLASE:5 in the last 168 hours No results found for this basename: AMMONIA:5 in the last 168 hours CBC:  Lab 10/16/12 1111 10/15/12 1946  WBC 7.3 6.8  NEUTROABS -- --  HGB 11.6* 14.5  HCT 36.2 45.2  MCV 66.7* 66.6*  PLT 203 243  Cardiac Enzymes: No results found for this basename: CKTOTAL:5,CKMB:5,CKMBINDEX:5,TROPONINI:5 in the last 168 hours BNP: BNP (last 3 results) No results found for this basename: PROBNP:3 in the last 8760 hours CBG: No results found for this basename: GLUCAP:5 in the last 168 hours     Signed:  Ilia Dimaano A  [facility] 10/17/2012, 11:57 AM

## 2012-10-17 NOTE — Progress Notes (Signed)
Patient instructed as to [facility] Chart, declined to access before discharge, states has computer at home and she will access later.  Explained the process to the patient.

## 2012-10-17 NOTE — Progress Notes (Signed)
[facility] PROGRESS NOTE  MARAKI MACQUARRIE ZOX:[id number] DOB: 1966-05-03 DOA: 10/15/2012 PCP: No primary provider on file.  Assessment/Plan: Overdose - attempted suicide on home medications Patient is not a reliable historian but at this time indicated on 10/16/2012 that she took 6 tabs of her flexeril, 4 tabs of etodolac, 6 tabs of lisinopril/HCTZ, 4 tabs of robaxin, 4 tabs of nabumetone, and 4 tabs of Ambien yesterday at 5 PM. Discussed with poison control on 10/16/2012 was stable for discharge from a medical stand point. Psychiatry consulted, disposition pending from psychiatry. Corporate investment banker. No events on telemetry (QTC 0.43 ms this morning). Patient is appropriate during my conversation with the patient at this time. Per psychiatry started the patient on zoloft.  Hypertension Stable, restart today medications tomorrow.  Anxiety Start xanax today. Stable.  History of irritable bowel syndrome Start PRN dicyclomine and hyoscyamine today.  Insomnia Start Ambien today as needed for sleep.  GERD Stable.  Degenerative arthritis of knee Stable, restart PRN cyclobenzaprine, methocarbamol, and nabumetone today.  Hypokalemia Replace as needed.  Code Status: Full code Family Communication: No family at bedside. Patient updated.  Disposition Plan: Medically stable for discharge.  Consultants:  Psychiatry Dr. Ferol Luz and Dr. Theotis Barrio.  Procedures:  None  Antibiotics:  None.  HPI/Subjective: No specific concerns. Denies any suicidal thoughts or ideation at this time. Eager to go home.  Objective: Filed Vitals:   10/15/12 2345 10/16/12 0512 10/16/12 2132 10/17/12 0636  BP: 139/80 114/72 122/76 127/80  Pulse: 120 100 118 97  Temp: 98.1 F (36.7 C) 97.6 F (36.4 C) 98.1 F (36.7 C) 97.8 F (36.6 C)  TempSrc: Oral Oral Oral Oral  Resp: 20 16 20 18   Weight:  74.5 kg (164 lb 3.9 oz)    SpO2: 100% 100% 100% 100%    Intake/Output Summary (Last 24 hours) at  10/17/12 1143 Last data filed at 10/17/12 8119  Gross per 24 hour  Intake    480 ml  Output      1 ml  Net    479 ml   Filed Weights   10/16/12 0512  Weight: 74.5 kg (164 lb 3.9 oz)    Exam: Physical Exam: General: Awake, Oriented, No acute distress. HEENT: EOMI. Neck: Supple CV: S1 and S2 Lungs: Clear to ascultation bilaterally Abdomen: Soft, Nontender, Nondistended, +bowel sounds. Ext: Good pulses. Trace edema.  Data Reviewed: Basic Metabolic Panel:  Lab 10/16/12 1478 10/15/12 1946  NA 135 135  K 3.3* 3.1*  CL 100 95*  CO2 25 26  GLUCOSE 94 93  BUN 9 15  CREATININE 0.54 0.62  CALCIUM 8.6 9.9  MG -- --  PHOS -- --   Liver Function Tests:  Lab 10/15/12 1946  AST 12  ALT 12  ALKPHOS 85  BILITOT 0.4  PROT 8.5*  ALBUMIN 4.3   No results found for this basename: LIPASE:5,AMYLASE:5 in the last 168 hours No results found for this basename: AMMONIA:5 in the last 168 hours CBC:  Lab 10/16/12 1111 10/15/12 1946  WBC 7.3 6.8  NEUTROABS -- --  HGB 11.6* 14.5  HCT 36.2 45.2  MCV 66.7* 66.6*  PLT 203 243   Cardiac Enzymes: No results found for this basename: CKTOTAL:5,CKMB:5,CKMBINDEX:5,TROPONINI:5 in the last 168 hours BNP (last 3 results) No results found for this basename: PROBNP:3 in the last 8760 hours CBG: No results found for this basename: GLUCAP:5 in the last 168 hours  No results found for this or any previous visit (from the past  240 hour(s)).   Studies: No results found.  Scheduled Meds:    . celecoxib  200 mg Oral BID  . lisinopril  20 mg Oral Daily   And  . hydrochlorothiazide  12.5 mg Oral Daily  . lubiprostone  8 mcg Oral TID  . potassium chloride  40 mEq Oral Q4H  . sodium chloride  3 mL Intravenous Q12H   Continuous Infusions:    . sodium chloride 75 mL/hr at 10/16/12 1613    Principal Problem:  *Overdose Active Problems:  DYSPEPSIA&OTHER SPEC DISORDERS FUNCTION STOMACH  GERD  Irritable bowel syndrome  Degenerative  arthritis of knee  Hypertension  Time spent: 20 mins  Keiko Myricks A  [facility] Pager [phone]. If 7PM-7AM, please contact night-coverage at [email or web address], password [facility] 10/17/2012, 11:43 AM  LOS: 2 days

## 2012-10-28 ENCOUNTER — Other Ambulatory Visit (HOSPITAL_COMMUNITY): Payer: Self-pay | Admitting: Orthopaedic Surgery

## 2012-10-28 DIAGNOSIS — M25561 Pain in right knee: Secondary | ICD-10-CM

## 2012-11-03 ENCOUNTER — Encounter (HOSPITAL_COMMUNITY)
Admission: RE | Admit: 2012-11-03 | Discharge: 2012-11-03 | Disposition: A | Discharge status: Home / Self Care | Payer: Medicaid Other | Source: Ambulatory Visit | Attending: Orthopaedic Surgery | Admitting: Orthopaedic Surgery

## 2012-11-03 DIAGNOSIS — M25561 Pain in right knee: Secondary | ICD-10-CM

## 2012-11-03 DIAGNOSIS — Z96659 Presence of unspecified artificial knee joint: Secondary | ICD-10-CM | POA: Insufficient documentation

## 2012-11-03 DIAGNOSIS — M25469 Effusion, unspecified knee: Secondary | ICD-10-CM | POA: Insufficient documentation

## 2012-11-03 DIAGNOSIS — M25569 Pain in unspecified knee: Secondary | ICD-10-CM | POA: Insufficient documentation

## 2012-11-03 MED ORDER — TECHNETIUM TC 99M MEDRONATE IV KIT
25.3000 | PACK | Freq: Once | INTRAVENOUS | Status: AC | PRN
  Administered 2012-11-03: 25.3 via INTRAVENOUS

## 2012-12-12 ENCOUNTER — Other Ambulatory Visit (HOSPITAL_COMMUNITY): Payer: Self-pay | Admitting: Orthopaedic Surgery

## 2012-12-12 NOTE — Progress Notes (Signed)
Need MD order entry in Epic.

## 2012-12-13 ENCOUNTER — Encounter (HOSPITAL_COMMUNITY): Payer: Self-pay | Admitting: Pharmacy Technician

## 2012-12-14 ENCOUNTER — Encounter (HOSPITAL_COMMUNITY): Payer: Self-pay

## 2012-12-14 ENCOUNTER — Encounter (HOSPITAL_COMMUNITY)
Admission: RE | Admit: 2012-12-14 | Discharge: 2012-12-14 | Disposition: A | Discharge status: Home / Self Care | Payer: Medicaid Other | Source: Ambulatory Visit | Attending: Orthopaedic Surgery | Admitting: Orthopaedic Surgery

## 2012-12-14 HISTORY — DX: Gastro-esophageal reflux disease without esophagitis: K21.9

## 2012-12-14 HISTORY — DX: Unspecified osteoarthritis, unspecified site: M19.90

## 2012-12-14 LAB — CBC
HCT: 36.4 % (ref 36.0–46.0)
Hemoglobin: 11.8 g/dL — ABNORMAL LOW (ref 12.0–15.0)
MCH: 22 pg — ABNORMAL LOW (ref 26.0–34.0)
MCHC: 32.4 g/dL (ref 30.0–36.0)
MCV: 67.9 fL — ABNORMAL LOW (ref 78.0–100.0)
Platelets: 195 10*3/uL (ref 150–400)
RBC: 5.36 MIL/uL — ABNORMAL HIGH (ref 3.87–5.11)
RDW: 14.6 % (ref 11.5–15.5)
WBC: 3.3 10*3/uL — ABNORMAL LOW (ref 4.0–10.5)

## 2012-12-14 LAB — SURGICAL PCR SCREEN
MRSA, PCR: NEGATIVE
Staphylococcus aureus: NEGATIVE

## 2012-12-14 LAB — HCG, SERUM, QUALITATIVE: Preg, Serum: NEGATIVE

## 2012-12-14 LAB — SEDIMENTATION RATE: Sed Rate: 4 mm/hr (ref 0–22)

## 2012-12-14 LAB — C-REACTIVE PROTEIN: CRP: <0.5 mg/dL — ABNORMAL LOW (ref <0.60)

## 2012-12-14 NOTE — Progress Notes (Signed)
Abnormal CRP faxed to Dr. Maureen Ralphs - confirmation recieved

## 2012-12-14 NOTE — Patient Instructions (Signed)
Deanna Rodgers  12/14/2012                           YOUR PROCEDURE IS SCHEDULED ON: 12/23/12               PLEASE REPORT TO SHORT STAY CENTER AT :  1:00               CALL THIS NUMBER IF ANY PROBLEMS THE DAY OF SURGERY :               [phone]                      REMEMBER:   Do not eat food or drink liquids AFTER MIDNIGHT  May have clear liquids UNTIL 6 HOURS BEFORE SURGERY (9:30 AM)  Clear liquids include soda, tea, black coffee, apple or grape juice, broth.  Take these medicines the morning of surgery with A SIP OF WATER: HYDROCODONE IF NEEDED   Do not wear jewelry, make-up   Do not wear lotions, powders, or perfumes.   Do not shave legs or underarms 12 hrs. before surgery (men may shave face)  Do not bring valuables to the hospital.  Contacts, dentures or bridgework may not be worn into surgery.  Leave suitcase in the car. After surgery it may be brought to your room.  For patients admitted to the hospital more than one night, checkout time is 11:00                          The day of discharge.   Patients discharged the day of surgery will not be allowed to drive home                             If going home same day of surgery, must have someone stay with you first                           24 hrs at home and arrange for some one to drive you home from hospital.    Special Instructions:   Please read over the following fact sheets that you were given:               1. MRSA  INFORMATION                      2.  PREPARING FOR SURGERY SHEET                                                X_____________________________________________________________________        Failure to follow these instructions may result in cancellation of your surgery

## 2012-12-23 ENCOUNTER — Encounter (HOSPITAL_COMMUNITY): Payer: Self-pay | Admitting: Anesthesiology

## 2012-12-23 ENCOUNTER — Ambulatory Visit (HOSPITAL_COMMUNITY)
Admission: RE | Admit: 2012-12-23 | Discharge: 2012-12-24 | Disposition: A | Discharge status: Home / Self Care | Payer: Medicaid Other | Source: Ambulatory Visit | Attending: Orthopaedic Surgery | Admitting: Orthopaedic Surgery

## 2012-12-23 ENCOUNTER — Encounter (HOSPITAL_COMMUNITY)
Admission: RE | Disposition: A | Discharge status: Home / Self Care | Payer: Self-pay | Source: Ambulatory Visit | Attending: Orthopaedic Surgery

## 2012-12-23 ENCOUNTER — Encounter (HOSPITAL_COMMUNITY): Payer: Self-pay | Admitting: *Deleted

## 2012-12-23 ENCOUNTER — Ambulatory Visit (HOSPITAL_COMMUNITY): Payer: Medicaid Other | Admitting: Anesthesiology

## 2012-12-23 DIAGNOSIS — Z01812 Encounter for preprocedural laboratory examination: Secondary | ICD-10-CM | POA: Insufficient documentation

## 2012-12-23 DIAGNOSIS — K219 Gastro-esophageal reflux disease without esophagitis: Secondary | ICD-10-CM | POA: Insufficient documentation

## 2012-12-23 DIAGNOSIS — M25569 Pain in unspecified knee: Secondary | ICD-10-CM | POA: Insufficient documentation

## 2012-12-23 DIAGNOSIS — Y831 Surgical operation with implant of artificial internal device as the cause of abnormal reaction of the patient, or of later complication, without mention of misadventure at the time of the procedure: Secondary | ICD-10-CM | POA: Insufficient documentation

## 2012-12-23 DIAGNOSIS — M659 Unspecified synovitis and tenosynovitis, unspecified site: Secondary | ICD-10-CM | POA: Insufficient documentation

## 2012-12-23 DIAGNOSIS — T8489XA Other specified complication of internal orthopedic prosthetic devices, implants and grafts, initial encounter: Secondary | ICD-10-CM | POA: Insufficient documentation

## 2012-12-23 DIAGNOSIS — Z96659 Presence of unspecified artificial knee joint: Secondary | ICD-10-CM | POA: Insufficient documentation

## 2012-12-23 HISTORY — PX: KNEE ARTHROSCOPY: SHX127

## 2012-12-23 SURGERY — ARTHROSCOPY, KNEE
Anesthesia: General | Site: Knee | Laterality: Right | Wound class: Clean

## 2012-12-23 MED ORDER — ZOLPIDEM TARTRATE 5 MG PO TABS: 5.0000 mg | ORAL_TABLET | Freq: Every evening | ORAL | Status: DC | PRN

## 2012-12-23 MED ORDER — CEFAZOLIN SODIUM 1-5 GM-% IV SOLN
1.0000 g | Freq: Four times a day (QID) | INTRAVENOUS | Status: AC
  Administered 2012-12-23 – 2012-12-24 (×3): 1 g via INTRAVENOUS
  Filled 2012-12-23 (×3): qty 50

## 2012-12-23 MED ORDER — MORPHINE SULFATE 4 MG/ML IJ SOLN
INTRAMUSCULAR | Status: AC
  Filled 2012-12-23: qty 1

## 2012-12-23 MED ORDER — HYDROCODONE-ACETAMINOPHEN 5-325 MG PO TABS: 1.0000 | ORAL_TABLET | ORAL | Status: DC | PRN

## 2012-12-23 MED ORDER — ACETAMINOPHEN 10 MG/ML IV SOLN
INTRAVENOUS | Status: AC
  Filled 2012-12-23: qty 100

## 2012-12-23 MED ORDER — CEFAZOLIN SODIUM-DEXTROSE 2-3 GM-% IV SOLR
INTRAVENOUS | Status: AC
  Filled 2012-12-23: qty 50

## 2012-12-23 MED ORDER — CEFAZOLIN SODIUM-DEXTROSE 2-3 GM-% IV SOLR
2.0000 g | Freq: Once | INTRAVENOUS | Status: AC
  Administered 2012-12-23: 2 g via INTRAVENOUS

## 2012-12-23 MED ORDER — METOCLOPRAMIDE HCL 10 MG PO TABS: 5.0000 mg | ORAL_TABLET | Freq: Three times a day (TID) | ORAL | Status: DC | PRN

## 2012-12-23 MED ORDER — LACTATED RINGERS IV SOLN
INTRAVENOUS | Status: DC
  Administered 2012-12-23: 16:00:00 via INTRAVENOUS
  Administered 2012-12-23: 1000 mL via INTRAVENOUS

## 2012-12-23 MED ORDER — METHOCARBAMOL 500 MG PO TABS
500.0000 mg | ORAL_TABLET | Freq: Four times a day (QID) | ORAL | Status: DC | PRN
  Administered 2012-12-24 (×2): 500 mg via ORAL
  Filled 2012-12-23 (×2): qty 1

## 2012-12-23 MED ORDER — DIPHENHYDRAMINE HCL 12.5 MG/5ML PO ELIX: 12.5000 mg | ORAL_SOLUTION | ORAL | Status: DC | PRN

## 2012-12-23 MED ORDER — HYDROMORPHONE HCL PF 1 MG/ML IJ SOLN: 0.2500 mg | INTRAMUSCULAR | Status: DC | PRN

## 2012-12-23 MED ORDER — PROPOFOL 10 MG/ML IV BOLUS
INTRAVENOUS | Status: DC | PRN
  Administered 2012-12-23: 200 mg via INTRAVENOUS

## 2012-12-23 MED ORDER — DEXTROSE 5 % IV SOLN
500.0000 mg | Freq: Four times a day (QID) | INTRAVENOUS | Status: DC | PRN
  Filled 2012-12-23: qty 5

## 2012-12-23 MED ORDER — BUPIVACAINE HCL (PF) 0.5 % IJ SOLN
INTRAMUSCULAR | Status: DC | PRN
  Administered 2012-12-23: 20 mL

## 2012-12-23 MED ORDER — MORPHINE SULFATE 2 MG/ML IJ SOLN: 1.0000 mg | INTRAMUSCULAR | Status: DC | PRN

## 2012-12-23 MED ORDER — SODIUM CHLORIDE 0.9 % IV SOLN
INTRAVENOUS | Status: DC
  Administered 2012-12-23: 20 mL/h via INTRAVENOUS

## 2012-12-23 MED ORDER — ONDANSETRON HCL 4 MG/2ML IJ SOLN
INTRAMUSCULAR | Status: DC | PRN
  Administered 2012-12-23: 4 mg via INTRAVENOUS

## 2012-12-23 MED ORDER — ONDANSETRON HCL 4 MG/2ML IJ SOLN: 4.0000 mg | Freq: Four times a day (QID) | INTRAMUSCULAR | Status: DC | PRN

## 2012-12-23 MED ORDER — OXYCODONE HCL 5 MG PO TABS
5.0000 mg | ORAL_TABLET | ORAL | Status: DC | PRN
  Administered 2012-12-23: 10 mg via ORAL
  Administered 2012-12-23: 5 mg via ORAL
  Administered 2012-12-24 (×4): 10 mg via ORAL
  Filled 2012-12-23 (×2): qty 2
  Filled 2012-12-23: qty 1
  Filled 2012-12-23 (×3): qty 2

## 2012-12-23 MED ORDER — LACTATED RINGERS IV SOLN: INTRAVENOUS | Status: DC

## 2012-12-23 MED ORDER — ACETAMINOPHEN 10 MG/ML IV SOLN
INTRAVENOUS | Status: DC | PRN
  Administered 2012-12-23: 1000 mg via INTRAVENOUS

## 2012-12-23 MED ORDER — BUPIVACAINE HCL (PF) 0.5 % IJ SOLN
INTRAMUSCULAR | Status: AC
  Filled 2012-12-23: qty 30

## 2012-12-23 MED ORDER — MIDAZOLAM HCL 5 MG/5ML IJ SOLN
INTRAMUSCULAR | Status: DC | PRN
  Administered 2012-12-23: 2 mg via INTRAVENOUS

## 2012-12-23 MED ORDER — METOCLOPRAMIDE HCL 5 MG/ML IJ SOLN
INTRAMUSCULAR | Status: DC | PRN
  Administered 2012-12-23: 10 mg via INTRAVENOUS

## 2012-12-23 MED ORDER — METOCLOPRAMIDE HCL 5 MG/ML IJ SOLN: 5.0000 mg | Freq: Three times a day (TID) | INTRAMUSCULAR | Status: DC | PRN

## 2012-12-23 MED ORDER — ONDANSETRON HCL 4 MG PO TABS: 4.0000 mg | ORAL_TABLET | Freq: Four times a day (QID) | ORAL | Status: DC | PRN

## 2012-12-23 MED ORDER — PROMETHAZINE HCL 25 MG/ML IJ SOLN: 6.2500 mg | INTRAMUSCULAR | Status: DC | PRN

## 2012-12-23 MED ORDER — FENTANYL CITRATE 0.05 MG/ML IJ SOLN
INTRAMUSCULAR | Status: DC | PRN
  Administered 2012-12-23: 100 ug via INTRAVENOUS
  Administered 2012-12-23 (×2): 50 ug via INTRAVENOUS

## 2012-12-23 MED ORDER — LIDOCAINE HCL (CARDIAC) 20 MG/ML IV SOLN
INTRAVENOUS | Status: DC | PRN
  Administered 2012-12-23: 75 mg via INTRAVENOUS

## 2012-12-23 MED ORDER — OXYCODONE-ACETAMINOPHEN 5-325 MG PO TABS: 1.0000 | ORAL_TABLET | ORAL | Status: DC | PRN

## 2012-12-23 SURGICAL SUPPLY — 29 items
BLADE CUDA SHAVER 3.5 (BLADE) ×2 IMPLANT
CLOTH BEACON ORANGE TIMEOUT ST (SAFETY) ×2 IMPLANT
COUNTER NEEDLE 20 DBL MAG RED (NEEDLE) IMPLANT
DRAPE U-SHAPE 47X51 STRL (DRAPES) ×2 IMPLANT
DRSG PAD ABDOMINAL 8X10 ST (GAUZE/BANDAGES/DRESSINGS) ×2 IMPLANT
DRSG XEROFORM 1X8 (GAUZE/BANDAGES/DRESSINGS) ×2 IMPLANT
DURAPREP 26ML APPLICATOR (WOUND CARE) ×2 IMPLANT
GAUZE XEROFORM 1X8 LF (GAUZE/BANDAGES/DRESSINGS) ×2 IMPLANT
GLOVE BIO SURGEON STRL SZ7.5 (GLOVE) ×2 IMPLANT
GLOVE BIOGEL PI IND STRL 8 (GLOVE) ×1 IMPLANT
GLOVE BIOGEL PI INDICATOR 8 (GLOVE) ×1
GOWN STRL REIN XL XLG (GOWN DISPOSABLE) ×2 IMPLANT
IV LACTATED RINGER IRRG 3000ML (IV SOLUTION) ×6
IV LR IRRIG 3000ML ARTHROMATIC (IV SOLUTION) ×3 IMPLANT
MANIFOLD NEPTUNE II (INSTRUMENTS) ×2 IMPLANT
PACK ARTHROSCOPY WL (CUSTOM PROCEDURE TRAY) ×2 IMPLANT
PADDING CAST COTTON 6X4 STRL (CAST SUPPLIES) ×2 IMPLANT
POSITIONER SURGICAL ARM (MISCELLANEOUS) ×2 IMPLANT
SET ARTHROSCOPY TUBING (MISCELLANEOUS) ×2
SET ARTHROSCOPY TUBING LN (MISCELLANEOUS) ×1 IMPLANT
SPONGE GAUZE 4X4 12PLY (GAUZE/BANDAGES/DRESSINGS) ×2 IMPLANT
SUT DVC VLOC 180 2-0 12IN GS21 (SUTURE)
SUT ETHILON 4 0 PS 2 18 (SUTURE) ×2 IMPLANT
SUTURE DVC VL 180 2-0 12INGS21 (SUTURE) IMPLANT
SYR 20CC LL (SYRINGE) ×2 IMPLANT
TOWEL OR 17X26 10 PK STRL BLUE (TOWEL DISPOSABLE) ×2 IMPLANT
TUBING CONNECTING 10 (TUBING) ×2 IMPLANT
WAND 90 DEG TURBOVAC W/CORD (SURGICAL WAND) IMPLANT
WRAP KNEE MAXI GEL POST OP (GAUZE/BANDAGES/DRESSINGS) ×2 IMPLANT

## 2012-12-23 NOTE — H&P (Signed)
Deanna Rodgers is an 47 y.o. female.   Chief Complaint:   Right knee pain post total knee replacement HPI: 47 yo female with a painful right total knee replacement and decreased motion.  Her x-rays and even a bone scan show excellent alignment and no evidence of component loosening.  She has only had trace effusions, but complains of daily pain.  Past Medical History  Diagnosis Date  . Hypertension     borderline - no meds  . Arthritis   . GERD (gastroesophageal reflux disease)     zantac as needed    Past Surgical History  Procedure Laterality Date  . Cesarean section  12-24-11    '85- x1  . Knee arthroscopy  12-24-11    x2 -[facility]  . Tubal ligation    . Knee arthroplasty  12/25/2011    Procedure: COMPUTER ASSISTED TOTAL KNEE ARTHROPLASTY;  Surgeon: [name], MD;  Location: [facility];  Service: Orthopedics;  Laterality: Right;  . Joint replacement  12/2011    rt total knee    No family history on file. Social History:  reports that she has never smoked. She has never used smokeless tobacco. She reports that  drinks alcohol. She reports that she does not use illicit drugs.  Allergies: No Known Allergies  No prescriptions prior to admission    No results found for this or any previous visit (from the past 48 hour(s)). No results found.  Review of Systems  All other systems reviewed and are negative.    There were no vitals taken for this visit. Physical Exam  Constitutional: She is oriented to person, place, and time. She appears well-developed and well-nourished.  HENT:  Head: Normocephalic and atraumatic.  Eyes: EOM are normal. Pupils are equal, round, and reactive to light.  Neck: Normal range of motion. Neck supple.  Cardiovascular: Normal rate and regular rhythm.   Respiratory: Effort normal and breath sounds normal.  GI: Soft. Bowel sounds are normal.  Musculoskeletal:       Right knee: She exhibits decreased range of motion.  Tenderness found. Medial joint line and lateral joint line tenderness noted.  Neurological: She is alert and oriented to person, place, and time.  Skin: Skin is warm and dry.  Psychiatric: She has a normal mood and affect.     Assessment/Plan Painful right total knee arthroplasty with no evidence of loosening or mal-alignment and suspected synovitis 1) to the OR today for a right knee arthroscopy for debridement of scar tissue and partial synovectomy.  [name] 12/23/2012, 7:31 AM

## 2012-12-23 NOTE — Brief Op Note (Signed)
12/23/2012  4:57 PM  PATIENT:  Deanna Rodgers  47 y.o. female  PRE-OPERATIVE DIAGNOSIS:  Synovitis Right knee  POST-OPERATIVE DIAGNOSIS:  Synovitis Right knee  PROCEDURE:  Procedure(s) with comments: Right Knee Arthroscopy with minimal Debridement  (Right) - Right Knee Arthroscopy with minimal  Debridement   SURGEON:  Surgeon(s) and Role:    * [name], MD - Primary  PHYSICIAN ASSISTANT:   ASSISTANTS: none   ANESTHESIA:   local and general  EBL:  Total I/O In: 1000 [I.V.:1000] Out: -   BLOOD ADMINISTERED:none  DRAINS: none   LOCAL MEDICATIONS USED:  NONE  SPECIMEN:  No Specimen  DISPOSITION OF SPECIMEN:  N/A  COUNTS:  YES  TOURNIQUET:    DICTATION: .Other Dictation: Dictation Number [id number]  PLAN OF CARE: Admit for overnight observation  PATIENT DISPOSITION:  PACU - hemodynamically stable.   Delay start of Pharmacological VTE agent (>24hrs) due to surgical blood loss or risk of bleeding: no

## 2012-12-23 NOTE — Anesthesia Postprocedure Evaluation (Signed)
Anesthesia Post Note  Patient: Deanna Rodgers  Procedure(s) Performed: Procedure(s) (LRB): Right Knee Arthroscopy with minimal Debridement  (Right)  Anesthesia type: General  Patient location: PACU  Post pain: Pain level controlled  Post assessment: Post-op Vital signs reviewed  Last Vitals:  Filed Vitals:   12/23/12 1715  BP: 124/77  Pulse: 101  Temp:   Resp: 14    Post vital signs: Reviewed  Level of consciousness: sedated  Complications: No apparent anesthesia complications

## 2012-12-23 NOTE — Anesthesia Preprocedure Evaluation (Addendum)
Anesthesia Evaluation  Patient identified by MRN, date of birth, ID band Patient awake    Reviewed: Allergy & Precautions, H&P , NPO status , Patient's Chart, lab work & pertinent test results  History of Anesthesia Complications (+) PONV  Airway Mallampati: II TM Distance: >3 FB Neck ROM: Full    Dental no notable dental hx. (+) Poor Dentition, Chipped and Dental Advisory Given,    Pulmonary neg pulmonary ROS,  breath sounds clear to auscultation  Pulmonary exam normal       Cardiovascular hypertension, Pt. on medications + Peripheral Vascular Disease negative cardio ROS  Rhythm:Regular Rate:Normal     Neuro/Psych negative neurological ROS  negative psych ROS   GI/Hepatic negative GI ROS, Neg liver ROS, PUD, GERD-  Medicated and Controlled,  Endo/Other  negative endocrine ROS  Renal/GU negative Renal ROS  negative genitourinary   Musculoskeletal negative musculoskeletal ROS (+)   Abdominal   Peds negative pediatric ROS (+)  Hematology negative hematology ROS (+)   Anesthesia Other Findings Translucent appearance to central incisors with visible post on back of teeth.  Reproductive/Obstetrics negative OB ROS                          Anesthesia Physical Anesthesia Plan  ASA: I  Anesthesia Plan: General   Post-op Pain Management:    Induction: Intravenous  Airway Management Planned: LMA  Additional Equipment:   Intra-op Plan:   Post-operative Plan: Extubation in OR  Informed Consent: I have reviewed the patients History and Physical, chart, labs and discussed the procedure including the risks, benefits and alternatives for the proposed anesthesia with the patient or authorized representative who has indicated his/her understanding and acceptance.   Dental advisory given  Plan Discussed with: CRNA  Anesthesia Plan Comments:        Anesthesia Quick Evaluation

## 2012-12-23 NOTE — Transfer of Care (Signed)
Immediate Anesthesia Transfer of Care Note  Patient: Deanna Rodgers  Procedure(s) Performed: Procedure(s) with comments: Right Knee Arthroscopy with minimal Debridement  (Right) - Right Knee Arthroscopy with minimal  Debridement   Patient Location: PACU  Anesthesia Type:General  Level of Consciousness: awake, alert , oriented and patient cooperative  Airway & Oxygen Therapy: Patient Spontanous Breathing and Patient connected to face mask oxygen  Post-op Assessment: Report given to PACU RN, Post -op Vital signs reviewed and stable and Patient moving all extremities X 4  Post vital signs: stable  Complications: No apparent anesthesia complications

## 2012-12-24 NOTE — Evaluation (Signed)
Physical Therapy Evaluation Patient Details Name: Deanna Rodgers MRN: [id number] DOB: 01-Jan-1966 Today's Date: 12/24/2012 Time: 4098-1191 PT Time Calculation (min): 9 min  PT Assessment / Plan / Recommendation Clinical Impression  Pt s/p right knee arthroscopy with minimal debridement.  Pt ambulated in hallway with RW and practiced stairs to prepare for d/c home today.  Pt doing well with mobility and only reports minimal pain during ambulation and stairs.  Also reviewed ankle pumps, quad sets and heel slides to perform as tolerated at home as pt reports she remembers exercises from TKR.  Pt had no further questions or concerns regarding d/c home today.    PT Assessment  All further PT needs can be met in the next venue of care    Follow Up Recommendations  Home health PT    Does the patient have the potential to tolerate intense rehabilitation      Barriers to Discharge        Equipment Recommendations  Rolling walker with 5" wheels    Recommendations for Other Services     Frequency      Precautions / Restrictions Precautions Precautions: None Restrictions Weight Bearing Restrictions: No   Pertinent Vitals/Pain n/a      Mobility  Bed Mobility Bed Mobility: Supine to Sit;Sit to Supine Supine to Sit: 6: Modified independent (Device/Increase time) Sit to Supine: 6: Modified independent (Device/Increase time) Details for Bed Mobility Assistance: used UEs to assist R LE Transfers Transfers: Sit to Stand;Stand to Sit Sit to Stand: 6: Modified independent (Device/Increase time);From bed Stand to Sit: 6: Modified independent (Device/Increase time);To bed Ambulation/Gait Ambulation/Gait Assistance: 5: Supervision;6: Modified independent (Device/Increase time) Ambulation Distance (Feet): 160 Feet Assistive device: Rolling walker Ambulation/Gait Assistance Details: encouraged knee flexion with swing R LE Gait Pattern: Step-through pattern Gait velocity:  decreased Stairs: Yes Stairs Assistance: 5: Supervision Stairs Assistance Details (indicate cue type and reason): pt able to verbalize correct technique prior to performing and demonstrated well, no assist or unsteadiness, pt performed 3 short steps ascending then 2 taller steps descending Stair Management Technique: Two rails;Step to pattern;Forwards Number of Stairs: 3    Exercises     PT Diagnosis: Acute pain  PT Problem List: Decreased range of motion;Decreased mobility;Decreased knowledge of use of DME;Pain PT Treatment Interventions:     PT Goals    Visit Information  Last PT Received On: 12/24/12 Assistance Needed: +1    Subjective Data  Subjective: I was worried the pain would be as bad as the total knee surgery.   Prior Functioning  Home Living Lives With: Other (Comment) (children) Type of Home: House Home Access: Stairs to enter [employer] of Steps: 5-6 Entrance Stairs-Rails: Can reach both Home Layout: Two level Prior Function Level of Independence: Independent Communication Communication: No difficulties    Cognition  Cognition Overall Cognitive Status: Appears within functional limits for tasks assessed/performed Arousal/Alertness: Awake/alert Orientation Level: Appears intact for tasks assessed Behavior During Session: [facility] for tasks performed    Extremity/Trunk Assessment Right Upper Extremity Assessment RUE ROM/Strength/Tone: [facility] for tasks assessed Left Upper Extremity Assessment LUE ROM/Strength/Tone: WFL for tasks assessed Right Lower Extremity Assessment RLE ROM/Strength/Tone: Deficits;Due to pain RLE ROM/Strength/Tone Deficits: good quad contraction, pt required self assist from UEs for lifting and moving R LE during bed mobility, knee flexion 100* seated limited by pain Left Lower Extremity Assessment LLE ROM/Strength/Tone: WFL for tasks assessed   Balance    End of Session PT - End of Session Activity Tolerance: Patient tolerated  treatment well Patient left: in bed;with call bell/phone within reach Nurse Communication: Mobility status  GP Functional Assessment Tool Used: clinical judgement Functional Limitation: Mobility: Walking and moving around Mobility: Walking and Moving Around Current Status [phone]): 0 percent impaired, limited or restricted Mobility: Walking and Moving Around Goal Status (U0454): 0 percent impaired, limited or restricted Mobility: Walking and Moving Around Discharge Status [phone]): 0 percent impaired, limited or restricted   Deanna Rodgers,Deanna Rodgers 12/24/2012, 1:29 PM Deanna Rodgers, PT, DPT 12/24/2012 Pager: [phone]

## 2012-12-24 NOTE — Care Management (Addendum)
Cm spoke with patient concerning discharge planning. Per pt request HHPT, RW, BSC. [facility] to deliver DME to room prior to discharge upon recieval of MD orders. CM notified RN of need for [facility] orders. Pt to discharge home with family assisting in care. Per pt choice [name] to provide [facility] services. [name] rep Lupita Leash made aware @ [phone].    Roxy Manns Pharrell Ledford,RN,BSN [phone]

## 2012-12-24 NOTE — Op Note (Signed)
Deanna, Rodgers NO.:  [id number]  MEDICAL RECORD NO.:  [id number]  LOCATION:  1603                         FACILITY:  [facility]  PHYSICIAN:  Vanita Panda. 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  PROCEDURE:  Right knee arthroscopy with minimal debridement.  FINDINGS:  Minimal synovitis in her right knee with no effusion and no evidence of prosthetic loosening.  SURGEON:  Vanita Panda. Magnus Ivan, M.D.  ANESTHESIA: 1. General. 2. Local with 0.5% plain Sensorcaine.  BLOOD LOSS:  Minimal.  COMPLICATIONS:  None.  INDICATIONS:  Ms. Andrew is a 47 year old female who a year ago underwent a right total knee arthroplasty.  It was uncomplicated.  She had been on narcotics for a year due to arthritis in her knee, and we took her to the operating room for a total knee arthroplasty.  She is still hurting and been on narcotics.  I am very pleased with the x-rays of her right knee that showed a normally aligned replacement.  I see no evidence on plain film of loosening at all.  She has full range of motion of her knee and there has been no effusion.  She wished to proceed with arthroscopic intervention to see what is going on.  I did obtain a three-phase bone scan that was also negative for any prosthetic loosening.  PROCEDURE DESCRIPTION:  After informed consent was obtained, appropriate right knee was marked.  She was brought to the operating room and placed supine on the operating table.  General anesthesia was then obtained. The right leg was prepped and draped from the thigh down the ankle with DuraPrep sterile drapes and a sterile stockinette.  Time-out was called and she was identified with  correct patient, correct right knee.  With the bed raised, and the lateral leg post was utilized, I flexed right knee outside the table.  I then made an anterolateral arthroscopy portal.  I inserted a cannula into the knee and there was no effusion drained from the knee at all.  I went directly to the medial compartment and placed an anterior medial incision.  Through the incision, I placed an arthroscopic shaver and found just minimal areas of red tissue that was just minimal synovitis that was debrided.  The components themselves looked good in position, I was able to put a hook in the knee and pulled the femur and tibial components and saw no motion in these at all.  Also of note, her range of motion was 0 degrees to 120 when she was asleep. I then removed all instrumentation and allowed fluid to lavage through the knee to several liters.  I then closed the portal sites with interrupted nylon suture.  After draining all the fluid from the knee, I inserted 20 mL of 0.5% plain Marcaine in the portal sites and in the knee.  I  closed the portal sites with interrupted 4-0 nylon suture.  I placed Xeroform well padded sterile dressing.  She is awake and extubated taken to recovery room in stable condition.  All final counts were correct.  There were no complications noted.     Vanita Panda. Magnus Ivan, M.D.     CYB/MEDQ  D:  12/23/2012  T:  12/24/2012  Job:  [id number]

## 2012-12-24 NOTE — Progress Notes (Signed)
Patient ID: Deanna Rodgers, female   DOB: 1966/10/10, 47 y.o.   MRN: [id number]

## 2012-12-24 NOTE — Discharge Summary (Signed)
Patient ID: Deanna Rodgers MRN: [id number] DOB/AGE: [birth year] 47 y.o.  Admit date: 12/23/2012 Discharge date: 12/24/2012  Admission Diagnoses:  Principal Problem:   Synovitis of knee   Discharge Diagnoses:  Same  Past Medical History  Diagnosis Date  . Hypertension     borderline - no meds  . Arthritis   . GERD (gastroesophageal reflux disease)     zantac as needed    Surgeries: Procedure(s): Right Knee Arthroscopy with minimal Debridement  on 12/23/2012   Consultants:    Discharged Condition: Improved  Hospital Course: JAQUITTA DUPRIEST is an 47 y.o. female who was admitted 12/23/2012 for operative treatment ofSynovitis of knee. Patient has severe unremitting pain that affects sleep, daily activities, and work/hobbies. After pre-op clearance the patient was taken to the operating room on 12/23/2012 and underwent  Procedure(s): Right Knee Arthroscopy with minimal Debridement .    Patient was given perioperative antibiotics: Anti-infectives   Start     Dose/Rate Route Frequency Ordered Stop   12/23/12 2200  ceFAZolin (ANCEF) IVPB 1 g/50 mL premix     1 g 100 mL/hr over 30 Minutes Intravenous Every 6 hours 12/23/12 1753 12/24/12 1035   12/23/12 1530  ceFAZolin (ANCEF) IVPB 2 g/50 mL premix     2 g 100 mL/hr over 30 Minutes Intravenous  Once 12/23/12 1520 12/23/12 1612       Patient was given sequential compression devices, early ambulation, and chemoprophylaxis to prevent DVT.  Patient benefited maximally from hospital stay and there were no complications.    Recent vital signs: Patient Vitals for the past 24 hrs:  BP Temp Temp src Pulse Resp SpO2 Height Weight  12/24/12 0519 118/83 mmHg 98.3 F (36.8 C) - 96 16 100 % - -  12/24/12 0116 133/91 mmHg 98.3 F (36.8 C) - 91 16 98 % - -  12/23/12 2027 122/85 mmHg 98.2 F (36.8 C) - 80 16 99 % - -  12/23/12 1900 138/83 mmHg 98.3 F (36.8 C) Oral 95 16 100 % - -  12/23/12 1748 144/96 mmHg 98.3 F (36.8 C) Oral 96 14  100 % 5\' 3"  (1.6 m) 75.297 kg (166 lb)  12/23/12 1730 123/76 mmHg 98.2 F (36.8 C) - 96 14 100 % - -  12/23/12 1715 124/77 mmHg - - 101 14 100 % - -  12/23/12 1705 131/83 mmHg 98.4 F (36.9 C) - 103 16 100 % - -     Recent laboratory studies: No results found for this basename: WBC, HGB, HCT, PLT, NA, K, CL, CO2, BUN, CREATININE, GLUCOSE, PT, INR, CALCIUM, 2,  in the last 72 hours   Discharge Medications:     Medication List    STOP taking these medications       HYDROcodone-acetaminophen 7.5-325 MG per tablet  Commonly known as:  NORCO      TAKE these medications       multivitamin with minerals Tabs  Take 1 tablet by mouth daily.     oxyCODONE-acetaminophen 5-325 MG per tablet  Commonly known as:  ROXICET  Take 1-2 tablets by mouth every 4 (four) hours as needed for pain.        Diagnostic Studies: No results found.  Disposition: 01-Home or Self Care      Discharge Orders   Future Orders Complete By Expires     Call MD / Call 911  As directed     Comments:      If you experience chest pain or  shortness of breath, CALL 911 and be transported to the hospital emergency room.  If you develope a fever above 101 F, pus (white drainage) or increased drainage or redness at the wound, or calf pain, call your surgeon's office.    Constipation Prevention  As directed     Comments:      Drink plenty of fluids.  Prune juice may be helpful.  You may use a stool softener, such as Colace (over the counter) 100 mg twice a day.  Use MiraLax (over the counter) for constipation as needed.    Diet - low sodium heart healthy  As directed     Discharge instructions  As directed     Comments:      You can remove your dressings at any time and shower, get your incisions wet and place daily band-aids over your incisions.    Increase activity slowly as tolerated  As directed        Follow-up Information   Follow up with [name], MD.   Contact information:   [address] [place] [address] [phone]        Signed: [name] 12/24/2012, 2:07 PM

## 2012-12-26 ENCOUNTER — Encounter (HOSPITAL_COMMUNITY): Payer: Self-pay | Admitting: Orthopaedic Surgery

## 2013-03-15 ENCOUNTER — Other Ambulatory Visit (HOSPITAL_COMMUNITY): Payer: Self-pay | Admitting: Orthopaedic Surgery

## 2013-03-15 DIAGNOSIS — M25561 Pain in right knee: Secondary | ICD-10-CM

## 2013-03-20 ENCOUNTER — Encounter (HOSPITAL_COMMUNITY)
Admission: RE | Admit: 2013-03-20 | Discharge: 2013-03-20 | Disposition: A | Discharge status: Home / Self Care | Payer: Medicaid Other | Source: Ambulatory Visit | Attending: Orthopaedic Surgery | Admitting: Orthopaedic Surgery

## 2013-03-20 DIAGNOSIS — M25569 Pain in unspecified knee: Secondary | ICD-10-CM | POA: Insufficient documentation

## 2013-03-20 DIAGNOSIS — M25561 Pain in right knee: Secondary | ICD-10-CM

## 2013-03-20 MED ORDER — TECHNETIUM TC 99M MEDRONATE IV KIT
25.0000 | PACK | Freq: Once | INTRAVENOUS | Status: AC | PRN
  Administered 2013-03-20: 25 via INTRAVENOUS

## 2013-03-23 ENCOUNTER — Other Ambulatory Visit (HOSPITAL_COMMUNITY): Payer: Self-pay | Admitting: Orthopaedic Surgery

## 2013-03-31 ENCOUNTER — Encounter (HOSPITAL_COMMUNITY): Payer: Self-pay | Admitting: Pharmacy Technician

## 2013-04-05 NOTE — Patient Instructions (Addendum)
20 [name]  04/05/2013   Your procedure is scheduled on:  04/14/13  FRIDAY  Report to [facility] at 1:10PM       Call this number if you have problems the morning of surgery: [phone]       Remember:   Do not eat food  After Midnight. Thursday NIGHT.  MAY HAVE CLEAR LIQUIDS  Friday MORNING UNTIL 0930, THEN NOTHING BY MOUTH   Take these medicines the morning of surgery with A SIP OF WATER:sertraline   .  Contacts, dentures or partial plates can not be worn to surgery  Leave suitcase in the car. After surgery it may be brought to your room.  For patients admitted to the hospital, checkout time is 11:00 AM day of  discharge.             SPECIAL INSTRUCTIONS- SEE [place] PREPARING FOR SURGERY INSTRUCTION SHEET-     DO NOT WEAR JEWELRY, LOTIONS, POWDERS, OR PERFUMES.  WOMEN-- DO NOT SHAVE LEGS OR UNDERARMS FOR 12 HOURS BEFORE SHOWERS. MEN MAY SHAVE FACE.  Patients discharged the day of surgery will not be allowed to drive home. IF going home the day of surgery, you must have a driver and someone to stay with you for the first 24 hours  Name and phone number of your driver:    admission                                                                    Please read over the following fact sheets that you were given: MRSA Information,, Blood Transfusion Sheet  Information                                                                                    Jasmine December RN  PST [phone]  [phone]                 FAILURE TO FOLLOW THESE INSTRUCTIONS MAY RESULT IN  CANCELLATION   OF YOUR SURGERY                                                  Patient Signature _____________________________

## 2013-04-05 NOTE — Progress Notes (Signed)
EKG 12/13, OV DR Avuere 3/14 with i view chest notation on chart

## 2013-04-06 ENCOUNTER — Encounter (HOSPITAL_COMMUNITY)
Admission: RE | Admit: 2013-04-06 | Discharge: 2013-04-06 | Disposition: A | Discharge status: Home / Self Care | Payer: Medicaid Other | Source: Ambulatory Visit | Attending: Orthopaedic Surgery | Admitting: Orthopaedic Surgery

## 2013-04-06 ENCOUNTER — Encounter (HOSPITAL_COMMUNITY): Payer: Self-pay

## 2013-04-06 HISTORY — DX: Anemia, unspecified: D64.9

## 2013-04-06 HISTORY — DX: Depression, unspecified: F32.A

## 2013-04-06 HISTORY — DX: Major depressive disorder, single episode, unspecified: F32.9

## 2013-04-06 HISTORY — DX: Personal history of other diseases of the digestive system: Z87.19

## 2013-04-06 LAB — URINALYSIS, ROUTINE W REFLEX MICROSCOPIC
Glucose, UA: NEGATIVE mg/dL
Ketones, ur: 15 mg/dL — AB
Nitrite: POSITIVE — AB
Protein, ur: >300 mg/dL — AB
Specific Gravity, Urine: 1.031 — ABNORMAL HIGH (ref 1.005–1.030)
Urobilinogen, UA: 1 mg/dL (ref 0.0–1.0)
pH: 6 (ref 5.0–8.0)

## 2013-04-06 LAB — PROTIME-INR
INR: 1.08 (ref 0.00–1.49)
Prothrombin Time: 13.9 seconds (ref 11.6–15.2)

## 2013-04-06 LAB — ABO/RH: ABO/RH(D): A POS

## 2013-04-06 LAB — BASIC METABOLIC PANEL
BUN: 16 mg/dL (ref 6–23)
CO2: 29 mEq/L (ref 19–32)
Calcium: 9.2 mg/dL (ref 8.4–10.5)
Chloride: 102 mEq/L (ref 96–112)
Creatinine, Ser: 0.61 mg/dL (ref 0.50–1.10)
GFR calc Af Amer: >90 mL/min (ref >90)
GFR calc non Af Amer: >90 mL/min (ref >90)
Glucose, Bld: 98 mg/dL (ref 70–99)
Potassium: 3.2 mEq/L — ABNORMAL LOW (ref 3.5–5.1)
Sodium: 138 mEq/L (ref 135–145)

## 2013-04-06 LAB — SURGICAL PCR SCREEN
MRSA, PCR: NEGATIVE
Staphylococcus aureus: NEGATIVE

## 2013-04-06 LAB — URINE MICROSCOPIC-ADD ON

## 2013-04-06 LAB — CBC
HCT: 36 % (ref 36.0–46.0)
Hemoglobin: 11.2 g/dL — ABNORMAL LOW (ref 12.0–15.0)
MCH: 20.9 pg — ABNORMAL LOW (ref 26.0–34.0)
MCHC: 31.1 g/dL (ref 30.0–36.0)
MCV: 67.2 fL — ABNORMAL LOW (ref 78.0–100.0)
Platelets: 177 10*3/uL (ref 150–400)
RBC: 5.36 MIL/uL — ABNORMAL HIGH (ref 3.87–5.11)
RDW: 15.4 % (ref 11.5–15.5)
WBC: 3.6 10*3/uL — ABNORMAL LOW (ref 4.0–10.5)

## 2013-04-06 LAB — APTT: aPTT: 27 seconds (ref 24–37)

## 2013-04-06 LAB — HCG, SERUM, QUALITATIVE: Preg, Serum: NEGATIVE

## 2013-04-06 NOTE — Progress Notes (Signed)
ekg 01-16-2013 and chext xray 1 view dr Concepcion Elk on chart

## 2013-04-06 NOTE — Progress Notes (Signed)
Micro UA results sent to Dr. Magnus Ivan via epic

## 2013-04-06 NOTE — Progress Notes (Signed)
No pre op antibiotic ordered, do you need one per SCIP? If so please enter order in epic.

## 2013-04-14 ENCOUNTER — Inpatient Hospital Stay (HOSPITAL_COMMUNITY)
Admission: RE | Admit: 2013-04-14 | Discharge: 2013-04-18 | DRG: 489 | Disposition: A | Discharge status: Home Health | Payer: Medicaid Other | Source: Ambulatory Visit | Attending: Orthopaedic Surgery | Admitting: Orthopaedic Surgery

## 2013-04-14 ENCOUNTER — Encounter (HOSPITAL_COMMUNITY): Payer: Self-pay | Admitting: Anesthesiology

## 2013-04-14 ENCOUNTER — Encounter (HOSPITAL_COMMUNITY): Payer: Self-pay | Admitting: *Deleted

## 2013-04-14 ENCOUNTER — Inpatient Hospital Stay (HOSPITAL_COMMUNITY): Payer: Medicaid Other | Admitting: Anesthesiology

## 2013-04-14 ENCOUNTER — Encounter (HOSPITAL_COMMUNITY)
Admission: RE | Disposition: A | Discharge status: Home Health | Payer: Self-pay | Source: Ambulatory Visit | Attending: Orthopaedic Surgery

## 2013-04-14 DIAGNOSIS — M659 Unspecified synovitis and tenosynovitis, unspecified site: Secondary | ICD-10-CM | POA: Diagnosis present

## 2013-04-14 DIAGNOSIS — F329 Major depressive disorder, single episode, unspecified: Secondary | ICD-10-CM | POA: Diagnosis present

## 2013-04-14 DIAGNOSIS — F3289 Other specified depressive episodes: Secondary | ICD-10-CM | POA: Diagnosis present

## 2013-04-14 DIAGNOSIS — Z01812 Encounter for preprocedural laboratory examination: Secondary | ICD-10-CM

## 2013-04-14 DIAGNOSIS — Y831 Surgical operation with implant of artificial internal device as the cause of abnormal reaction of the patient, or of later complication, without mention of misadventure at the time of the procedure: Secondary | ICD-10-CM | POA: Diagnosis present

## 2013-04-14 DIAGNOSIS — Z96659 Presence of unspecified artificial knee joint: Secondary | ICD-10-CM

## 2013-04-14 DIAGNOSIS — K219 Gastro-esophageal reflux disease without esophagitis: Secondary | ICD-10-CM | POA: Diagnosis present

## 2013-04-14 DIAGNOSIS — T8484XA Pain due to internal orthopedic prosthetic devices, implants and grafts, initial encounter: Secondary | ICD-10-CM

## 2013-04-14 DIAGNOSIS — T84099A Other mechanical complication of unspecified internal joint prosthesis, initial encounter: Principal | ICD-10-CM | POA: Diagnosis present

## 2013-04-14 DIAGNOSIS — M25469 Effusion, unspecified knee: Secondary | ICD-10-CM | POA: Diagnosis present

## 2013-04-14 HISTORY — PX: TOTAL KNEE REVISION: SHX996

## 2013-04-14 LAB — TYPE AND SCREEN
ABO/RH(D): A POS
Antibody Screen: NEGATIVE

## 2013-04-14 LAB — GRAM STAIN: Gram Stain: NONE SEEN

## 2013-04-14 SURGERY — TOTAL KNEE REVISION
Anesthesia: General | Laterality: Right | Wound class: Contaminated

## 2013-04-14 MED ORDER — METOCLOPRAMIDE HCL 5 MG/ML IJ SOLN
INTRAMUSCULAR | Status: DC | PRN
  Administered 2013-04-14: 10 mg via INTRAVENOUS

## 2013-04-14 MED ORDER — PROMETHAZINE HCL 25 MG/ML IJ SOLN: 6.2500 mg | INTRAMUSCULAR | Status: DC | PRN

## 2013-04-14 MED ORDER — ASPIRIN EC 325 MG PO TBEC
325.0000 mg | DELAYED_RELEASE_TABLET | Freq: Two times a day (BID) | ORAL | Status: DC
  Administered 2013-04-15 – 2013-04-18 (×7): 325 mg via ORAL
  Filled 2013-04-14 (×9): qty 1

## 2013-04-14 MED ORDER — MENTHOL 3 MG MT LOZG
1.0000 | LOZENGE | OROMUCOSAL | Status: DC | PRN
  Administered 2013-04-16: 3 mg via ORAL
  Filled 2013-04-14: qty 9

## 2013-04-14 MED ORDER — BISACODYL 5 MG PO TBEC
5.0000 mg | DELAYED_RELEASE_TABLET | Freq: Every day | ORAL | Status: DC | PRN
  Administered 2013-04-16: 5 mg via ORAL
  Filled 2013-04-14: qty 1

## 2013-04-14 MED ORDER — MIDAZOLAM HCL 5 MG/5ML IJ SOLN
INTRAMUSCULAR | Status: DC | PRN
  Administered 2013-04-14: 2 mg via INTRAVENOUS

## 2013-04-14 MED ORDER — VITAMIN D (ERGOCALCIFEROL) 1.25 MG (50000 UNIT) PO CAPS
50000.0000 [IU] | ORAL_CAPSULE | ORAL | Status: DC
  Administered 2013-04-17: 50000 [IU] via ORAL
  Filled 2013-04-14: qty 1

## 2013-04-14 MED ORDER — LOSARTAN POTASSIUM-HCTZ 50-12.5 MG PO TABS: 1.0000 | ORAL_TABLET | Freq: Every morning | ORAL | Status: DC

## 2013-04-14 MED ORDER — PROPOFOL INFUSION 10 MG/ML OPTIME
INTRAVENOUS | Status: DC | PRN
  Administered 2013-04-14: 140 ug/kg/min via INTRAVENOUS

## 2013-04-14 MED ORDER — PHENOL 1.4 % MT LIQD: 1.0000 | OROMUCOSAL | Status: DC | PRN

## 2013-04-14 MED ORDER — HYDROMORPHONE HCL PF 1 MG/ML IJ SOLN
0.2500 mg | INTRAMUSCULAR | Status: DC | PRN
  Administered 2013-04-14: 0.5 mg via INTRAVENOUS

## 2013-04-14 MED ORDER — ACETAMINOPHEN 650 MG RE SUPP: 650.0000 mg | Freq: Four times a day (QID) | RECTAL | Status: DC | PRN

## 2013-04-14 MED ORDER — MEPERIDINE HCL 50 MG/ML IJ SOLN: 6.2500 mg | INTRAMUSCULAR | Status: DC | PRN

## 2013-04-14 MED ORDER — ALPRAZOLAM 0.25 MG PO TABS: 0.2500 mg | ORAL_TABLET | Freq: Every day | ORAL | Status: DC | PRN

## 2013-04-14 MED ORDER — PHENYLEPHRINE HCL 10 MG/ML IJ SOLN
INTRAMUSCULAR | Status: DC | PRN
  Administered 2013-04-14 (×3): 40 ug via INTRAVENOUS
  Administered 2013-04-14 (×2): 80 ug via INTRAVENOUS
  Administered 2013-04-14: 40 ug via INTRAVENOUS

## 2013-04-14 MED ORDER — HYDROCHLOROTHIAZIDE 12.5 MG PO CAPS
12.5000 mg | ORAL_CAPSULE | Freq: Every day | ORAL | Status: DC
  Administered 2013-04-15 – 2013-04-18 (×4): 12.5 mg via ORAL
  Filled 2013-04-14 (×4): qty 1

## 2013-04-14 MED ORDER — ALUM & MAG HYDROXIDE-SIMETH 200-200-20 MG/5ML PO SUSP
30.0000 mL | ORAL | Status: DC | PRN
  Administered 2013-04-17: 30 mL via ORAL
  Filled 2013-04-14: qty 30

## 2013-04-14 MED ORDER — METHOCARBAMOL 100 MG/ML IJ SOLN
500.0000 mg | Freq: Four times a day (QID) | INTRAVENOUS | Status: DC | PRN
  Administered 2013-04-14: 500 mg via INTRAVENOUS
  Filled 2013-04-14: qty 5

## 2013-04-14 MED ORDER — DEXAMETHASONE SODIUM PHOSPHATE 10 MG/ML IJ SOLN
INTRAMUSCULAR | Status: DC | PRN
  Administered 2013-04-14: 10 mg via INTRAVENOUS

## 2013-04-14 MED ORDER — VANCOMYCIN HCL 1000 MG IV SOLR
1000.0000 mg | INTRAVENOUS | Status: DC | PRN
  Administered 2013-04-14: 1000 mg via INTRAVENOUS

## 2013-04-14 MED ORDER — SODIUM CHLORIDE 0.9 % IR SOLN
Status: DC | PRN
  Administered 2013-04-14: 4000 mL

## 2013-04-14 MED ORDER — ACETAMINOPHEN 325 MG PO TABS
650.0000 mg | ORAL_TABLET | Freq: Four times a day (QID) | ORAL | Status: DC | PRN
  Administered 2013-04-16: 650 mg via ORAL
  Filled 2013-04-14: qty 2

## 2013-04-14 MED ORDER — HYDROMORPHONE HCL PF 1 MG/ML IJ SOLN
1.0000 mg | INTRAMUSCULAR | Status: DC | PRN
  Administered 2013-04-14 – 2013-04-17 (×6): 1 mg via INTRAVENOUS
  Filled 2013-04-14 (×7): qty 1

## 2013-04-14 MED ORDER — POLYETHYLENE GLYCOL 3350 17 G PO PACK
17.0000 g | PACK | Freq: Every day | ORAL | Status: DC | PRN
  Administered 2013-04-15: 17 g via ORAL
  Filled 2013-04-14: qty 1

## 2013-04-14 MED ORDER — ONDANSETRON HCL 4 MG/2ML IJ SOLN: 4.0000 mg | Freq: Four times a day (QID) | INTRAMUSCULAR | Status: DC | PRN

## 2013-04-14 MED ORDER — HYDROMORPHONE HCL PF 1 MG/ML IJ SOLN
INTRAMUSCULAR | Status: DC | PRN
  Administered 2013-04-14 (×2): 1 mg via INTRAVENOUS

## 2013-04-14 MED ORDER — OXYCODONE HCL 5 MG/5ML PO SOLN
5.0000 mg | Freq: Once | ORAL | Status: DC | PRN
  Filled 2013-04-14: qty 5

## 2013-04-14 MED ORDER — OXYCODONE HCL 5 MG PO TABS: 5.0000 mg | ORAL_TABLET | Freq: Once | ORAL | Status: DC | PRN

## 2013-04-14 MED ORDER — CEFAZOLIN SODIUM 1-5 GM-% IV SOLN
1.0000 g | Freq: Four times a day (QID) | INTRAVENOUS | Status: AC
  Administered 2013-04-14 – 2013-04-15 (×2): 1 g via INTRAVENOUS
  Filled 2013-04-14 (×3): qty 50

## 2013-04-14 MED ORDER — SODIUM CHLORIDE 0.9 % IV SOLN
INTRAVENOUS | Status: DC
  Administered 2013-04-14: 19:00:00 via INTRAVENOUS

## 2013-04-14 MED ORDER — LOSARTAN POTASSIUM 50 MG PO TABS
50.0000 mg | ORAL_TABLET | Freq: Every day | ORAL | Status: DC
  Administered 2013-04-15 – 2013-04-18 (×4): 50 mg via ORAL
  Filled 2013-04-14 (×4): qty 1

## 2013-04-14 MED ORDER — PHENYLEPHRINE HCL 10 MG/ML IJ SOLN
10.0000 mg | INTRAVENOUS | Status: DC | PRN
  Administered 2013-04-14: 30 ug/min via INTRAVENOUS

## 2013-04-14 MED ORDER — DOCUSATE SODIUM 100 MG PO CAPS
100.0000 mg | ORAL_CAPSULE | Freq: Two times a day (BID) | ORAL | Status: DC
  Administered 2013-04-14 – 2013-04-18 (×8): 100 mg via ORAL
  Filled 2013-04-14 (×6): qty 1

## 2013-04-14 MED ORDER — METOCLOPRAMIDE HCL 5 MG/ML IJ SOLN: 5.0000 mg | Freq: Three times a day (TID) | INTRAMUSCULAR | Status: DC | PRN

## 2013-04-14 MED ORDER — SERTRALINE HCL 50 MG PO TABS
50.0000 mg | ORAL_TABLET | Freq: Every day | ORAL | Status: DC
  Administered 2013-04-15 – 2013-04-17 (×3): 50 mg via ORAL
  Filled 2013-04-14 (×4): qty 1

## 2013-04-14 MED ORDER — DIPHENHYDRAMINE HCL 12.5 MG/5ML PO ELIX
12.5000 mg | ORAL_SOLUTION | ORAL | Status: DC | PRN
  Administered 2013-04-15: 25 mg via ORAL
  Administered 2013-04-15 – 2013-04-17 (×6): 12.5 mg via ORAL
  Filled 2013-04-14: qty 5
  Filled 2013-04-14 (×2): qty 10
  Filled 2013-04-14 (×5): qty 5

## 2013-04-14 MED ORDER — ACETAMINOPHEN 10 MG/ML IV SOLN: 1000.0000 mg | Freq: Once | INTRAVENOUS | Status: DC | PRN

## 2013-04-14 MED ORDER — 0.9 % SODIUM CHLORIDE (POUR BTL) OPTIME
TOPICAL | Status: DC | PRN
  Administered 2013-04-14: 1000 mL

## 2013-04-14 MED ORDER — ONDANSETRON HCL 4 MG PO TABS: 4.0000 mg | ORAL_TABLET | Freq: Four times a day (QID) | ORAL | Status: DC | PRN

## 2013-04-14 MED ORDER — LACTATED RINGERS IV SOLN
INTRAVENOUS | Status: DC
  Administered 2013-04-14 (×2): via INTRAVENOUS
  Administered 2013-04-14: 1000 mL via INTRAVENOUS

## 2013-04-14 MED ORDER — OXYCODONE HCL ER 20 MG PO T12A
20.0000 mg | EXTENDED_RELEASE_TABLET | Freq: Two times a day (BID) | ORAL | Status: DC
  Administered 2013-04-14 – 2013-04-18 (×8): 20 mg via ORAL
  Filled 2013-04-14 (×8): qty 1

## 2013-04-14 MED ORDER — ZOLPIDEM TARTRATE 5 MG PO TABS: 5.0000 mg | ORAL_TABLET | Freq: Every evening | ORAL | Status: DC | PRN

## 2013-04-14 MED ORDER — OXYCODONE HCL 5 MG PO TABS
5.0000 mg | ORAL_TABLET | ORAL | Status: DC | PRN
  Administered 2013-04-14: 5 mg via ORAL
  Administered 2013-04-15 – 2013-04-18 (×17): 10 mg via ORAL
  Filled 2013-04-14 (×12): qty 2
  Filled 2013-04-14: qty 1
  Filled 2013-04-14 (×5): qty 2

## 2013-04-14 MED ORDER — METOCLOPRAMIDE HCL 10 MG PO TABS: 5.0000 mg | ORAL_TABLET | Freq: Three times a day (TID) | ORAL | Status: DC | PRN

## 2013-04-14 MED ORDER — METHOCARBAMOL 500 MG PO TABS
500.0000 mg | ORAL_TABLET | Freq: Four times a day (QID) | ORAL | Status: DC | PRN
  Administered 2013-04-15 – 2013-04-18 (×7): 500 mg via ORAL
  Filled 2013-04-14 (×7): qty 1

## 2013-04-14 SURGICAL SUPPLY — 55 items
BAG SPEC THK2 15X12 ZIP CLS (MISCELLANEOUS) ×1
BAG ZIPLOCK 12X15 (MISCELLANEOUS) ×2 IMPLANT
BANDAGE ELASTIC 4 VELCRO ST LF (GAUZE/BANDAGES/DRESSINGS) ×2 IMPLANT
BANDAGE ELASTIC 6 VELCRO ST LF (GAUZE/BANDAGES/DRESSINGS) ×2 IMPLANT
BANDAGE ESMARK 6X9 LF (GAUZE/BANDAGES/DRESSINGS) ×1 IMPLANT
BLADE SAG 18X100X1.27 (BLADE) ×2 IMPLANT
BLADE SAW SGTL 13.0X1.19X90.0M (BLADE) ×2 IMPLANT
BNDG CMPR 9X6 STRL LF SNTH (GAUZE/BANDAGES/DRESSINGS) ×1
BNDG ESMARK 6X9 LF (GAUZE/BANDAGES/DRESSINGS) ×2
CLOTH BEACON ORANGE TIMEOUT ST (SAFETY) ×2 IMPLANT
CONT SPECI 4OZ STER CLIK (MISCELLANEOUS) ×2 IMPLANT
CUFF TOURN SGL QUICK 34 (TOURNIQUET CUFF) ×2
CUFF TRNQT CYL 34X4X40X1 (TOURNIQUET CUFF) ×1 IMPLANT
DRAPE EXTREMITY T 121X128X90 (DRAPE) ×2 IMPLANT
DRAPE POUCH INSTRU U-SHP 10X18 (DRAPES) ×2 IMPLANT
DRAPE U-SHAPE 47X51 STRL (DRAPES) ×2 IMPLANT
DRSG PAD ABDOMINAL 8X10 ST (GAUZE/BANDAGES/DRESSINGS) ×2 IMPLANT
DURAPREP 26ML APPLICATOR (WOUND CARE) ×2 IMPLANT
ELECT REM PT RETURN 9FT ADLT (ELECTROSURGICAL) ×2
ELECTRODE REM PT RTRN 9FT ADLT (ELECTROSURGICAL) ×1 IMPLANT
EVACUATOR 1/8 PVC DRAIN (DRAIN) ×2 IMPLANT
FACESHIELD LNG OPTICON STERILE (SAFETY) ×10 IMPLANT
GAUZE XEROFORM 5X9 LF (GAUZE/BANDAGES/DRESSINGS) ×2 IMPLANT
GLOVE BIO SURGEON STRL SZ7.5 (GLOVE) ×4 IMPLANT
GLOVE BIOGEL PI IND STRL 8 (GLOVE) ×2 IMPLANT
GLOVE BIOGEL PI INDICATOR 8 (GLOVE) ×2
GLOVE ECLIPSE 8.0 STRL XLNG CF (GLOVE) ×2 IMPLANT
GOWN STRL REIN XL XLG (GOWN DISPOSABLE) ×2 IMPLANT
HANDPIECE INTERPULSE COAX TIP (DISPOSABLE) ×2
IMMOBILIZER KNEE 20 (SOFTGOODS) ×2
IMMOBILIZER KNEE 20 THIGH 36 (SOFTGOODS) ×1 IMPLANT
INSERT TIBIA BEARING PS 3 9MM (Insert) ×2 IMPLANT
KIT BASIN OR (CUSTOM PROCEDURE TRAY) ×2 IMPLANT
NS IRRIG 1000ML POUR BTL (IV SOLUTION) ×2 IMPLANT
PACK TOTAL JOINT (CUSTOM PROCEDURE TRAY) ×2 IMPLANT
PADDING CAST COTTON 6X4 STRL (CAST SUPPLIES) ×2 IMPLANT
POSITIONER SURGICAL ARM (MISCELLANEOUS) ×2 IMPLANT
SET HNDPC FAN SPRY TIP SCT (DISPOSABLE) ×1 IMPLANT
SET PAD KNEE POSITIONER (MISCELLANEOUS) ×2 IMPLANT
SPONGE GAUZE 4X4 12PLY (GAUZE/BANDAGES/DRESSINGS) ×2 IMPLANT
SPONGE LAP 18X18 X RAY DECT (DISPOSABLE) ×2 IMPLANT
STAPLER VISISTAT 35W (STAPLE) ×2 IMPLANT
SUCTION FRAZIER 12FR DISP (SUCTIONS) ×2 IMPLANT
SUT VIC AB 0 CT1 27 (SUTURE) ×6
SUT VIC AB 0 CT1 27XBRD ANTBC (SUTURE) ×3 IMPLANT
SUT VIC AB 1 CT1 27 (SUTURE) ×6
SUT VIC AB 1 CT1 27XBRD ANTBC (SUTURE) ×3 IMPLANT
SUT VIC AB 2-0 CT1 27 (SUTURE) ×6
SUT VIC AB 2-0 CT1 TAPERPNT 27 (SUTURE) ×3 IMPLANT
SUT VICRYL 4-0 (SUTURE) ×2 IMPLANT
SWAB COLLECTION DEVICE MRSA (MISCELLANEOUS) ×2 IMPLANT
TOWEL OR 17X26 10 PK STRL BLUE (TOWEL DISPOSABLE) ×6 IMPLANT
TRAY FOLEY CATH 14FRSI W/METER (CATHETERS) ×2 IMPLANT
TUBE ANAEROBIC SPECIMEN COL (MISCELLANEOUS) ×2 IMPLANT
WATER STERILE IRR 1500ML POUR (IV SOLUTION) ×2 IMPLANT

## 2013-04-14 NOTE — Anesthesia Preprocedure Evaluation (Addendum)
Anesthesia Evaluation  Patient identified by MRN, date of birth, ID band Patient awake    Reviewed: Allergy & Precautions, H&P , NPO status , Patient's Chart, lab work & pertinent test results  History of Anesthesia Complications (+) PONV  Airway Mallampati: II TM Distance: >3 FB Neck ROM: Full    Dental no notable dental hx. (+) Poor Dentition, Chipped and Dental Advisory Given,    Pulmonary neg pulmonary ROS,  breath sounds clear to auscultation  Pulmonary exam normal       Cardiovascular hypertension, Pt. on medications + Peripheral Vascular Disease negative cardio ROS  Rhythm:Regular Rate:Normal     Neuro/Psych negative neurological ROS  negative psych ROS   GI/Hepatic negative GI ROS, Neg liver ROS, PUD, GERD-  Medicated and Controlled,  Endo/Other  negative endocrine ROS  Renal/GU negative Renal ROS  negative genitourinary   Musculoskeletal negative musculoskeletal ROS (+)   Abdominal   Peds negative pediatric ROS (+)  Hematology negative hematology ROS (+)   Anesthesia Other Findings Translucent appearance to central incisors with visible post on back of teeth.  Reproductive/Obstetrics negative OB ROS                           Anesthesia Physical  Anesthesia Plan  ASA: II  Anesthesia Plan: Spinal   Post-op Pain Management:    Induction: Intravenous  Airway Management Planned:   Additional Equipment:   Intra-op Plan:   Post-operative Plan:   Informed Consent: I have reviewed the patients History and Physical, chart, labs and discussed the procedure including the risks, benefits and alternatives for the proposed anesthesia with the patient or authorized representative who has indicated his/her understanding and acceptance.   Dental advisory given  Plan Discussed with: CRNA  Anesthesia Plan Comments: (Discussed general versus spinal. She had spinal for her original  TKA in 2013 without complication. Discussed risks/benefits of spinal including headache, backache, failure, bleeding, infection, and nerve damage. Patient consents to spinal. Questions answered. Coagulation studies and platelet count acceptable.)      Anesthesia Quick Evaluation

## 2013-04-14 NOTE — H&P (Signed)
TOTAL KNEE REVISION ADMISSION H&P  Patient is being admitted for right revision total knee arthroplasty.  Subjective:  Chief Complaint:right knee pain.  HPI: Deanna Rodgers, 47 y.o. female, has a history of pain and functional disability in the right knee(s) due to painful right total knee arthroplasty and patient has failed non-surgical conservative treatments for greater than 12 weeks to include NSAID's and/or analgesics, flexibility and strengthening excercises, activity modification and a right knee arthroscopy. The indications for the revision of the total knee arthroplasty are bearing surface wear leading to symptomatic synovitis. Onset of symptoms was gradual starting 1 years ago with gradually worsening course since that time.  Prior procedures on the right knee(s) include arthroscopy and arthroplasty.  Patient currently rates pain in the right knee(s) at 10 out of 10 with activity. There is worsening of pain with activity and weight bearing, pain that interferes with activities of daily living, pain with passive range of motion and joint swelling.  Patient has no evidence of loosening by imaging studies. This condition presents safety issues increasing the risk of falls.  There is no current active infection.  Patient Active Problem List   Diagnosis Date Noted  . Painful total knee replacement 04/14/2013  . Synovitis of knee 12/23/2012  . Hypertension 10/16/2012  . Overdose 10/15/2012  . Degenerative arthritis of knee 12/25/2011  . HEMORRHOIDS-INTERNAL 10/14/2010  . HEMORRHOIDS-EXTERNAL 10/14/2010  . GERD 10/14/2010  . ULCER-DUODENAL 10/14/2010  . Irritable bowel syndrome 10/14/2010  . ACUT DUODEN ULCER W/PERF W/O MENTION OBSTRUCTION 10/28/2009  . IRON DEFICIENCY 08/08/2009  . DYSPEPSIA&OTHER SPEC DISORDERS FUNCTION STOMACH 08/08/2009  . RECTAL BLEEDING 08/08/2009   Past Medical History  Diagnosis Date  . Hypertension     borderline - no meds  . Arthritis   . Depression    . History of duodenal ulcer   . History of gastroesophageal reflux (GERD)   . Anemia     Past Surgical History  Procedure Laterality Date  . Cesarean section  12-24-11    '85- x1  . Knee arthroscopy  12-24-11    x2 -last 12'12([facility]  . Tubal ligation    . Knee arthroplasty  12/25/2011    Procedure: COMPUTER ASSISTED TOTAL KNEE ARTHROPLASTY;  Surgeon: [name], MD;  Location: [facility] ORS;  Service: Orthopedics;  Laterality: Right;  . Joint replacement  12/2011    rt total knee  . Knee arthroscopy Right 12/23/2012    Procedure: Right Knee Arthroscopy with minimal Debridement ;  Surgeon: [name], MD;  Location: [facility] ORS;  Service: Orthopedics;  Laterality: Right;  Right Knee Arthroscopy with minimal  Debridement   . Hemorroidectomy  yrs ago    No prescriptions prior to admission   No Known Allergies  History  Substance Use Topics  . Smoking status: Never Smoker   . Smokeless tobacco: Never Used  . Alcohol Use: No    No family history on file.    Review of Systems  Musculoskeletal: Positive for joint pain.  All other systems reviewed and are negative.     Objective:  Physical Exam  Constitutional: She is oriented to person, place, and time. She appears well-developed and well-nourished.  HENT:  Head: Normocephalic and atraumatic.  Eyes: EOM are normal. Pupils are equal, round, and reactive to light.  Neck: Normal range of motion. Neck supple.  Cardiovascular: Normal rate and regular rhythm.   Respiratory: Effort normal and breath sounds normal.  GI: Soft. Bowel sounds are normal.  Musculoskeletal:  Right knee: She exhibits swelling and effusion. Tenderness found.  Neurological: She is alert and oriented to person, place, and time.  Skin: Skin is warm and dry.  Psychiatric: She has a normal mood and affect.    Vital signs in last 24 hours:    Labs:  Estimated body mass index is 29.41 kg/(m^2) as calculated from the following:    Height as of 12/23/12: 5\' 3"  (1.6 m).   Weight as of 12/14/12: 75.297 kg (166 lb).  Assessment/Plan:  End stage arthritis, right knee(s) with failed previous arthroplasty.   The patient history, physical examination, clinical judgment of the provider and imaging studies are consistent with end stage degenerative joint disease of the right knee(s), previous total knee arthroplasty. Revision total knee arthroplasty is deemed medically necessary. The treatment options including medical management, injection therapy, arthroscopy and revision arthroplasty were discussed at length. The risks and benefits of revision total knee arthroplasty were presented and reviewed. The risks due to aseptic loosening, infection, stiffness, patella tracking problems, thromboembolic complications and other imponderables were discussed. The patient acknowledged the explanation, agreed to proceed with the plan and consent was signed. Patient is being admitted for inpatient treatment for surgery, pain control, PT, OT, prophylactic antibiotics, VTE prophylaxis, progressive ambulation and ADL's and discharge planning.The patient is planning to be discharged home with home health services

## 2013-04-14 NOTE — Preoperative (Signed)
Beta Blockers   Reason not to administer Beta Blockers:Not Applicable, not on home BB 

## 2013-04-14 NOTE — Anesthesia Procedure Notes (Signed)
Spinal  Patient location during procedure: OR Staffing Anesthesiologist: Azell Der Performed by: anesthesiologist  Preanesthetic Checklist Completed: patient identified, site marked, surgical consent, pre-op evaluation, timeout performed, IV checked, risks and benefits discussed and monitors and equipment checked Spinal Block Patient position: sitting Prep: Betadine Patient monitoring: heart rate, continuous pulse ox and blood pressure Approach: midline Location: L3-4 Injection technique: single-shot Needle Needle type: Sprotte  Needle gauge: 24 G Needle length: 9 cm Additional Notes Expiration date of kit checked and confirmed. Patient tolerated procedure well, without complications. No paresthesia. CSF without blood.

## 2013-04-14 NOTE — Progress Notes (Signed)
Patient arrived to unit at 1835,lethargic, c/o pain to R knee, placed patient comfortably in bed. Will endorsed accordingly.- Hulda Marin RN

## 2013-04-14 NOTE — Anesthesia Postprocedure Evaluation (Signed)
  Anesthesia Post-op Note  Patient: Deanna Rodgers  Procedure(s) Performed: Procedure(s) (LRB): Synovectomy right total knee with poly exchange (Right)  Patient Location: PACU  Anesthesia Type: Spinal  Level of Consciousness: awake and alert   Airway and Oxygen Therapy: Patient Spontanous Breathing  Post-op Pain: mild  Post-op Assessment: Post-op Vital signs reviewed, Patient's Cardiovascular Status Stable, Respiratory Function Stable, Patent Airway and No signs of Nausea or vomiting  Last Vitals:  Filed Vitals:   04/14/13 2040  BP: 140/95  Pulse: 99  Temp: 37.1 C  Resp: 16    Post-op Vital Signs: stable   Complications: No apparent anesthesia complications

## 2013-04-14 NOTE — Transfer of Care (Signed)
Immediate Anesthesia Transfer of Care Note  Patient: Deanna Rodgers  Procedure(s) Performed: Procedure(s): Synovectomy right total knee with poly exchange (Right)  Patient Location: PACU  Anesthesia Type:MAC and Spinal  Level of Consciousness: awake, alert , oriented, patient cooperative and responds to stimulation  Airway & Oxygen Therapy: Patient Spontanous Breathing and Patient connected to face mask oxygen  Post-op Assessment: Report given to PACU RN and Post -op Vital signs reviewed and stable  Post vital signs: Reviewed and stable, spinal T12  Complications: No apparent anesthesia complications

## 2013-04-14 NOTE — Brief Op Note (Signed)
04/14/2013  5:36 PM  PATIENT:  Deanna Rodgers  47 y.o. female  PRE-OPERATIVE DIAGNOSIS:  severe osteoarthritis right hip  POST-OPERATIVE DIAGNOSIS:  severe osteoarthritis right hip  PROCEDURE:  Procedure(s): Synovectomy right total knee with poly exchange (Right)  SURGEON:  Surgeon(s) and Role:    * [name], MD - Primary  PHYSICIAN ASSISTANT: Rexene Edison, PA-C  ANESTHESIA:   spinal  EBL:  Total I/O In: 2000 [I.V.:2000] Out: 140 [Urine:115; Blood:25]  BLOOD ADMINISTERED:none  DRAINS: none   LOCAL MEDICATIONS USED:  NONE  SPECIMEN:  No Specimen  DISPOSITION OF SPECIMEN:  N/A  COUNTS:  YES  TOURNIQUET:   Total Tourniquet Time Documented: Thigh (Right) - 26 minutes Total: Thigh (Right) - 26 minutes   DICTATION: .Other Dictation: Dictation Number [id number]  PLAN OF CARE: Admit to inpatient   PATIENT DISPOSITION:  PACU - hemodynamically stable.   Delay start of Pharmacological VTE agent (>24hrs) due to surgical blood loss or risk of bleeding: no

## 2013-04-15 LAB — BASIC METABOLIC PANEL
BUN: 12 mg/dL (ref 6–23)
CO2: 26 mEq/L (ref 19–32)
Calcium: 8.6 mg/dL (ref 8.4–10.5)
Chloride: 103 mEq/L (ref 96–112)
Creatinine, Ser: 0.53 mg/dL (ref 0.50–1.10)
GFR calc Af Amer: >90 mL/min (ref >90)
GFR calc non Af Amer: >90 mL/min (ref >90)
Glucose, Bld: 143 mg/dL — ABNORMAL HIGH (ref 70–99)
Potassium: 3.4 mEq/L — ABNORMAL LOW (ref 3.5–5.1)
Sodium: 137 mEq/L (ref 135–145)

## 2013-04-15 LAB — CBC
HCT: 32.2 % — ABNORMAL LOW (ref 36.0–46.0)
Hemoglobin: 9.8 g/dL — ABNORMAL LOW (ref 12.0–15.0)
MCH: 20.2 pg — ABNORMAL LOW (ref 26.0–34.0)
MCHC: 30.4 g/dL (ref 30.0–36.0)
MCV: 66.5 fL — ABNORMAL LOW (ref 78.0–100.0)
Platelets: 235 10*3/uL (ref 150–400)
RBC: 4.84 MIL/uL (ref 3.87–5.11)
RDW: 15.4 % (ref 11.5–15.5)
WBC: 10.6 10*3/uL — ABNORMAL HIGH (ref 4.0–10.5)

## 2013-04-15 MED ORDER — OXYCODONE-ACETAMINOPHEN 5-325 MG PO TABS: 1.0000 | ORAL_TABLET | ORAL | Status: DC | PRN

## 2013-04-15 MED ORDER — ASPIRIN 325 MG PO TBEC: 325.0000 mg | DELAYED_RELEASE_TABLET | Freq: Two times a day (BID) | ORAL | Status: DC

## 2013-04-15 NOTE — Evaluation (Signed)
Occupational Therapy Evaluation Patient Details Name: Deanna Rodgers MRN: [id number] DOB: 09-30-1966 Today's Date: 04/15/2013 Time: 4696-2952    OT Assessment / Plan / Recommendation Clinical Impression  Pt presents to OT s/p Synovectomy right total knee with poly exchange . Pt with decreased I with ADL activity and will benefit from skilled OT to increase I with ADL activity and return to PLOF    OT Assessment  Patient needs continued OT Services    Follow Up Recommendations  No OT follow up       Equipment Recommendations  3 in 1 bedside comode;Tub/shower bench;Other (comment) (walker)       Frequency  Min 2X/week    Precautions / Restrictions Precautions Precautions: None Restrictions Weight Bearing Restrictions: No       ADL  Lower Body Dressing: Moderate assistance Where Assessed - Lower Body Dressing: Supported sit to stand Toilet Transfer: Performed;Minimal Dentist Method: Sit to Barista: Comfort height toilet Toileting - Clothing Manipulation and Hygiene: Performed;Min guard Where Assessed - Glass blower/designer Manipulation and Hygiene: Standing    OT Diagnosis: Generalized weakness;Acute pain  OT Problem List: Decreased strength;Pain OT Treatment Interventions: Self-care/ADL training;Patient/family education   OT Goals Acute Rehab OT Goals OT Goal Formulation: With patient Time For Goal Achievement: 04/29/13 Potential to Achieve Goals: Good ADL Goals Pt Will Perform Grooming: with supervision;Standing at sink ADL Goal: Grooming - Progress: Goal set today Pt Will Perform Lower Body Dressing: with supervision;Sit to stand from chair ADL Goal: Lower Body Dressing - Progress: Goal set today Pt Will Transfer to Toilet: with supervision;Comfort height toilet ADL Goal: Toilet Transfer - Progress: Goal set today Pt Will Perform Toileting - Clothing Manipulation: with supervision  Visit Information  Last OT Received  On: 04/15/13    Subjective Data  Subjective: I had a replacement  a year ago- I thought i was healed   Prior Functioning     Home Living Lives With: Alone Available Help at Discharge: Friend(s);Family Type of Home: Apartment Home Access: Stairs to enter Secretary/administrator of Steps: 13 Entrance Stairs-Rails: Can reach both Home Layout: One level Bathroom Shower/Tub: Network engineer: None Prior Function Level of Independence: Independent Able to Take Stairs?: Yes Driving: Yes Communication Communication: No difficulties         Vision/Perception Vision - History Patient Visual Report: No change from baseline   Cognition  Cognition Arousal/Alertness: Awake/alert Behavior During Therapy: WFL for tasks assessed/performed Overall Cognitive Status: Within Functional Limits for tasks assessed    Extremity/Trunk Assessment Right Upper Extremity Assessment RUE ROM/Strength/Tone: [facility] for tasks assessed Left Upper Extremity Assessment LUE ROM/Strength/Tone: WFL for tasks assessed     Mobility Bed Mobility Bed Mobility: Supine to Sit Supine to Sit: 3: Mod assist;With rails;HOB elevated Transfers Transfers: Sit to Stand;Stand to Sit Sit to Stand: 4: Min assist;With upper extremity assist Stand to Sit: 4: Min assist;With upper extremity assist           End of Session OT - End of Session Activity Tolerance: Patient tolerated treatment well Patient left: in bed;with family/visitor present CPM Right Knee CPM Right Knee: Off  GO     Alba Cory 04/15/2013, 12:09 PM

## 2013-04-15 NOTE — Progress Notes (Signed)
Physical Therapy Treatment Patient Details Name: Deanna Rodgers MRN: [id number] DOB: November 14, 1965 Today's Date: 04/15/2013 Time: 7829-5621 PT Time Calculation (min): 16 min  PT Assessment / Plan / Recommendation Comments on Treatment Session       Follow Up Recommendations  Home health PT     Does the patient have the potential to tolerate intense rehabilitation     Barriers to Discharge None      Equipment Recommendations  Rolling walker with 5" wheels    Recommendations for Other Services    Frequency 7X/week   Plan Discharge plan remains appropriate    Precautions / Restrictions Precautions Precautions: Knee Restrictions Weight Bearing Restrictions: No Other Position/Activity Restrictions: WBAT   Pertinent Vitals/Pain 8/10: premed, cold packs provided    Mobility  Bed Mobility Bed Mobility: Supine to Sit;Sit to Supine Supine to Sit: 4: Min assist Sit to Supine: 4: Min assist Details for Bed Mobility Assistance: cues for sequence and use of L LE to self assist. Transfers Transfers: Sit to Stand;Stand to Sit Sit to Stand: 4: Min assist;With upper extremity assist Stand to Sit: 4: Min guard Details for Transfer Assistance: cues for LE management and use of UEs to self assist Ambulation/Gait Ambulation/Gait Assistance: 4: Min guard Ambulation Distance (Feet): 125 Feet Assistive device: Rolling walker Ambulation/Gait Assistance Details: min cues for sequence, posture and position from RW Gait Pattern: Step-to pattern;Decreased stance time - right Stairs: No Wheelchair Mobility Wheelchair Mobility: No    Exercises Total Joint Exercises Ankle Circles/Pumps: AROM;Both;20 reps Quad Sets: AROM;Right;10 reps Heel Slides: AAROM;Right;10 reps Hip ABduction/ADduction: AAROM;Right;10 reps Straight Leg Raises: AAROM;Right;10 reps   PT Diagnosis: Difficulty walking;Generalized weakness;Acute pain  PT Problem List: Decreased strength;Decreased range of motion;Decreased  activity tolerance;Decreased balance;Decreased mobility;Decreased knowledge of use of DME;Decreased safety awareness;Pain PT Treatment Interventions: DME instruction;Gait training;Stair training;Functional mobility training;Therapeutic activities;Therapeutic exercise;Balance training;Patient/family education   PT Goals Acute Rehab PT Goals PT Goal Formulation: With patient/family Time For Goal Achievement: 04/19/13 Potential to Achieve Goals: Good Pt will go Supine/Side to Sit: with supervision PT Goal: Supine/Side to Sit - Progress: Goal set today Pt will go Sit to Supine/Side: with supervision PT Goal: Sit to Supine/Side - Progress: Goal set today Pt will go Sit to Stand: with supervision PT Goal: Sit to Stand - Progress: Goal set today Pt will go Stand to Sit: with supervision PT Goal: Stand to Sit - Progress: Goal set today Pt will Ambulate: 51 - 150 feet;with supervision;with least restrictive assistive device PT Goal: Ambulate - Progress: Goal set today Pt will Go Up / Down Stairs: Flight;with supervision;with rail(s);with least restrictive assistive device PT Goal: Up/Down Stairs - Progress: Goal set today  Visit Information  Last PT Received On: 04/15/13 Assistance Needed: +1    Subjective Data  Subjective: I'm not going to the window Patient Stated Goal: to return home.   Cognition  Cognition Arousal/Alertness: Awake/alert Behavior During Therapy: WFL for tasks assessed/performed Overall Cognitive Status: Within Functional Limits for tasks assessed    Balance     End of Session PT - End of Session Activity Tolerance: Patient tolerated treatment well;Patient limited by pain Patient left: in bed;with call bell/phone within reach;with family/visitor present Nurse Communication: Mobility status CPM Right Knee CPM Right Knee: On   GP     Deanna Rodgers 04/15/2013, 3:20 PM

## 2013-04-15 NOTE — Op Note (Signed)
NAMELONETTE, STEVISON NO.:  [id number]  MEDICAL RECORD NO.:  [id number]  LOCATION:  1612                         FACILITY:  [facility]  PHYSICIAN:  Vanita Panda. 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  PROCEDURE:  Right total knee synovectomy and polyliner exchange.  FINDINGS: 1. No gross purulence or evidence of infection and no evidence of     loose prosthesis.  No evidence of polyethylene wear or metallosis. 2. Synovitis and effusion.  CULTURES:  Pending.  SURGEON:  Vanita Panda. Magnus Ivan, M.D.  ASSISTANT:  Richardean Canal, PA-C  ANESTHESIA:  Spinal.  ANTIBIOTICS:  1 g OF vancomycin after cultures obtained.  BLOOD LOSS:  Less than 50 mL.  TOURNIQUET TIME:  Less than 1 hour.  COMPLICATIONS:  None.  INDICATIONS:  Deanna Rodgers is a 47 year old well known to me.  She had a total knee arthroplasty over a year ago of her right knee due to severe arthritis for someone so young.  She has had problems with this total knee hurting her, eventually took her to the operating room for manipulation and an arthroscopic intervention due to scar tissue.  She has had some effusions at that time.  After then, her motion improved greatly and she had been on chronic pain medicine for some time and we will work on get her off of this.  She then came to the office with the knee hurt again and did have evidence of an effusion.  I have drained fluid off the knee and showed no evidence of infection.  Her white blood cell count, CRP and sed rate has always been normal as well.  Three- phase bone scan suggested synovitis and possible loosening of prosthesis.  At this point, I recommend she undergo an open  arthrotomy of the knee with the synovectomy and a poly exchange with worst case scenario being a revision of potential loose components.  The risks and benefits of the surgery were explained to her in detail and she did wish to proceed with the surgery.  PROCEDURE DESCRIPTION:  After informed consent was obtained, appropriate right leg was marked.  She was brought to the operating room and placed supine on the operating table.  She was side up on the operative table and spinal anesthesia was obtained.  She was then laid in a supine position.  A Foley catheter was placed.  A nonsterile tourniquet was placed around her upper right thigh.  Her right leg was then prepped and draped from the thigh down to the ankle with DuraPrep and sterile drapes including sterile stockinette.  A time-out was called and she was identified as correct patient and correct right knee.  I then used an Esmarch to wrap out the leg and tourniquet was inflated to 300 mm pressure.  I then made a midline incision proximally and distally and carried this through her previous incision and dissected down to the knee joint.  I then performed a  medial parapatellar arthrotomy and drained additional effusion from the knee.  We did send cultures off at this point and stat Gram stain.  I then removed synovium in the suprapatellar area and the medial and lateral gutters.  I removed the polyethylene and did not find any wear from the polyethylene.  There was no evidence of infection that we could see grossly at all.  I then put extraction device on the femur and targeted on the femur and showed no loosening at all.  We did the same thing for the tibia and found no loosening of the tibial component at all, that were nice and solid.  I then performed an additional synovectomy and irrigated the knee with 3 liters of normal saline solution using pulsatile lavage.  We then placed a 9-mm polyethylene back into the knee, which was a new  poly and I let the tourniquet down.  Hemostasis was obtained with electrocautery.  I then irrigated with another liter of fluid through the knee.  I then closed the arthrotomy with interrupted #1 Vicryl suture followed by 0 Vicryl to the deep tissue, 2-0 Vicryl to the subcuticular tissue 2-0 Vicryl in subcutaneous tissue and interrupted staples on the skin. Xeroform and well-padded sterile dressing was applied.  She was awakened, extubated, and taken to the recovery room in stable condition. All final counts were correct.  There were no complications noted.  On note, Richardean Canal, PA-C was present and assisted greatly in this case, which was quite difficult to remove the poly and perform a synovectomy without assistance.     Vanita Panda. Magnus Ivan, M.D.     CYB/MEDQ  D:  04/14/2013  T:  04/15/2013  Job:  [id number]

## 2013-04-15 NOTE — Progress Notes (Signed)
Subjective: 1 Day Post-Op Procedure(s) (LRB): Synovectomy right total knee with poly exchange (Right) Patient reports pain as moderate.    Objective: Vital signs in last 24 hours: Temp:  [97.2 F (36.2 C)-98.9 F (37.2 C)] 98.5 F (36.9 C) (06/14 0949) Pulse Rate:  [88-110] 110 (06/14 0949) Resp:  [13-18] 18 (06/14 0949) BP: (120-148)/(76-99) 120/76 mmHg (06/14 0949) SpO2:  [98 %-100 %] 100 % (06/14 0949) Weight:  [76.9 kg (169 lb 8.5 oz)] 76.9 kg (169 lb 8.5 oz) (06/13 2225)  Intake/Output from previous day: 06/13 0701 - 06/14 0700 In: 3041.3 [I.V.:3041.3] Out: 940 [Urine:915; Blood:25] Intake/Output this shift: Total I/O In: 240 [P.O.:240] Out: -    Recent Labs  04/15/13 0534  HGB 9.8*    Recent Labs  04/15/13 0534  WBC 10.6*  RBC 4.84  HCT 32.2*  PLT 235    Recent Labs  04/15/13 0534  NA 137  K 3.4*  CL 103  CO2 26  BUN 12  CREATININE 0.53  GLUCOSE 143*  CALCIUM 8.6   No results found for this basename: LABPT, INR,  in the last 72 hours  Neurologically intact Dorsiflexion/Plantar flexion intact  Assessment/Plan: 1 Day Post-Op Procedure(s) (LRB): Synovectomy right total knee with poly exchange (Right) Up with therapy .  She is working on quad contractions.  SL IV  Garik Diamant C 04/15/2013, 10:26 AM

## 2013-04-15 NOTE — Evaluation (Signed)
Physical Therapy Evaluation Patient Details Name: Deanna Rodgers MRN: [id number] DOB: 04-Jul-1966 Today's Date: 04/15/2013 Time: 4098-1191 PT Time Calculation (min): 14 min  PT Assessment / Plan / Recommendation Clinical Impression  Pt presents s/p synovectomy right total knee with poly exchange POD 1 with decreased strength, ROM and mobility.  Pt able to tolerate exercises in bed, however delinced OOB again due to she had just gotten back into bed with OT.  Pt will benefit from skilled PT in acute venue to address deficits.  PT recommends HHPT for follow up at D/C to maximize pts safety and function.     PT Assessment  Patient needs continued PT services    Follow Up Recommendations  Home health PT    Does the patient have the potential to tolerate intense rehabilitation      Barriers to Discharge None      Equipment Recommendations  Rolling walker with 5" wheels    Recommendations for Other Services     Frequency 7X/week    Precautions / Restrictions Precautions Precautions: Knee Restrictions Weight Bearing Restrictions: No Other Position/Activity Restrictions: WBAT   Pertinent Vitals/Pain 5/10 pain      Mobility  Bed Mobility Bed Mobility: Supine to Sit Supine to Sit: 3: Mod assist;With rails;HOB elevated Details for Bed Mobility Assistance: Not assessed as OT had just gotten pt back into bed.  Transfers Transfers: Not assessed Sit to Stand: 4: Min assist;With upper extremity assist Stand to Sit: 4: Min assist;With upper extremity assist Ambulation/Gait Ambulation/Gait Assistance: Not tested (comment) Stairs: No Wheelchair Mobility Wheelchair Mobility: No    Exercises Total Joint Exercises Ankle Circles/Pumps: AROM;Both;20 reps Quad Sets: AROM;Right;10 reps Heel Slides: AAROM;Right;10 reps Hip ABduction/ADduction: AAROM;Right;10 reps Straight Leg Raises: AAROM;Right;10 reps   PT Diagnosis: Difficulty walking;Generalized weakness;Acute pain  PT Problem  List: Decreased strength;Decreased range of motion;Decreased activity tolerance;Decreased balance;Decreased mobility;Decreased knowledge of use of DME;Decreased safety awareness;Pain PT Treatment Interventions: DME instruction;Gait training;Stair training;Functional mobility training;Therapeutic activities;Therapeutic exercise;Balance training;Patient/family education   PT Goals Acute Rehab PT Goals PT Goal Formulation: With patient/family Time For Goal Achievement: 04/19/13 Potential to Achieve Goals: Good Pt will go Supine/Side to Sit: with supervision PT Goal: Supine/Side to Sit - Progress: Goal set today Pt will go Sit to Supine/Side: with supervision PT Goal: Sit to Supine/Side - Progress: Goal set today Pt will go Sit to Stand: with supervision PT Goal: Sit to Stand - Progress: Goal set today Pt will go Stand to Sit: with supervision PT Goal: Stand to Sit - Progress: Goal set today Pt will Ambulate: 51 - 150 feet;with supervision;with least restrictive assistive device PT Goal: Ambulate - Progress: Goal set today Pt will Go Up / Down Stairs: Flight;with supervision;with rail(s);with least restrictive assistive device PT Goal: Up/Down Stairs - Progress: Goal set today  Visit Information  Last PT Received On: 04/15/13 Assistance Needed: +1    Subjective Data  Subjective: I want to do the CPM machine Patient Stated Goal: to return home.   Prior Functioning  Home Living Lives With: Alone Available Help at Discharge: Friend(s);Family Type of Home: Apartment Home Access: Stairs to enter Secretary/administrator of Steps: 13 Entrance Stairs-Rails: Can reach both Home Layout: One level Bathroom Shower/Tub: Network engineer: None Prior Function Level of Independence: Independent Able to Take Stairs?: Yes Driving: Yes Communication Communication: No difficulties    Cognition  Cognition Arousal/Alertness:  Awake/alert Behavior During Therapy: WFL for tasks assessed/performed Overall Cognitive Status: Within Functional Limits for  tasks assessed    Extremity/Trunk Assessment Right Upper Extremity Assessment RUE ROM/Strength/Tone: [facility] for tasks assessed Left Upper Extremity Assessment LUE ROM/Strength/Tone: WFL for tasks assessed Right Lower Extremity Assessment RLE ROM/Strength/Tone: Deficits RLE ROM/Strength/Tone Deficits: ankle motions WFL, unable to perform SLR without assist and increased pain.  knee flex ROM approx 40 deg.  RLE Sensation: WFL - Light Touch Left Lower Extremity Assessment LLE ROM/Strength/Tone: WFL for tasks assessed LLE Sensation: WFL - Light Touch   Balance    End of Session PT - End of Session Activity Tolerance: Patient limited by pain Patient left: in bed;with call bell/phone within reach;with family/visitor present CPM Right Knee CPM Right Knee: Off  GP     Vista Deck 04/15/2013, 12:22 PM

## 2013-04-16 LAB — CBC
HCT: 30.1 % — ABNORMAL LOW (ref 36.0–46.0)
Hemoglobin: 9.3 g/dL — ABNORMAL LOW (ref 12.0–15.0)
MCH: 20.7 pg — ABNORMAL LOW (ref 26.0–34.0)
MCHC: 30.9 g/dL (ref 30.0–36.0)
MCV: 66.9 fL — ABNORMAL LOW (ref 78.0–100.0)
Platelets: 211 10*3/uL (ref 150–400)
RBC: 4.5 MIL/uL (ref 3.87–5.11)
RDW: 15.7 % — ABNORMAL HIGH (ref 11.5–15.5)
WBC: 8.2 10*3/uL (ref 4.0–10.5)

## 2013-04-16 NOTE — Progress Notes (Signed)
Subjective: 2 Days Post-Op Procedure(s) (LRB): Synovectomy right total knee with poly exchange (Right) Patient reports pain as moderate.    Objective: Vital signs in last 24 hours: Temp:  [98.5 F (36.9 C)] 98.5 F (36.9 C) (06/15 0448) Pulse Rate:  [108] 108 (06/14 1459) Resp:  [16-18] 16 (06/15 0448) BP: (119-122)/(82-83) 122/82 mmHg (06/15 0448) SpO2:  [100 %] 100 % (06/15 0448)  Intake/Output from previous day: 06/14 0701 - 06/15 0700 In: 1245 [P.O.:720; I.V.:525] Out: 1000 [Urine:1000] Intake/Output this shift: Total I/O In: 240 [P.O.:240] Out: 500 [Urine:500]   Recent Labs  04/15/13 0534 04/16/13 0422  HGB 9.8* 9.3*    Recent Labs  04/15/13 0534 04/16/13 0422  WBC 10.6* 8.2  RBC 4.84 4.50  HCT 32.2* 30.1*  PLT 235 211    Recent Labs  04/15/13 0534  NA 137  K 3.4*  CL 103  CO2 26  BUN 12  CREATININE 0.53  GLUCOSE 143*  CALCIUM 8.6   No results found for this basename: LABPT, INR,  in the last 72 hours  Neurologically intact  Assessment/Plan: 2 Days Post-Op Procedure(s) (LRB): Synovectomy right total knee with poly exchange (Right) Up with therapy   Plan discharge tomorrow.    Mandalyn Pasqua C 04/16/2013, 9:58 AM

## 2013-04-16 NOTE — Progress Notes (Signed)
Physical Therapy Treatment Patient Details Name: Deanna Rodgers MRN: [id number] DOB: 09/10/1966 Today's Date: 04/16/2013 Time: 9147-8295 PT Time Calculation (min): 24 min  PT Assessment / Plan / Recommendation Comments on Treatment Session  Pt progressing with mobility, ambulated 220' with RW with supervision.     Follow Up Recommendations  Home health PT     Does the patient have the potential to tolerate intense rehabilitation     Barriers to Discharge        Equipment Recommendations  Rolling walker with 5" wheels    Recommendations for Other Services    Frequency 7X/week   Plan Discharge plan remains appropriate    Precautions / Restrictions Precautions Precautions: Knee Restrictions Weight Bearing Restrictions: No Other Position/Activity Restrictions: WBAT   Pertinent Vitals/Pain **7/10 R knee with activity Premedicated, ice applied*    Mobility  Transfers Transfers: Sit to Stand;Stand to Sit Stand to Sit: 4: Min assist;To chair/3-in-1;With upper extremity assist Details for Transfer Assistance: min A for RLE management Ambulation/Gait Ambulation/Gait Assistance: 4: Min guard Ambulation Distance (Feet): 220 Feet Assistive device: Rolling walker Gait Pattern: Step-to pattern;Decreased stance time - right General Gait Details: adjusted height of RW Stairs: No    Exercises Total Joint Exercises Ankle Circles/Pumps: AROM;Both;20 reps Quad Sets: AROM;Right;10 reps Heel Slides: AAROM;Right;10 reps Hip ABduction/ADduction: AAROM;Right;10 reps Straight Leg Raises: AAROM;Right;10 reps   PT Diagnosis:    PT Problem List:   PT Treatment Interventions:     PT Goals Acute Rehab PT Goals PT Goal Formulation: With patient/family Time For Goal Achievement: 04/19/13 Potential to Achieve Goals: Good Pt will go Supine/Side to Sit: with supervision Pt will go Sit to Supine/Side: with supervision Pt will go Sit to Stand: with supervision Pt will go Stand to Sit:  with supervision PT Goal: Stand to Sit - Progress: Progressing toward goal Pt will Ambulate: 51 - 150 feet;with supervision;with least restrictive assistive device PT Goal: Ambulate - Progress: Met Pt will Go Up / Down Stairs: Flight;with supervision;with rail(s);with least restrictive assistive device  Visit Information  Last PT Received On: 04/16/13    Subjective Data  Subjective: I'm feeling OK.  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

## 2013-04-16 NOTE — Progress Notes (Signed)
Nutrition Brief Note  Patient identified on the Malnutrition Screening Tool (MST) Report  Body mass index is 30.04 kg/(m^2). Patient meets criteria for obesity grade 1 based on current BMI.   Wt Readings from Last 10 Encounters:  04/14/13 169 lb 8.5 oz (76.9 kg)  04/14/13 169 lb 8.5 oz (76.9 kg)  04/06/13 169 lb 9.6 oz (76.93 kg)  12/23/12 166 lb (75.297 kg)  12/23/12 166 lb (75.297 kg)  12/14/12 166 lb (75.297 kg)  10/16/12 164 lb 3.9 oz (74.5 kg)  12/28/11 160 lb (72.576 kg)  12/28/11 160 lb (72.576 kg)  12/24/11 161 lb (73.029 kg)   No recent weight loss.  Current diet order is regular, patient is consuming approximately 100% of meals at this time. Labs and medications reviewed.    No nutrition interventions warranted at this time. If nutrition issues arise, please consult RD.   Oran Rein, RD, LDN Clinical Inpatient Dietitian Pager:  [phone] Weekend and after hours pager:  [phone]

## 2013-04-16 NOTE — Progress Notes (Signed)
Physical Therapy Treatment Patient Details Name: Deanna Rodgers MRN: [id number] DOB: June 08, 1966 Today's Date: 04/16/2013 Time: 7829-5621 PT Time Calculation (min): 23 min  PT Assessment / Plan / Recommendation Comments on Treatment Session  Pt ambulated 200', went up/down 3 stairs, and completed R knee exercises. R knee flexion limited to approx 40* AAROM by pain. Expect she will be ready to DC home tomorrow.     Follow Up Recommendations  Home health PT     Does the patient have the potential to tolerate intense rehabilitation     Barriers to Discharge        Equipment Recommendations  Rolling walker with 5" wheels    Recommendations for Other Services    Frequency 7X/week   Plan Discharge plan remains appropriate    Precautions / Restrictions Precautions Precautions: Knee Restrictions Weight Bearing Restrictions: No Other Position/Activity Restrictions: WBAT   Pertinent Vitals/Pain **7/10 R knee Premedicated, pt refused ice*    Mobility  Transfers Transfers: Sit to Stand;Stand to Sit Stand to Sit: 4: Min assist;With upper extremity assist;To bed Details for Transfer Assistance: min A for RLE management Ambulation/Gait Ambulation/Gait Assistance: 5: Supervision Ambulation Distance (Feet): 200 Feet Assistive device: Rolling walker Gait Pattern: Step-to pattern;Decreased stance time - right General Gait Details: adjusted height of RW Stairs: Yes Stairs Assistance: 5: Supervision Stair Management Technique: Two rails;Step to pattern;Forwards Number of Stairs: 3    Exercises Total Joint Exercises Ankle Circles/Pumps: AROM;Both;20 reps Quad Sets: AROM;Right;10 reps Short Arc QuadBarbaraann Boys;Right;10 reps;Supine Heel Slides: AAROM;Right;10 reps;Supine Hip ABduction/ADduction: AAROM;Right;10 reps;Supine Straight Leg Raises: AAROM;Right;10 reps;Supine   PT Diagnosis:    PT Problem List:   PT Treatment Interventions:     PT Goals Acute Rehab PT Goals PT Goal  Formulation: With patient/family Time For Goal Achievement: 04/19/13 Potential to Achieve Goals: Good Pt will go Supine/Side to Sit: with supervision Pt will go Sit to Supine/Side: with supervision Pt will go Sit to Stand: with supervision Pt will go Stand to Sit: with supervision PT Goal: Stand to Sit - Progress: Progressing toward goal Pt will Ambulate: 51 - 150 feet;>150 feet;with modified independence;with rolling walker PT Goal: Ambulate - Progress: Updated due to goal met Pt will Go Up / Down Stairs: Flight;with supervision;with rail(s);with least restrictive assistive device PT Goal: Up/Down Stairs - Progress: Progressing toward goal  Visit Information  Last PT Received On: 04/16/13 Assistance Needed: +1    Subjective Data  Subjective: I'm feeling OK.  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

## 2013-04-16 NOTE — Progress Notes (Signed)
Occupational Therapy Treatment Patient Details Name: Deanna Rodgers MRN: [id number] DOB: 08/22/1966 Today's Date: 04/16/2013 Time: 4098-1191 OT Time Calculation (min): 9 min  OT Assessment / Plan / Recommendation Comments on Treatment Session pt with very flat affect    Follow Up Recommendations  No OT follow up             Frequency Min 2X/week   Plan Discharge plan remains appropriate    Precautions / Restrictions Precautions Precautions: Knee Restrictions Weight Bearing Restrictions: No Other Position/Activity Restrictions: WBAT       ADL  Grooming: Performed;Wash/dry hands;Min guard Where Assessed - Grooming: Unsupported standing Toilet Transfer: Performed;Min guard Statistician Method: Sit to stand Toileting - Architect and Hygiene: Performed Where Assessed - Toileting Clothing Manipulation and Hygiene: Standing Transfers/Ambulation Related to ADLs: Pt with very flat affect. Pt wanted to get back in bed after going to bathroom and washing hands      OT Goals ADL Goals ADL Goal: Grooming - Progress: Progressing toward goals ADL Goal: Toilet Transfer - Progress: Progressing toward goals ADL Goal: Toileting - Clothing Manipulation - Progress: Progressing toward goals  Visit Information  Last OT Received On: 04/16/13    Subjective Data  Subjective: I want to get back in bed      Cognition  Cognition Arousal/Alertness: Awake/alert Behavior During Therapy: [facility] for tasks assessed/performed Overall Cognitive Status: Within Functional Limits for tasks assessed    Mobility  Transfers Stand to Sit: 4: Min assist;To chair/3-in-1;With upper extremity assist Details for Transfer Assistance: min A for RLE management          End of Session OT - End of Session Activity Tolerance: Patient limited by pain Patient left: in bed;with family/visitor present;with call bell/phone within reach  GO     Deanna Rodgers, Metro Kung 04/16/2013, 11:24 AM

## 2013-04-17 LAB — CBC
HCT: 32.7 % — ABNORMAL LOW (ref 36.0–46.0)
Hemoglobin: 9.6 g/dL — ABNORMAL LOW (ref 12.0–15.0)
MCH: 19.8 pg — ABNORMAL LOW (ref 26.0–34.0)
MCHC: 29.4 g/dL — ABNORMAL LOW (ref 30.0–36.0)
MCV: 67.3 fL — ABNORMAL LOW (ref 78.0–100.0)
Platelets: 255 10*3/uL (ref 150–400)
RBC: 4.86 MIL/uL (ref 3.87–5.11)
RDW: 15.6 % — ABNORMAL HIGH (ref 11.5–15.5)
WBC: 7.1 10*3/uL (ref 4.0–10.5)

## 2013-04-17 NOTE — Progress Notes (Signed)
Physical Therapy Treatment Patient Details Name: Deanna Rodgers MRN: [id number] DOB: 1966/02/08 Today's Date: 04/17/2013 Time: 4098-1191 PT Time Calculation (min): 24 min  PT Assessment / Plan / Recommendation Comments on Treatment Session  Pt progressing well and has practiced stairs.  Will be ready for D/C tomorrow.     Follow Up Recommendations  Home health PT     Does the patient have the potential to tolerate intense rehabilitation     Barriers to Discharge        Equipment Recommendations  Rolling walker with 5" wheels    Recommendations for Other Services    Frequency 7X/week   Plan Discharge plan remains appropriate;Frequency remains appropriate    Precautions / Restrictions Precautions Precautions: Knee Restrictions Weight Bearing Restrictions: No Other Position/Activity Restrictions: WBAT   Pertinent Vitals/Pain 5/10 pain, ice packs applied    Mobility  Bed Mobility Bed Mobility: Supine to Sit;Sit to Supine Supine to Sit: 4: Min assist Sit to Supine: 4: Min assist Details for Bed Mobility Assistance: Assist for RLE out of bed with cues for hand placement on bed instead of rails/trapeze to better simulate home environment.  Transfers Transfers: Sit to Stand;Stand to Sit Sit to Stand: 5: Supervision;With upper extremity assist;From bed Stand to Sit: 5: Supervision;With upper extremity assist;To bed Details for Transfer Assistance: Min cues for hand placement.  Ambulation/Gait Ambulation/Gait Assistance: 5: Supervision Ambulation Distance (Feet): 135 Feet Assistive device: Rolling walker Ambulation/Gait Assistance Details: Min cues for sequencing/technique and to maintain upright posture.  Gait Pattern: Step-to pattern;Decreased stance time - right Stairs: Yes Stairs Assistance: 5: Supervision Stairs Assistance Details (indicate cue type and reason): Min cues for sequencing/technique with bilateral handrails.  Stair Management Technique: Two rails;Step to  pattern;Forwards Number of Stairs: 7    Exercises     PT Diagnosis:    PT Problem List:   PT Treatment Interventions:     PT Goals Acute Rehab PT Goals PT Goal Formulation: With patient/family Time For Goal Achievement: 04/19/13 Potential to Achieve Goals: Good Pt will go Supine/Side to Sit: with supervision PT Goal: Supine/Side to Sit - Progress: Progressing toward goal Pt will go Sit to Supine/Side: with supervision PT Goal: Sit to Supine/Side - Progress: Progressing toward goal Pt will go Sit to Stand: with supervision PT Goal: Sit to Stand - Progress: Met Pt will go Stand to Sit: with supervision PT Goal: Stand to Sit - Progress: Met Pt will Ambulate: 51 - 150 feet;>150 feet;with modified independence;with rolling walker PT Goal: Ambulate - Progress: Progressing toward goal Pt will Go Up / Down Stairs: Flight;with supervision;with rail(s);with least restrictive assistive device PT Goal: Up/Down Stairs - Progress: Partly met  Visit Information  Last PT Received On: 04/17/13 Assistance Needed: +1 Reason Eval/Treat Not Completed: Medical issues which prohibited therapy (R knee actively bleeding at incision site, RN notified)    Subjective Data  Subjective: My knee was bleeding earlier Patient Stated Goal: to get home to her dog   Cognition  Cognition Arousal/Alertness: Awake/alert Behavior During Therapy: WFL for tasks assessed/performed Overall Cognitive Status: Within Functional Limits for tasks assessed    Balance     End of Session PT - End of Session Activity Tolerance: Patient tolerated treatment well Patient left: in bed;with call bell/phone within reach Nurse Communication: Mobility status CPM Right Knee CPM Right Knee: On   GP     Vista Deck 04/17/2013, 3:22 PM

## 2013-04-17 NOTE — Progress Notes (Signed)
Physical Therapy Treatment Patient Details Name: Deanna Rodgers MRN: [id number] DOB: 06/12/1966 Today's Date: 04/17/2013 Time: 6213-0865 PT Time Calculation (min): 24 min  PT Assessment / Plan / Recommendation Comments on Treatment Session  Pt ambulated 220' with RW. R knee flexion continues to be limited at 40* AAROM. Pt stated MD plans to DC pt home tomorrow. Will do flight of stairs next session today.    Follow Up Recommendations  Home health PT     Does the patient have the potential to tolerate intense rehabilitation     Barriers to Discharge        Equipment Recommendations  Rolling walker with 5" wheels    Recommendations for Other Services    Frequency 7X/week   Plan Discharge plan remains appropriate;Frequency remains appropriate    Precautions / Restrictions Precautions Precautions: Knee Restrictions Weight Bearing Restrictions: No Other Position/Activity Restrictions: WBAT   Pertinent Vitals/Pain **8/10 R knee with walking, 6/10 at rest Premedicated, ice applied, requested muscle relaxer*    Mobility  Bed Mobility Bed Mobility: Supine to Sit;Sit to Supine Supine to Sit: 4: Min assist Details for Bed Mobility Assistance: cues for sequence and use of L LE to self assist. Transfers Transfers: Sit to Stand;Stand to Sit Sit to Stand: With upper extremity assist;6: Modified independent (Device/Increase time);From bed;From chair/3-in-1;With armrests Stand to Sit: 6: Modified independent (Device/Increase time);With armrests;To chair/3-in-1 Ambulation/Gait Ambulation/Gait Assistance: 5: Supervision Ambulation Distance (Feet): 220 Feet Assistive device: Rolling walker Gait Pattern: Step-to pattern;Decreased stance time - right General Gait Details: VCs to flex R knee during swing phase Stairs: No    Exercises Total Joint Exercises Ankle Circles/Pumps: AROM;Both;20 reps Quad Sets: AROM;Right;10 reps Heel Slides: AAROM;Right;10 reps;Supine Straight Leg Raises:  AAROM;Right;10 reps;Supine Long Arc Quad: AAROM;Right;10 reps;Seated Knee Flexion: AAROM;Right;10 reps;Seated Goniometric ROM: knee flexion AAROM 40*   PT Diagnosis:    PT Problem List:   PT Treatment Interventions:     PT Goals Acute Rehab PT Goals PT Goal Formulation: With patient/family Time For Goal Achievement: 04/19/13 Potential to Achieve Goals: Good Pt will go Supine/Side to Sit: with supervision PT Goal: Supine/Side to Sit - Progress: Progressing toward goal Pt will go Sit to Supine/Side: with supervision Pt will go Sit to Stand: with supervision PT Goal: Sit to Stand - Progress: Met Pt will go Stand to Sit: with supervision PT Goal: Stand to Sit - Progress: Met Pt will Ambulate: 51 - 150 feet;>150 feet;with modified independence;with rolling walker PT Goal: Ambulate - Progress: Progressing toward goal Pt will Go Up / Down Stairs: Flight;with supervision;with rail(s);with least restrictive assistive device  Visit Information  Last PT Received On: 04/17/13 Assistance Needed: +1    Subjective Data  Subjective: Knee is painful this morning. Patient Stated Goal: to get home to her dog   Cognition  Cognition Arousal/Alertness: Awake/alert Behavior During Therapy: WFL for tasks assessed/performed Overall Cognitive Status: Within Functional Limits for tasks assessed    Balance     End of Session PT - End of Session Activity Tolerance: Patient limited by pain;Patient tolerated treatment well Patient left: in chair;with call bell/phone within reach;with family/visitor present Nurse Communication: Mobility status CPM Right Knee CPM Right Knee: Off   GP     Deanna Rodgers 04/17/2013, 9:30 AM [phone]

## 2013-04-17 NOTE — Progress Notes (Signed)
Occupational Therapy Treatment Patient Details Name: Deanna Rodgers MRN: [id number] DOB: 07/02/1966 Today's Date: 04/17/2013 Time: 0932-1000 OT Time Calculation (min): 28 min  OT Assessment / Plan / Recommendation Comments on Treatment Session  Pt seemed to feel better this OT session.  Pt more participative                   Plan Discharge plan remains appropriate    Precautions / Restrictions Precautions Precautions: Knee Restrictions Weight Bearing Restrictions: No Other Position/Activity Restrictions: WBAT       ADL  Grooming: Performed;Wash/dry hands;Supervision/safety Where Assessed - Grooming: Unsupported standing Lower Body Dressing: Performed;Minimal assistance Where Assessed - Lower Body Dressing: Unsupported sit to stand Toilet Transfer: Performed;Supervision/safety Toilet Transfer Method: Sit to Barista: Comfort height toilet Where Assessed - Toileting Clothing Manipulation and Hygiene: Standing ADL Comments: Pt seemed to feel better this day.  [name]        OT Goals ADL Goals ADL Goal: Grooming - Progress: Met ADL Goal: Lower Body Dressing - Progress: Progressing toward goals ADL Goal: Toilet Transfer - Progress: Progressing toward goals ADL Goal: Toileting - Clothing Manipulation - Progress: Met  Visit Information  Last OT Received On: 04/17/13 Assistance Needed: +1    Subjective Data  Subjective: I am just ready to go home      Cognition  Cognition Arousal/Alertness: Awake/alert Behavior During Therapy: WFL for tasks assessed/performed Overall Cognitive Status: Within Functional Limits for tasks assessed    Mobility  Bed Mobility Bed Mobility: Supine to Sit;Sit to Supine Supine to Sit: 4: Min assist Details for Bed Mobility Assistance: cues for sequence and use of L LE to self assist. Transfers Transfers: Sit to Stand;Stand to Sit Sit to Stand: 5: Supervision;From chair/3-in-1;From toilet;With  upper extremity assist Stand to Sit: To chair/3-in-1;To toilet;5: Supervision;With upper extremity assist    Exercises  Total Joint Exercises Ankle Circles/Pumps: AROM;Both;20 reps Quad Sets: AROM;Right;10 reps Heel Slides: AAROM;Right;10 reps;Supine Straight Leg Raises: AAROM;Right;10 reps;Supine Long Arc Quad: AAROM;Right;10 reps;Seated Knee Flexion: AAROM;Right;10 reps;Seated Goniometric ROM: knee flexion AAROM 40*      End of Session OT - End of Session Activity Tolerance: Patient tolerated treatment well Patient left: in chair;with call bell/phone within reach CPM Right Knee CPM Right Knee: Off       Laporsche Hoeger, Metro Kung 04/17/2013, 10:06 AM

## 2013-04-17 NOTE — Progress Notes (Signed)
Subjective: 3 Days Post-Op Procedure(s) (LRB): Synovectomy right total knee with poly exchange (Right) Patient reports pain as moderate.  Daughter will be available tomorrow to help patient at home.  Objective: Vital signs in last 24 hours: Temp:  [98 F (36.7 C)-98.1 F (36.7 C)] 98 F (36.7 C) (06/16 0630) Pulse Rate:  [91-102] 91 (06/16 0630) Resp:  [16] 16 (06/16 0630) BP: (103-130)/(71-90) 120/87 mmHg (06/16 0630) SpO2:  [98 %-99 %] 98 % (06/16 0630)  Intake/Output from previous day: 06/15 0701 - 06/16 0700 In: 240 [P.O.:240] Out: 500 [Urine:500] Intake/Output this shift:     Recent Labs  04/15/13 0534 04/16/13 0422 04/17/13 0417  HGB 9.8* 9.3* 9.6*    Recent Labs  04/16/13 0422 04/17/13 0417  WBC 8.2 7.1  RBC 4.50 4.86  HCT 30.1* 32.7*  PLT 211 255    Recent Labs  04/15/13 0534  NA 137  K 3.4*  CL 103  CO2 26  BUN 12  CREATININE 0.53  GLUCOSE 143*  CALCIUM 8.6   No results found for this basename: LABPT, INR,  in the last 72 hours  Neurologically intact Sensation intact distally Intact pulses distally Incision: dressing C/D/I Compartment soft  Assessment/Plan: 3 Days Post-Op Procedure(s) (LRB): Synovectomy right total knee with poly exchange (Right) Up with therapy D/C to home in AM  Deanna Rodgers, Deanna Rodgers 04/17/2013, 11:01 AM

## 2013-04-17 NOTE — Progress Notes (Signed)
Small amount of bloody drainage noted from distal area of Right total knee incision. Staples intact. Incision redressed and ice packs applied.

## 2013-04-17 NOTE — Care Management Note (Signed)
    Page 1 of 2   04/18/2013     11:56:54 AM   CARE MANAGEMENT NOTE 04/18/2013  Patient:  Deanna Rodgers, Deanna Rodgers   Account Number:  [id number]  Date Initiated:  04/17/2013  Documentation initiated by:  Colleen Can  Subjective/Objective Assessment:   dx synovectomy rt total knee with polyexchange     Action/Plan:   CM spoke with patient. Plans are for her to return to her home in [place] where friend will be caregiver. She is requesting [name] for [facility] services. Will need RW, tub bench, poss 3n1.   Anticipated DC Date:  04/18/2013   Anticipated DC Plan:  HOME W HOME HEALTH SERVICES      DC Planning Services  CM consult      [facility] Choice  HOME HEALTH  DURABLE MEDICAL EQUIPMENT   Choice offered to / List presented to:  C-1 Patient   DME arranged  3-N-1  WALKER - ROLLING  SHOWER STOOL        HH arranged  HH-2 PT      HH agency  [facility]   Status of service:  Completed, signed off Medicare Important Message given?  NA - LOS <3 / Initial given by admissions (If response is "NO", the following Medicare IM given date fields will be blank) Date Medicare IM given:   Date Additional Medicare IM given:    Discharge Disposition:    Per UR Regulation:  Reviewed for med. necessity/level of care/duration of stay  If discussed at Long Length of Stay Meetings, dates discussed:    Comments:  04/18/2013 Colleen Can BSN RN CCM [phone] Pt is requesting RW. and tub bench and 3n1. DME has been delivered to patient's room. [name] will provide [facility] services. Anticipate d/c today with start date of services tomorrow.

## 2013-04-17 NOTE — Progress Notes (Signed)
PT Cancellation Note  Patient Details Name: Deanna Rodgers MRN: [id number] DOB: [birth year]   Cancelled Treatment:    Reason Eval/Treat Not Completed: Medical issues which prohibited therapy (R knee actively bleeding at incision site, RN notified). Pt reported no trauma to knee.  Will follow.    [name] 04/17/2013, 1:58 PM [phone]

## 2013-04-18 ENCOUNTER — Encounter (HOSPITAL_COMMUNITY): Payer: Self-pay | Admitting: Orthopaedic Surgery

## 2013-04-18 LAB — WOUND CULTURE
Culture: NO GROWTH
Gram Stain: NONE SEEN

## 2013-04-18 NOTE — Progress Notes (Signed)
Patient discharged. Rx for percocet given. States understanding of discharge instructions.

## 2013-04-18 NOTE — Progress Notes (Signed)
Received orders for rw, commode, and tub bench.  All delivered to the patient's room.

## 2013-04-18 NOTE — Discharge Summary (Signed)
Patient ID: GUNDA MAQUEDA MRN: [id number] DOB/AGE: 07-07-1966 46 y.o.  Admit date: 04/14/2013 Discharge date: 04/18/2013  Admission Diagnoses:  Principal Problem:   Painful total knee replacement   Discharge Diagnoses:  Same  Past Medical History  Diagnosis Date  . Hypertension     borderline - no meds  . Arthritis   . Depression   . History of duodenal ulcer   . History of gastroesophageal reflux (GERD)   . Anemia     Surgeries: Procedure(s): Synovectomy right total knee with poly exchange on 04/14/2013   Consultants:   Physical Therapy and Case management  Discharged Condition: Improved  Hospital Course: Deanna Rodgers is an 47 y.o. female who was admitted 04/14/2013 for operative treatment ofPainful total knee replacement. Patient has severe unremitting pain that affects sleep, daily activities, and work/hobbies. After pre-op clearance the patient was taken to the operating room on 04/14/2013 and underwent  Procedure(s): Synovectomy right total knee with poly exchange.    Patient was given perioperative antibiotics: Anti-infectives   Start     Dose/Rate Route Frequency Ordered Stop   04/14/13 2000  ceFAZolin (ANCEF) IVPB 1 g/50 mL premix     1 g 100 mL/hr over 30 Minutes Intravenous Every 6 hours 04/14/13 1846 04/15/13 0228       Patient was given sequential compression devices, early ambulation, and chemoprophylaxis to prevent DVT.  Patient benefited maximally from hospital stay and there were no complications.    Recent vital signs: Patient Vitals for the past 24 hrs:  BP Temp Temp src Pulse Resp SpO2  04/18/13 0615 112/70 mmHg 98.3 F (36.8 C) Oral 91 18 99 %  04/17/13 2158 118/72 mmHg 98.6 F (37 C) Oral 96 18 99 %  04/17/13 1400 110/79 mmHg 98.9 F (37.2 C) Oral 95 18 100 %     Recent laboratory studies:  Recent Labs  04/16/13 0422 04/17/13 0417  WBC 8.2 7.1  HGB 9.3* 9.6*  HCT 30.1* 32.7*  PLT 211 255     Discharge Medications:      Medication List    STOP taking these medications       HYDROcodone-acetaminophen 5-325 MG per tablet  Commonly known as:  NORCO/VICODIN      TAKE these medications       ALPRAZolam 0.25 MG tablet  Commonly known as:  XANAX  Take 0.25 mg by mouth daily as needed for sleep.     aspirin 325 MG EC tablet  Take 1 tablet (325 mg total) by mouth 2 (two) times daily after a meal.     cyclobenzaprine 10 MG tablet  Commonly known as:  FLEXERIL  Take 10 mg by mouth 3 (three) times daily as needed for muscle spasms.     eszopiclone 1 MG Tabs  Commonly known as:  LUNESTA  Take 1 mg by mouth at bedtime. Take immediately before bedtime     losartan-hydrochlorothiazide 50-12.5 MG per tablet  Commonly known as:  HYZAAR  Take 1 tablet by mouth every morning.     oxyCODONE-acetaminophen 5-325 MG per tablet  Commonly known as:  ROXICET  Take 1-2 tablets by mouth every 4 (four) hours as needed for pain.     sertraline 50 MG tablet  Commonly known as:  ZOLOFT  Take 50 mg by mouth daily with lunch.     Vitamin D (Ergocalciferol) 50000 UNITS Caps  Commonly known as:  DRISDOL  Take 50,000 Units by mouth every 7 (seven) days.  Diagnostic Studies: Nm Bone Scan 3 Phase Lower Extremity  03/20/2013   *RADIOLOGY REPORT*  Clinical Data: Right knee pain  NUCLEAR MEDICINE THREE PHASE BONE SCAN  Technique:  Radionuclide angiographic images, immediate static blood pool images, and 3-hour delayed static images were obtained after intravenous injection of radiopharmaceutical.  Radiopharmaceutical: CURIE TC-MDP TECHNETIUM TC 49M MEDRONATE IV KIT  Comparison: 11/03/2012  Findings: On the flow phase portion of the examination there is asymmetric increased radiotracer uptake to the right knee.  On the blood pool and delayed phase images there is asymmetric radiotracer uptake localizing to the right knee.  IMPRESSION:  1.  Increased radiotracer uptake on all three phases localizing to the right  knee which may reflect arthroplasty device loosening or infection.  When compared with previous exam the appearance is not significantly changed.   Original Report Authenticated By: Signa Kell, M.D.    Disposition: 01-Home or Self Care        Follow-up Information   Follow up with [name], MD In 2 weeks.   Contact information:   [address] [place] [address] [phone]        Signed: Richardean Canal 04/18/2013, 9:07 AM

## 2013-04-18 NOTE — Progress Notes (Signed)
Physical Therapy Treatment Patient Details Name: MATAYA KILDUFF MRN: [id number] DOB: 07/06/1966 Today's Date: 04/18/2013 Time: 5784-6962 PT Time Calculation (min): 42 min  PT Assessment / Plan / Recommendation Comments on Treatment Session  POD # 4 R TKR Poly exchange planning to D/C to home today.  Assisted pt OOB to BR to void, amb in hallway then back to bed to perform TE's.  Pt required increased time, freq rest breaks due to "cramping" and max motivation to perform all TE's.  Applied ICE after.     Follow Up Recommendations  Home health PT     Does the patient have the potential to tolerate intense rehabilitation     Barriers to Discharge        Equipment Recommendations  Rolling walker with 5" wheels    Recommendations for Other Services    Frequency 7X/week   Plan Discharge plan remains appropriate;Frequency remains appropriate    Precautions / Restrictions Precautions Precautions: Knee Restrictions Weight Bearing Restrictions: No Other Position/Activity Restrictions: WBAT   Pertinent Vitals/Pain C/o 5/10 R knee pain and "cramping" during gait meds requested ICE applied    Mobility  Bed Mobility Bed Mobility: Supine to Sit;Sit to Supine Supine to Sit: 4: Min guard Sit to Supine: 4: Min guard Details for Bed Mobility Assistance: increased time and minguard assist to support R LE off and back on bed Transfers Transfers: Sit to Stand;Stand to Sit Sit to Stand: 5: Supervision;With upper extremity assist;From bed;From toilet Stand to Sit: 5: Supervision;With upper extremity assist;To bed;To toilet Details for Transfer Assistance: assisted pt OOb to BR to void.  Pt required increased time. Ambulation/Gait Ambulation/Gait Assistance: 5: Supervision Ambulation Distance (Feet): 140 Feet Assistive device: Rolling walker Ambulation/Gait Assistance Details: increased time and 25% VC's on safety with turns while keeping AD at appropriate distance.  Increased time. Gait  Pattern: Step-to pattern;Decreased stance time - right Gait velocity: decreased    Exercises   Total Knee Replacement TE's 10 reps B LE ankle pumps 10 reps knee presses 10 reps heel slides  10 reps SAQ's 10 reps SLR's 10 reps ABD Followed by ICE   PT Goals                                                            progressing    Visit Information  Last PT Received On: 04/18/13 Assistance Needed: +1    Subjective Data      Cognition    good   Balance   fair+  End of Session PT - End of Session Equipment Utilized During Treatment: Gait belt Activity Tolerance: Patient tolerated treatment well Patient left: in bed;with call bell/phone within reach   Felecia Shelling  PTA [facility]  Acute  Rehab Pager      [phone]

## 2013-04-18 NOTE — Progress Notes (Signed)
Richardean Canal, PA informed him patient c/o tingling to right foot this am but states better after PT, PA states ok for discharge

## 2013-04-18 NOTE — Progress Notes (Signed)
Subjective: 4 Days Post-Op Procedure(s) (LRB): Synovectomy right total knee with poly exchange (Right) Patient reports pain as moderate.  Wanting to go home.  Objective: Vital signs in last 24 hours: Temp:  [98.3 F (36.8 C)-98.9 F (37.2 C)] 98.3 F (36.8 C) (06/17 0615) Pulse Rate:  [91-96] 91 (06/17 0615) Resp:  [18] 18 (06/17 0615) BP: (110-118)/(70-79) 112/70 mmHg (06/17 0615) SpO2:  [99 %-100 %] 99 % (06/17 0615)  Intake/Output from previous day: 06/16 0701 - 06/17 0700 In: 372 [P.O.:372] Out: 151 [Urine:151] Intake/Output this shift:     Recent Labs  04/16/13 0422 04/17/13 0417  HGB 9.3* 9.6*    Recent Labs  04/16/13 0422 04/17/13 0417  WBC 8.2 7.1  RBC 4.50 4.86  HCT 30.1* 32.7*  PLT 211 255   No results found for this basename: NA, K, CL, CO2, BUN, CREATININE, GLUCOSE, CALCIUM,  in the last 72 hours No results found for this basename: LABPT, INR,  in the last 72 hours  Neurologically intact Sensation intact distally Intact pulses distally Incision: dressing C/D/I Compartment soft  Assessment/Plan: 4 Days Post-Op Procedure(s) (LRB): Synovectomy right total knee with poly exchange (Right) Discharge home with home health Follow up at 2 weeks post -op with Dr. Vilinda Blanks, GILBERT 04/18/2013, 9:01 AM

## 2013-04-19 LAB — ANAEROBIC CULTURE: Gram Stain: NONE SEEN

## 2013-06-19 ENCOUNTER — Encounter (HOSPITAL_COMMUNITY): Payer: Self-pay | Admitting: *Deleted

## 2013-06-19 ENCOUNTER — Inpatient Hospital Stay (HOSPITAL_COMMUNITY)
Admission: AD | Admit: 2013-06-19 | Discharge: 2013-06-19 | Disposition: A | Discharge status: Home / Self Care | Payer: Medicaid Other | Source: Ambulatory Visit | Attending: Obstetrics and Gynecology | Admitting: Obstetrics and Gynecology

## 2013-06-19 ENCOUNTER — Ambulatory Visit: Payer: Medicaid Other | Attending: Orthopaedic Surgery | Admitting: Physical Therapy

## 2013-06-19 DIAGNOSIS — R635 Abnormal weight gain: Secondary | ICD-10-CM | POA: Insufficient documentation

## 2013-06-19 DIAGNOSIS — M25569 Pain in unspecified knee: Secondary | ICD-10-CM | POA: Insufficient documentation

## 2013-06-19 DIAGNOSIS — M25669 Stiffness of unspecified knee, not elsewhere classified: Secondary | ICD-10-CM | POA: Insufficient documentation

## 2013-06-19 DIAGNOSIS — N926 Irregular menstruation, unspecified: Secondary | ICD-10-CM

## 2013-06-19 DIAGNOSIS — IMO0001 Reserved for inherently not codable concepts without codable children: Secondary | ICD-10-CM | POA: Insufficient documentation

## 2013-06-19 DIAGNOSIS — R609 Edema, unspecified: Secondary | ICD-10-CM | POA: Insufficient documentation

## 2013-06-19 DIAGNOSIS — Z3202 Encounter for pregnancy test, result negative: Secondary | ICD-10-CM | POA: Insufficient documentation

## 2013-06-19 DIAGNOSIS — R262 Difficulty in walking, not elsewhere classified: Secondary | ICD-10-CM | POA: Insufficient documentation

## 2013-06-19 DIAGNOSIS — N912 Amenorrhea, unspecified: Secondary | ICD-10-CM | POA: Insufficient documentation

## 2013-06-19 DIAGNOSIS — Z96659 Presence of unspecified artificial knee joint: Secondary | ICD-10-CM | POA: Insufficient documentation

## 2013-06-19 HISTORY — DX: Unspecified abnormal cytological findings in specimens from cervix uteri: R87.619

## 2013-06-19 HISTORY — DX: Reserved for concepts with insufficient information to code with codable children: IMO0002

## 2013-06-19 LAB — POCT PREGNANCY, URINE: Preg Test, Ur: NEGATIVE

## 2013-06-19 NOTE — MAU Provider Note (Signed)
History     CSN: [id number]  Arrival date and time: 06/19/13 1401   First Provider Initiated Contact with Patient 06/19/13 1759      Chief Complaint  Patient presents with  . Possible Pregnancy   Possible Pregnancy Pertinent negatives include no chest pain, chills, fever or headaches.   47 y.o. presents for eval of missed period this month. Otherwise in normal state of health. Pt wanted to be evaluated.  Pertinent Gynecological History: Menses: regular every month with spotting approximately 6 days per month but light last monght, and none this month Bleeding: decreasing Contraception: tubal ligation   Past Medical History  Diagnosis Date  . Hypertension     borderline - no meds  . Arthritis   . Depression   . History of duodenal ulcer   . History of gastroesophageal reflux (GERD)   . Anemia   . Abnormal Pap smear     bx    Past Surgical History  Procedure Laterality Date  . Cesarean section  12-24-11    '85- x1  . Knee arthroscopy  12-24-11    x[facility] -[facility]  . Tubal ligation    . Knee arthroplasty  12/25/2011    Procedure: COMPUTER ASSISTED TOTAL KNEE ARTHROPLASTY;  Surgeon: [name], MD;  Location: [facility];  Service: Orthopedics;  Laterality: Right;  . Joint replacement  12/2011    rt total knee  . Knee arthroscopy Right 12/23/2012    Procedure: Right Knee Arthroscopy with minimal Debridement ;  Surgeon: [name], MD;  Location: [facility];  Service: Orthopedics;  Laterality: Right;  Right Knee Arthroscopy with minimal  Debridement   . Hemorroidectomy  yrs ago  . Total knee revision Right 04/14/2013    Procedure: Synovectomy right total knee with poly exchange;  Surgeon: [name], MD;  Location: [facility];  Service: Orthopedics;  Laterality: Right;    Family History  Problem Relation Age of Onset  . Hypertension Father   . Stroke Father   . Hypertension Maternal Aunt   . Cancer Maternal Uncle   . Cancer  Paternal Uncle   . Hypertension Maternal Grandmother   . Stroke Maternal Grandmother     History  Substance Use Topics  . Smoking status: Never Smoker   . Smokeless tobacco: Never Used  . Alcohol Use: No    Allergies: No Known Allergies  Prescriptions prior to admission  Medication Sig Dispense Refill  . ALPRAZolam (XANAX) 0.25 MG tablet Take 0.25 mg by mouth daily as needed for sleep.      Marland Kitchen aspirin EC 325 MG EC tablet Take 1 tablet (325 mg total) by mouth 2 (two) times daily after a meal.  30 tablet  0  . cyclobenzaprine (FLEXERIL) 10 MG tablet Take 10 mg by mouth 3 (three) times daily as needed for muscle spasms.      . eszopiclone (LUNESTA) 1 MG TABS Take 1 mg by mouth at bedtime. Take immediately before bedtime      . losartan-hydrochlorothiazide (HYZAAR) 50-12.5 MG per tablet Take 1 tablet by mouth every morning.      . sertraline (ZOLOFT) 50 MG tablet Take 50 mg by mouth daily with lunch.      . Vitamin D, Ergocalciferol, (DRISDOL) 50000 UNITS CAPS Take 50,000 Units by mouth every 7 (seven) days.      . [DISCONTINUED] oxyCODONE-acetaminophen (ROXICET) 5-325 MG per tablet Take 1-2 tablets by mouth every 4 (four) hours as needed for pain.  60  tablet  0    Review of Systems  Constitutional: Negative for fever and chills.  Eyes: Negative for blurred vision and double vision.  Respiratory: Negative for shortness of breath.   Cardiovascular: Negative for chest pain.  Gastrointestinal: Negative for heartburn.  Neurological: Negative for headaches.   Physical Exam   Blood pressure 138/102, pulse 112, resp. rate 16, height 5' 4.5" (1.638 m), weight 76.658 kg (169 lb), last menstrual period 05/05/2013, SpO2 100.00%.  Physical Exam  Constitutional: She appears well-developed and well-nourished. No distress.  HENT:  Head: Normocephalic and atraumatic.  Eyes: EOM are normal.  Neck: Normal range of motion. Neck supple.  Cardiovascular: Normal rate, regular rhythm, normal heart  sounds and intact distal pulses.  Exam reveals no gallop and no friction rub.   No murmur heard. Respiratory: Effort normal and breath sounds normal. No respiratory distress. She has no wheezes. She has no rales.  GI: Soft. Bowel sounds are normal. She exhibits no distension and no mass. There is no tenderness. There is no rebound and no guarding.  Skin: She is not diaphoretic.  neg urine preg test  MAU Course  Procedures  MDM Likely perimenopausal. reasssured and discharged. Recommend establish care with [facility] for continued annual screening  Assessment and Plan  Deanna Rodgers is a 47 y.o. [id number] here with sx c/w perimenopause. Reassured and recommended establish normal care. No meds or additional treatment or testing required at this time. Give Tubal pregnancy precautions Deanna Rodgers but discussed possibility of Urine pregnancy false negative rate in early pregnancy.) pt states udnerstanding and discahrge.  Deanna Rodgers 06/19/2013, 6:10 PM

## 2013-06-19 NOTE — MAU Note (Signed)
First time she had skipped a cycle. Last month was short. States has recently gained 10lbs. Been doing some crazy eating in the middle of the night.

## 2013-06-19 NOTE — MAU Note (Signed)
Patient states she has missed her period in August. Denies pain or discharge. Has gained weight.

## 2013-06-21 NOTE — MAU Provider Note (Signed)
Attestation of Attending Supervision of Advanced Practitioner (CNM/NP): Evaluation and management procedures were performed by the Advanced Practitioner under my supervision and collaboration.  I have reviewed the Advanced Practitioner's note and chart, and I agree with the management and plan.  Gayna Braddy 06/21/2013 9:15 AM   

## 2013-07-14 ENCOUNTER — Inpatient Hospital Stay (HOSPITAL_COMMUNITY)
Admission: AD | Admit: 2013-07-14 | Discharge: 2013-07-14 | Disposition: A | Discharge status: Home / Self Care | Payer: Medicaid Other | Source: Ambulatory Visit | Attending: Obstetrics & Gynecology | Admitting: Obstetrics & Gynecology

## 2013-07-14 ENCOUNTER — Encounter (HOSPITAL_COMMUNITY): Payer: Self-pay | Admitting: *Deleted

## 2013-07-14 ENCOUNTER — Inpatient Hospital Stay (HOSPITAL_COMMUNITY): Payer: Medicaid Other

## 2013-07-14 DIAGNOSIS — N949 Unspecified condition associated with female genital organs and menstrual cycle: Secondary | ICD-10-CM | POA: Insufficient documentation

## 2013-07-14 DIAGNOSIS — R109 Unspecified abdominal pain: Secondary | ICD-10-CM | POA: Insufficient documentation

## 2013-07-14 DIAGNOSIS — M549 Dorsalgia, unspecified: Secondary | ICD-10-CM | POA: Insufficient documentation

## 2013-07-14 DIAGNOSIS — D251 Intramural leiomyoma of uterus: Secondary | ICD-10-CM | POA: Insufficient documentation

## 2013-07-14 LAB — CBC WITH DIFFERENTIAL/PLATELET
Basophils Absolute: 0 10*3/uL (ref 0.0–0.1)
Basophils Relative: 1 % (ref 0–1)
Eosinophils Absolute: 0.1 10*3/uL (ref 0.0–0.7)
Eosinophils Relative: 1 % (ref 0–5)
HCT: 35.9 % — ABNORMAL LOW (ref 36.0–46.0)
Hemoglobin: 11.1 g/dL — ABNORMAL LOW (ref 12.0–15.0)
Lymphocytes Relative: 33 % (ref 12–46)
Lymphs Abs: 2 10*3/uL (ref 0.7–4.0)
MCH: 20.7 pg — ABNORMAL LOW (ref 26.0–34.0)
MCHC: 30.9 g/dL (ref 30.0–36.0)
MCV: 66.9 fL — ABNORMAL LOW (ref 78.0–100.0)
Monocytes Absolute: 0.5 10*3/uL (ref 0.1–1.0)
Monocytes Relative: 8 % (ref 3–12)
Neutro Abs: 3.4 10*3/uL (ref 1.7–7.7)
Neutrophils Relative %: 57 % (ref 43–77)
Platelets: 175 10*3/uL (ref 150–400)
RBC: 5.37 MIL/uL — ABNORMAL HIGH (ref 3.87–5.11)
RDW: 15.8 % — ABNORMAL HIGH (ref 11.5–15.5)
WBC: 6 10*3/uL (ref 4.0–10.5)

## 2013-07-14 LAB — URINALYSIS, ROUTINE W REFLEX MICROSCOPIC
Bilirubin Urine: NEGATIVE
Glucose, UA: NEGATIVE mg/dL
Hgb urine dipstick: NEGATIVE
Ketones, ur: 15 mg/dL — AB
Leukocytes, UA: NEGATIVE
Nitrite: NEGATIVE
Protein, ur: NEGATIVE mg/dL
Specific Gravity, Urine: 1.02 (ref 1.005–1.030)
Urobilinogen, UA: 1 mg/dL (ref 0.0–1.0)
pH: 6 (ref 5.0–8.0)

## 2013-07-14 LAB — POCT PREGNANCY, URINE: Preg Test, Ur: NEGATIVE

## 2013-07-14 MED ORDER — MEDROXYPROGESTERONE ACETATE 10 MG PO TABS: 10.0000 mg | ORAL_TABLET | Freq: Every day | ORAL | Status: DC

## 2013-07-14 MED ORDER — HYDROCODONE-ACETAMINOPHEN 7.5-300 MG PO TABS: 1.0000 | ORAL_TABLET | Freq: Four times a day (QID) | ORAL | Status: DC | PRN

## 2013-07-14 NOTE — MAU Provider Note (Signed)
History     CSN: [id number]  Arrival date and time: 07/14/13 1324   First Provider Initiated Contact with Patient 07/14/13 1048      Chief Complaint  Patient presents with  . Abdominal Pain   HPI: Lower Abdominal Pressure / Pain -Onset: End of July, gradually worsening.  -Lower abdominal enlargement, soreness, and pressure. "Feels like my uterus is falling out when I stand".  -Associated Sx: Back pain (started yesterday), Urinary hesitancy & frequency, Amenorrhea (3 months). -Worse with standing and movement. No alleviating symptoms. -Surgical h/o CS and BTL. [id number]   Past Medical History  Diagnosis Date  . Hypertension     borderline - no meds  . Arthritis   . Depression   . History of duodenal ulcer   . History of gastroesophageal reflux (GERD)   . Anemia   . Abnormal Pap smear     bx    Past Surgical History  Procedure Laterality Date  . Cesarean section  12-24-11    '85- x1  . Knee arthroscopy  12-24-11    x2 -[facility]  . Tubal ligation    . Knee arthroplasty  12/25/2011    Procedure: COMPUTER ASSISTED TOTAL KNEE ARTHROPLASTY;  Surgeon: [name], MD;  Location: [facility];  Service: Orthopedics;  Laterality: Right;  . Joint replacement  12/2011    rt total knee  . Knee arthroscopy Right 12/23/2012    Procedure: Right Knee Arthroscopy with minimal Debridement ;  Surgeon: [name], MD;  Location: [facility];  Service: Orthopedics;  Laterality: Right;  Right Knee Arthroscopy with minimal  Debridement   . Hemorroidectomy  yrs ago  . Total knee revision Right 04/14/2013    Procedure: Synovectomy right total knee with poly exchange;  Surgeon: [name], MD;  Location: [facility];  Service: Orthopedics;  Laterality: Right;    Family History  Problem Relation Age of Onset  . Hypertension Father   . Stroke Father   . Hypertension Maternal Aunt   . Cancer Maternal Uncle   . Cancer Paternal Uncle   . Hypertension Maternal  Grandmother   . Stroke Maternal Grandmother     History  Substance Use Topics  . Smoking status: Never Smoker   . Smokeless tobacco: Never Used  . Alcohol Use: No    Allergies: No Known Allergies  Prescriptions prior to admission  Medication Sig Dispense Refill  . ALPRAZolam (XANAX) 0.25 MG tablet Take 0.25 mg by mouth daily as needed for sleep.      . cyclobenzaprine (FLEXERIL) 10 MG tablet Take 10 mg by mouth 3 (three) times daily as needed for muscle spasms.      Marland Kitchen losartan-hydrochlorothiazide (HYZAAR) 50-12.5 MG per tablet Take 1 tablet by mouth every morning.      . sertraline (ZOLOFT) 50 MG tablet Take 50 mg by mouth daily with lunch.      . traZODone (DESYREL) 100 MG tablet Take 100 mg by mouth at bedtime.      . Vitamin D, Ergocalciferol, (DRISDOL) 50000 UNITS CAPS Take 50,000 Units by mouth every 7 (seven) days.        Review of Systems  Constitutional: Negative for fever, chills and malaise/fatigue.  Gastrointestinal: Positive for abdominal pain (Lower). Negative for nausea, vomiting, diarrhea and constipation.  Genitourinary: Positive for frequency and hematuria. Negative for dysuria and urgency.       Endorses hesitancy. Denies incontenence, vaginal D/C, vaginal bleeding.  Musculoskeletal: Positive for back pain (Lower -  Sharp like a "nagging toothache").   Physical Exam   Blood pressure 129/90, pulse 104, temperature 98.6 F (37 C), temperature source Oral, resp. rate 18, height 5\' 3"  (1.6 m), weight 78.109 kg (172 lb 3.2 oz), last menstrual period 05/05/2013.  Physical Exam  Constitutional: She appears well-developed and well-nourished. No distress.  HENT:  Head: Normocephalic and atraumatic.  Cardiovascular: Regular rhythm, normal heart sounds and intact distal pulses.  Exam reveals no gallop and no friction rub.   No murmur heard. Tachycardia.  Respiratory: Effort normal and breath sounds normal. No respiratory distress. She has no wheezes. She has no rales.   GI: Soft. She exhibits distension (Slight). She exhibits no mass. There is tenderness (Along lower abdomen). There is no rebound and no guarding.  BS hypoactive in LLQ, otherwise normally active BS. Tympanic to Percussion throughout. Visible scar from prior CS.  Skin: She is not diaphoretic.    MAU Course  Procedures  Results for orders placed during the hospital encounter of 07/14/13 (from the past 24 hour(s))  URINALYSIS, ROUTINE W REFLEX MICROSCOPIC     Status: Abnormal   Collection Time    07/14/13  9:48 AM      Result Value Range   Color, Urine YELLOW  YELLOW   APPearance HAZY (*) CLEAR   Specific Gravity, Urine 1.020  1.005 - 1.030   pH 6.0  5.0 - 8.0   Glucose, UA NEGATIVE  NEGATIVE mg/dL   Hgb urine dipstick NEGATIVE  NEGATIVE   Bilirubin Urine NEGATIVE  NEGATIVE   Ketones, ur 15 (*) NEGATIVE mg/dL   Protein, ur NEGATIVE  NEGATIVE mg/dL   Urobilinogen, UA 1.0  0.0 - 1.0 mg/dL   Nitrite NEGATIVE  NEGATIVE   Leukocytes, UA NEGATIVE  NEGATIVE  POCT PREGNANCY, URINE     Status: None   Collection Time    07/14/13 10:10 AM      Result Value Range   Preg Test, Ur NEGATIVE  NEGATIVE  CBC WITH DIFFERENTIAL     Status: Abnormal   Collection Time    07/14/13 11:22 AM      Result Value Range   WBC 6.0  4.0 - 10.5 K/uL   RBC 5.37 (*) 3.87 - 5.11 MIL/uL   Hemoglobin 11.1 (*) 12.0 - 15.0 g/dL   HCT 40.9 (*) 81.1 - 91.4 %   MCV 66.9 (*) 78.0 - 100.0 fL   MCH 20.7 (*) 26.0 - 34.0 pg   MCHC 30.9  30.0 - 36.0 g/dL   RDW 78.2 (*) 95.6 - 21.3 %   Platelets 175  150 - 400 K/uL   Neutrophils Relative % 57  43 - 77 %   Neutro Abs 3.4  1.7 - 7.7 K/uL   Lymphocytes Relative 33  12 - 46 %   Lymphs Abs 2.0  0.7 - 4.0 K/uL   Monocytes Relative 8  3 - 12 %   Monocytes Absolute 0.5  0.1 - 1.0 K/uL   Eosinophils Relative 1  0 - 5 %   Eosinophils Absolute 0.1  0.0 - 0.7 K/uL   Basophils Relative 1  0 - 1 %   Basophils Absolute 0.0  0.0 - 0.1 K/uL   US Transvaginal Non-ob  07/14/2013    CLINICAL DATA:  Pelvic pain and fullness.  EXAM: TRANSABDOMINAL AND TRANSVAGINAL ULTRASOUND OF PELVIS  TECHNIQUE: Both transabdominal and transvaginal ultrasound examinations of the pelvis were performed. Transabdominal technique was performed for global imaging of the pelvis including uterus, ovaries,  adnexal regions, and pelvic cul-de-sac. It was necessary to proceed with endovaginal exam following the transabdominal exam to visualize the endometrium and adnexa.  COMPARISON:  None  FINDINGS: Uterus  Measurements: 12.4 by 6.1 by 7.7 cm. A small intramural fibroid is seen in the anterior right corpus measuring 2.3 cm in maximum diameter. No other fibroids identified.  Endometrium  Thickness: 12 mm. 3.1 x 1.8 x 2.0 cm.  Right ovary  Measurements: 3.1 x 1.8 x 2.0 cm. Normal appearance/no adnexal mass.  Left ovary  Measurements: 3.1 x 2.1 x 2.2 cm. Normal appearance/no adnexal mass.  Other findings  No free fluid.  IMPRESSION: Small right anterior uterine fibroid measuring 2.3 cm.  Normal in appearance of both ovaries. No adnexal mass or free fluid identified.   Electronically Signed   By: Myles Rosenthal   On: 07/14/2013 12:15   US Pelvis Complete  07/14/2013   CLINICAL DATA:  Pelvic pain and fullness.  EXAM: TRANSABDOMINAL AND TRANSVAGINAL ULTRASOUND OF PELVIS  TECHNIQUE: Both transabdominal and transvaginal ultrasound examinations of the pelvis were performed. Transabdominal technique was performed for global imaging of the pelvis including uterus, ovaries, adnexal regions, and pelvic cul-de-sac. It was necessary to proceed with endovaginal exam following the transabdominal exam to visualize the endometrium and adnexa.  COMPARISON:  None  FINDINGS: Uterus  Measurements: 12.4 by 6.1 by 7.7 cm. A small intramural fibroid is seen in the anterior right corpus measuring 2.3 cm in maximum diameter. No other fibroids identified.  Endometrium  Thickness: 12 mm. 3.1 x 1.8 x 2.0 cm.  Right ovary  Measurements: 3.1 x 1.8 x 2.0  cm. Normal appearance/no adnexal mass.  Left ovary  Measurements: 3.1 x 2.1 x 2.2 cm. Normal appearance/no adnexal mass.  Other findings  No free fluid.  IMPRESSION: Small right anterior uterine fibroid measuring 2.3 cm.  Normal in appearance of both ovaries. No adnexal mass or free fluid identified.   Electronically Signed   By: Myles Rosenthal   On: 07/14/2013 12:15   MDM: THe patient's history, alongside a lack of significant findings on above labs / tests, points to patient being likely peri-menopausal. PCP has checked her thyroid recently, which was normal.   Assessment and Plan  #Perimenopause (Working diagnosis) -Provera Challenge test -Vicodin for abdominal and back pain -Refer to [facility] clinic -Needs Endometrial biopsy in clinic   LICHTENSTEIN, JONATHAN 07/14/2013, 12:51 PM    Seen and examined by me. Agree with note.  Wynelle Bourgeois

## 2013-07-14 NOTE — MAU Note (Signed)
Lower abd pressure since yesterday, saw her MD this a.m., he felt her abd was swollen & she needed to be seen here.  Neg UPT 2 weeks ago.  BTL 18 years ago.  Denies bleeding or discharge.  Last period was 3 months ago.

## 2013-07-31 ENCOUNTER — Other Ambulatory Visit (HOSPITAL_COMMUNITY): Payer: Self-pay | Admitting: Orthopaedic Surgery

## 2013-08-02 ENCOUNTER — Encounter (HOSPITAL_COMMUNITY): Payer: Self-pay | Admitting: Pharmacy Technician

## 2013-08-02 ENCOUNTER — Encounter (HOSPITAL_COMMUNITY)
Admission: RE | Admit: 2013-08-02 | Discharge: 2013-08-02 | Disposition: A | Discharge status: Home / Self Care | Payer: Medicaid Other | Source: Ambulatory Visit | Attending: Orthopaedic Surgery | Admitting: Orthopaedic Surgery

## 2013-08-02 ENCOUNTER — Ambulatory Visit (HOSPITAL_COMMUNITY)
Admission: RE | Admit: 2013-08-02 | Discharge: 2013-08-02 | Disposition: A | Discharge status: Home / Self Care | Payer: Medicaid Other | Source: Ambulatory Visit | Attending: Orthopaedic Surgery | Admitting: Orthopaedic Surgery

## 2013-08-02 ENCOUNTER — Encounter (HOSPITAL_COMMUNITY): Payer: Self-pay

## 2013-08-02 DIAGNOSIS — Z01812 Encounter for preprocedural laboratory examination: Secondary | ICD-10-CM | POA: Insufficient documentation

## 2013-08-02 DIAGNOSIS — Z01818 Encounter for other preprocedural examination: Secondary | ICD-10-CM | POA: Insufficient documentation

## 2013-08-02 HISTORY — DX: Pain, unspecified: R52

## 2013-08-02 LAB — CBC
HCT: 36.3 % (ref 36.0–46.0)
Hemoglobin: 11.5 g/dL — ABNORMAL LOW (ref 12.0–15.0)
MCH: 21.3 pg — ABNORMAL LOW (ref 26.0–34.0)
MCHC: 31.7 g/dL (ref 30.0–36.0)
MCV: 67.2 fL — ABNORMAL LOW (ref 78.0–100.0)
Platelets: 235 10*3/uL (ref 150–400)
RBC: 5.4 MIL/uL — ABNORMAL HIGH (ref 3.87–5.11)
RDW: 15.4 % (ref 11.5–15.5)
WBC: 4.7 10*3/uL (ref 4.0–10.5)

## 2013-08-02 LAB — BASIC METABOLIC PANEL
BUN: 12 mg/dL (ref 6–23)
CO2: 28 mEq/L (ref 19–32)
Calcium: 9.1 mg/dL (ref 8.4–10.5)
Chloride: 99 mEq/L (ref 96–112)
Creatinine, Ser: 0.55 mg/dL (ref 0.50–1.10)
GFR calc Af Amer: >90 mL/min (ref >90)
GFR calc non Af Amer: >90 mL/min (ref >90)
Glucose, Bld: 88 mg/dL (ref 70–99)
Potassium: 3.4 mEq/L — ABNORMAL LOW (ref 3.5–5.1)
Sodium: 136 mEq/L (ref 135–145)

## 2013-08-02 LAB — URINALYSIS, ROUTINE W REFLEX MICROSCOPIC
Bilirubin Urine: NEGATIVE
Glucose, UA: NEGATIVE mg/dL
Hgb urine dipstick: NEGATIVE
Ketones, ur: NEGATIVE mg/dL
Leukocytes, UA: NEGATIVE
Nitrite: NEGATIVE
Protein, ur: NEGATIVE mg/dL
Specific Gravity, Urine: 1.032 — ABNORMAL HIGH (ref 1.005–1.030)
Urobilinogen, UA: 1 mg/dL (ref 0.0–1.0)
pH: 6.5 (ref 5.0–8.0)

## 2013-08-02 LAB — HCG, SERUM, QUALITATIVE: Preg, Serum: NEGATIVE

## 2013-08-02 LAB — PROTIME-INR
INR: 1.08 (ref 0.00–1.49)
Prothrombin Time: 13.8 seconds (ref 11.6–15.2)

## 2013-08-02 LAB — APTT: aPTT: 27 seconds (ref 24–37)

## 2013-08-02 LAB — SURGICAL PCR SCREEN
MRSA, PCR: NEGATIVE
Staphylococcus aureus: NEGATIVE

## 2013-08-02 NOTE — Pre-Procedure Instructions (Signed)
EKG AND CXR WERE DONE TODAY - PREOP AT [facility]. 

## 2013-08-02 NOTE — Patient Instructions (Addendum)
YOUR SURGERY IS SCHEDULED AT [facility]  ON:  Friday 10/10  REPORT TO [place] [facility] AT:  5:30 AM      PHONE # FOR SHORT STAY IS [phone]  DO NOT EAT OR DRINK ANYTHING AFTER MIDNIGHT THE NIGHT BEFORE YOUR SURGERY.  YOU MAY BRUSH YOUR TEETH, RINSE OUT YOUR MOUTH--BUT NO WATER, NO FOOD, NO CHEWING GUM, NO MINTS, NO CANDIES, NO CHEWING TOBACCO.  PLEASE TAKE THE FOLLOWING MEDICATIONS THE AM OF YOUR SURGERY WITH A FEW SIPS OF WATER:   XANAX, BUPROPION, ZOLOFT    DO NOT BRING VALUABLES, MONEY, CREDIT CARDS.  DO NOT WEAR JEWELRY, MAKE-UP, NAIL POLISH AND NO METAL PINS OR CLIPS IN YOUR HAIR. CONTACT LENS, DENTURES / PARTIALS, GLASSES SHOULD NOT BE WORN TO SURGERY AND IN MOST CASES-HEARING AIDS WILL NEED TO BE REMOVED.  BRING YOUR GLASSES CASE, ANY EQUIPMENT NEEDED FOR YOUR CONTACT LENS. FOR PATIENTS ADMITTED TO THE HOSPITAL--CHECK OUT TIME THE DAY OF DISCHARGE IS 11:00 AM.  ALL INPATIENT ROOMS ARE PRIVATE - WITH BATHROOM, TELEPHONE, TELEVISION AND WIFI INTERNET.                            PLEASE READ OVER ANY  FACT SHEETS THAT YOU WERE GIVEN: MRSA INFORMATION, BLOOD TRANSFUSION INFORMATION, INCENTIVE SPIROMETER INFORMATION. FAILURE TO FOLLOW THESE INSTRUCTIONS MAY RESULT IN THE CANCELLATION OF YOUR SURGERY.   PATIENT SIGNATURE_________________________________

## 2013-08-03 ENCOUNTER — Other Ambulatory Visit (HOSPITAL_COMMUNITY): Payer: Self-pay | Admitting: Orthopaedic Surgery

## 2013-08-10 NOTE — Anesthesia Preprocedure Evaluation (Addendum)
Anesthesia Evaluation  Patient identified by MRN, date of birth, ID band Patient awake    Reviewed: Allergy & Precautions, H&P , NPO status , Patient's Chart, lab work & pertinent test results  Airway Mallampati: II TM Distance: >3 FB Neck ROM: Full    Dental no notable dental hx.    Pulmonary neg pulmonary ROS,  breath sounds clear to auscultation  Pulmonary exam normal       Cardiovascular hypertension, Pt. on medications Rhythm:Regular Rate:Normal     Neuro/Psych negative neurological ROS  negative psych ROS   GI/Hepatic negative GI ROS, Neg liver ROS,   Endo/Other  negative endocrine ROS  Renal/GU negative Renal ROS  negative genitourinary   Musculoskeletal negative musculoskeletal ROS (+)   Abdominal   Peds negative pediatric ROS (+)  Hematology negative hematology ROS (+)   Anesthesia Other Findings   Reproductive/Obstetrics negative OB ROS                           Anesthesia Physical Anesthesia Plan  ASA: II  Anesthesia Plan: Spinal   Post-op Pain Management:    Induction: Intravenous  Airway Management Planned: Simple Face Mask  Additional Equipment:   Intra-op Plan:   Post-operative Plan:   Informed Consent: I have reviewed the patients History and Physical, chart, labs and discussed the procedure including the risks, benefits and alternatives for the proposed anesthesia with the patient or authorized representative who has indicated his/her understanding and acceptance.   Dental advisory given  Plan Discussed with: CRNA and Surgeon  Anesthesia Plan Comments:         Anesthesia Quick Evaluation  

## 2013-08-11 ENCOUNTER — Encounter (HOSPITAL_COMMUNITY): Payer: Medicaid Other | Admitting: Anesthesiology

## 2013-08-11 ENCOUNTER — Inpatient Hospital Stay (HOSPITAL_COMMUNITY): Payer: Medicaid Other | Admitting: Anesthesiology

## 2013-08-11 ENCOUNTER — Inpatient Hospital Stay (HOSPITAL_COMMUNITY)
Admission: RE | Admit: 2013-08-11 | Discharge: 2013-08-14 | DRG: 467 | Disposition: A | Discharge status: Home Health | Payer: Medicaid Other | Source: Ambulatory Visit | Attending: Orthopaedic Surgery | Admitting: Orthopaedic Surgery

## 2013-08-11 ENCOUNTER — Inpatient Hospital Stay (HOSPITAL_COMMUNITY): Payer: Medicaid Other

## 2013-08-11 ENCOUNTER — Other Ambulatory Visit: Payer: Self-pay | Admitting: Obstetrics & Gynecology

## 2013-08-11 ENCOUNTER — Encounter (HOSPITAL_COMMUNITY): Payer: Self-pay | Admitting: *Deleted

## 2013-08-11 ENCOUNTER — Encounter (HOSPITAL_COMMUNITY)
Admission: RE | Disposition: A | Discharge status: Home Health | Payer: Self-pay | Source: Ambulatory Visit | Attending: Orthopaedic Surgery

## 2013-08-11 DIAGNOSIS — Z79899 Other long term (current) drug therapy: Secondary | ICD-10-CM

## 2013-08-11 DIAGNOSIS — F329 Major depressive disorder, single episode, unspecified: Secondary | ICD-10-CM | POA: Diagnosis present

## 2013-08-11 DIAGNOSIS — Y831 Surgical operation with implant of artificial internal device as the cause of abnormal reaction of the patient, or of later complication, without mention of misadventure at the time of the procedure: Secondary | ICD-10-CM | POA: Diagnosis present

## 2013-08-11 DIAGNOSIS — D62 Acute posthemorrhagic anemia: Secondary | ICD-10-CM | POA: Diagnosis not present

## 2013-08-11 DIAGNOSIS — T84099A Other mechanical complication of unspecified internal joint prosthesis, initial encounter: Principal | ICD-10-CM | POA: Diagnosis present

## 2013-08-11 DIAGNOSIS — M6580 Other synovitis and tenosynovitis, unspecified site: Secondary | ICD-10-CM | POA: Diagnosis present

## 2013-08-11 DIAGNOSIS — T84018A Broken internal joint prosthesis, other site, initial encounter: Secondary | ICD-10-CM

## 2013-08-11 DIAGNOSIS — I10 Essential (primary) hypertension: Secondary | ICD-10-CM | POA: Diagnosis present

## 2013-08-11 DIAGNOSIS — F3289 Other specified depressive episodes: Secondary | ICD-10-CM | POA: Diagnosis present

## 2013-08-11 DIAGNOSIS — Z96659 Presence of unspecified artificial knee joint: Secondary | ICD-10-CM

## 2013-08-11 DIAGNOSIS — K219 Gastro-esophageal reflux disease without esophagitis: Secondary | ICD-10-CM | POA: Diagnosis present

## 2013-08-11 HISTORY — PX: TOTAL KNEE REVISION: SHX996

## 2013-08-11 SURGERY — TOTAL KNEE REVISION
Anesthesia: Spinal | Site: Knee | Laterality: Right | Wound class: Clean

## 2013-08-11 MED ORDER — OXYCODONE HCL 5 MG PO TABS
5.0000 mg | ORAL_TABLET | ORAL | Status: DC | PRN
  Administered 2013-08-11 (×3): 5 mg via ORAL
  Administered 2013-08-12 (×3): 10 mg via ORAL
  Administered 2013-08-12: 5 mg via ORAL
  Administered 2013-08-13 – 2013-08-14 (×5): 10 mg via ORAL
  Filled 2013-08-11 (×3): qty 2
  Filled 2013-08-11: qty 1
  Filled 2013-08-11: qty 2
  Filled 2013-08-11 (×2): qty 1
  Filled 2013-08-11 (×4): qty 2
  Filled 2013-08-11: qty 1
  Filled 2013-08-11: qty 2

## 2013-08-11 MED ORDER — SCOPOLAMINE 1 MG/3DAYS TD PT72
1.0000 | MEDICATED_PATCH | Freq: Once | TRANSDERMAL | Status: DC
  Filled 2013-08-11: qty 1

## 2013-08-11 MED ORDER — ONDANSETRON HCL 4 MG/2ML IJ SOLN: 4.0000 mg | Freq: Three times a day (TID) | INTRAMUSCULAR | Status: DC | PRN

## 2013-08-11 MED ORDER — ASPIRIN EC 325 MG PO TBEC
325.0000 mg | DELAYED_RELEASE_TABLET | Freq: Two times a day (BID) | ORAL | Status: DC
  Administered 2013-08-12 – 2013-08-14 (×5): 325 mg via ORAL
  Filled 2013-08-11 (×7): qty 1

## 2013-08-11 MED ORDER — SODIUM CHLORIDE 0.9 % IV BOLUS (SEPSIS)
500.0000 mL | Freq: Once | INTRAVENOUS | Status: AC
  Administered 2013-08-11: 500 mL via INTRAVENOUS

## 2013-08-11 MED ORDER — NALBUPHINE HCL 10 MG/ML IJ SOLN
5.0000 mg | INTRAMUSCULAR | Status: DC | PRN
  Filled 2013-08-11: qty 1

## 2013-08-11 MED ORDER — KETAMINE HCL 10 MG/ML IJ SOLN
INTRAMUSCULAR | Status: DC | PRN
  Administered 2013-08-11 (×2): 10 mg via INTRAVENOUS
  Administered 2013-08-11: 5 mg via INTRAVENOUS
  Administered 2013-08-11 (×4): 10 mg via INTRAVENOUS
  Administered 2013-08-11: 5 mg via INTRAVENOUS
  Administered 2013-08-11 (×3): 10 mg via INTRAVENOUS

## 2013-08-11 MED ORDER — MIDAZOLAM HCL 5 MG/5ML IJ SOLN
INTRAMUSCULAR | Status: DC | PRN
  Administered 2013-08-11: 1 mg via INTRAVENOUS
  Administered 2013-08-11: 2 mg via INTRAVENOUS
  Administered 2013-08-11: 1 mg via INTRAVENOUS

## 2013-08-11 MED ORDER — CEFAZOLIN SODIUM 1-5 GM-% IV SOLN
1.0000 g | Freq: Four times a day (QID) | INTRAVENOUS | Status: AC
  Administered 2013-08-11 (×2): 1 g via INTRAVENOUS
  Filled 2013-08-11 (×2): qty 50

## 2013-08-11 MED ORDER — METHOCARBAMOL 100 MG/ML IJ SOLN
500.0000 mg | Freq: Four times a day (QID) | INTRAVENOUS | Status: DC | PRN
  Administered 2013-08-11: 500 mg via INTRAVENOUS
  Filled 2013-08-11: qty 5

## 2013-08-11 MED ORDER — MEPERIDINE HCL 50 MG/ML IJ SOLN: 6.2500 mg | INTRAMUSCULAR | Status: DC | PRN

## 2013-08-11 MED ORDER — PHENOL 1.4 % MT LIQD: 1.0000 | OROMUCOSAL | Status: DC | PRN

## 2013-08-11 MED ORDER — SERTRALINE HCL 100 MG PO TABS
200.0000 mg | ORAL_TABLET | Freq: Every morning | ORAL | Status: DC
  Administered 2013-08-11 – 2013-08-14 (×4): 200 mg via ORAL
  Filled 2013-08-11 (×4): qty 2

## 2013-08-11 MED ORDER — DIPHENHYDRAMINE HCL 50 MG/ML IJ SOLN
12.5000 mg | INTRAMUSCULAR | Status: DC | PRN
  Administered 2013-08-11: 12.5 mg via INTRAVENOUS
  Filled 2013-08-11: qty 1

## 2013-08-11 MED ORDER — PHENYLEPHRINE HCL 10 MG/ML IJ SOLN
INTRAMUSCULAR | Status: DC | PRN
  Administered 2013-08-11 (×3): 80 ug via INTRAVENOUS

## 2013-08-11 MED ORDER — HYDROMORPHONE HCL PF 1 MG/ML IJ SOLN
INTRAMUSCULAR | Status: AC
  Filled 2013-08-11: qty 1

## 2013-08-11 MED ORDER — DOCUSATE SODIUM 100 MG PO CAPS
100.0000 mg | ORAL_CAPSULE | Freq: Two times a day (BID) | ORAL | Status: DC
  Administered 2013-08-11 – 2013-08-14 (×7): 100 mg via ORAL

## 2013-08-11 MED ORDER — ONDANSETRON HCL 4 MG/2ML IJ SOLN: 4.0000 mg | Freq: Four times a day (QID) | INTRAMUSCULAR | Status: DC | PRN

## 2013-08-11 MED ORDER — LOSARTAN POTASSIUM 50 MG PO TABS
50.0000 mg | ORAL_TABLET | Freq: Every day | ORAL | Status: DC
  Administered 2013-08-12 – 2013-08-14 (×3): 50 mg via ORAL
  Filled 2013-08-11 (×4): qty 1

## 2013-08-11 MED ORDER — METOCLOPRAMIDE HCL 10 MG PO TABS: 5.0000 mg | ORAL_TABLET | Freq: Three times a day (TID) | ORAL | Status: DC | PRN

## 2013-08-11 MED ORDER — PHENYLEPHRINE HCL 10 MG/ML IJ SOLN
10.0000 mg | INTRAVENOUS | Status: DC | PRN
  Administered 2013-08-11: 10 ug/min via INTRAVENOUS

## 2013-08-11 MED ORDER — SODIUM CHLORIDE 0.9 % IV SOLN
INTRAVENOUS | Status: DC
  Administered 2013-08-11 – 2013-08-12 (×2): via INTRAVENOUS

## 2013-08-11 MED ORDER — METOCLOPRAMIDE HCL 5 MG/ML IJ SOLN: 5.0000 mg | Freq: Three times a day (TID) | INTRAMUSCULAR | Status: DC | PRN

## 2013-08-11 MED ORDER — METHOCARBAMOL 500 MG PO TABS
500.0000 mg | ORAL_TABLET | Freq: Four times a day (QID) | ORAL | Status: DC | PRN
  Administered 2013-08-11 – 2013-08-14 (×8): 500 mg via ORAL
  Filled 2013-08-11 (×10): qty 1

## 2013-08-11 MED ORDER — NALOXONE HCL 0.4 MG/ML IJ SOLN: 0.4000 mg | INTRAMUSCULAR | Status: DC | PRN

## 2013-08-11 MED ORDER — METOCLOPRAMIDE HCL 5 MG/ML IJ SOLN: 10.0000 mg | Freq: Three times a day (TID) | INTRAMUSCULAR | Status: DC | PRN

## 2013-08-11 MED ORDER — DIPHENHYDRAMINE HCL 25 MG PO CAPS
25.0000 mg | ORAL_CAPSULE | ORAL | Status: DC | PRN
  Administered 2013-08-12 – 2013-08-13 (×2): 25 mg via ORAL
  Filled 2013-08-11 (×3): qty 1

## 2013-08-11 MED ORDER — 0.9 % SODIUM CHLORIDE (POUR BTL) OPTIME
TOPICAL | Status: DC | PRN
  Administered 2013-08-11: 1000 mL

## 2013-08-11 MED ORDER — BUPIVACAINE IN DEXTROSE 0.75-8.25 % IT SOLN
INTRATHECAL | Status: DC | PRN
  Administered 2013-08-11: 15 mg via INTRATHECAL

## 2013-08-11 MED ORDER — BUPROPION HCL ER (XL) 150 MG PO TB24
150.0000 mg | ORAL_TABLET | Freq: Every morning | ORAL | Status: DC
  Administered 2013-08-11 – 2013-08-14 (×4): 150 mg via ORAL
  Filled 2013-08-11 (×4): qty 1

## 2013-08-11 MED ORDER — ONDANSETRON HCL 4 MG PO TABS
4.0000 mg | ORAL_TABLET | Freq: Four times a day (QID) | ORAL | Status: DC | PRN
  Administered 2013-08-14: 4 mg via ORAL
  Filled 2013-08-11: qty 1

## 2013-08-11 MED ORDER — PROPOFOL INFUSION 10 MG/ML OPTIME
INTRAVENOUS | Status: DC | PRN
  Administered 2013-08-11: 100 ug/kg/min via INTRAVENOUS

## 2013-08-11 MED ORDER — MENTHOL 3 MG MT LOZG: 1.0000 | LOZENGE | OROMUCOSAL | Status: DC | PRN

## 2013-08-11 MED ORDER — FENTANYL CITRATE 0.05 MG/ML IJ SOLN
INTRAMUSCULAR | Status: DC | PRN
  Administered 2013-08-11 (×3): 50 ug via INTRAVENOUS

## 2013-08-11 MED ORDER — POLYETHYLENE GLYCOL 3350 17 G PO PACK
17.0000 g | PACK | Freq: Every day | ORAL | Status: DC | PRN
  Administered 2013-08-12 – 2013-08-13 (×2): 17 g via ORAL

## 2013-08-11 MED ORDER — ALPRAZOLAM 0.25 MG PO TABS
0.2500 mg | ORAL_TABLET | Freq: Every day | ORAL | Status: DC | PRN
  Filled 2013-08-11: qty 1

## 2013-08-11 MED ORDER — DIPHENHYDRAMINE HCL 50 MG/ML IJ SOLN: 25.0000 mg | INTRAMUSCULAR | Status: DC | PRN

## 2013-08-11 MED ORDER — ACETAMINOPHEN 325 MG PO TABS: 650.0000 mg | ORAL_TABLET | Freq: Four times a day (QID) | ORAL | Status: DC | PRN

## 2013-08-11 MED ORDER — CEFAZOLIN SODIUM-DEXTROSE 2-3 GM-% IV SOLR
INTRAVENOUS | Status: AC
  Filled 2013-08-11: qty 50

## 2013-08-11 MED ORDER — ACETAMINOPHEN 650 MG RE SUPP: 650.0000 mg | Freq: Four times a day (QID) | RECTAL | Status: DC | PRN

## 2013-08-11 MED ORDER — SODIUM CHLORIDE 0.9 % IJ SOLN: 3.0000 mL | INTRAMUSCULAR | Status: DC | PRN

## 2013-08-11 MED ORDER — PROMETHAZINE HCL 25 MG/ML IJ SOLN: 6.2500 mg | INTRAMUSCULAR | Status: DC | PRN

## 2013-08-11 MED ORDER — ALUM & MAG HYDROXIDE-SIMETH 200-200-20 MG/5ML PO SUSP: 30.0000 mL | ORAL | Status: DC | PRN

## 2013-08-11 MED ORDER — CEFAZOLIN SODIUM-DEXTROSE 2-3 GM-% IV SOLR
2.0000 g | INTRAVENOUS | Status: AC
  Administered 2013-08-11: 2 g via INTRAVENOUS

## 2013-08-11 MED ORDER — LIDOCAINE HCL (PF) 2 % IJ SOLN
INTRAMUSCULAR | Status: DC | PRN
  Administered 2013-08-11 (×12): 20 mg

## 2013-08-11 MED ORDER — OXYCODONE HCL ER 20 MG PO T12A
20.0000 mg | EXTENDED_RELEASE_TABLET | Freq: Two times a day (BID) | ORAL | Status: DC
  Administered 2013-08-11 – 2013-08-14 (×7): 20 mg via ORAL
  Filled 2013-08-11 (×7): qty 1

## 2013-08-11 MED ORDER — MORPHINE SULFATE 0.5 MG/ML IJ SOLN
INTRAMUSCULAR | Status: AC
  Filled 2013-08-11: qty 10

## 2013-08-11 MED ORDER — LACTATED RINGERS IV SOLN
INTRAVENOUS | Status: DC | PRN
  Administered 2013-08-11 (×4): via INTRAVENOUS

## 2013-08-11 MED ORDER — LOSARTAN POTASSIUM-HCTZ 50-12.5 MG PO TABS: 1.0000 | ORAL_TABLET | Freq: Every morning | ORAL | Status: DC

## 2013-08-11 MED ORDER — ZOLPIDEM TARTRATE 5 MG PO TABS
5.0000 mg | ORAL_TABLET | Freq: Every evening | ORAL | Status: DC | PRN
  Administered 2013-08-13: 5 mg via ORAL
  Filled 2013-08-11: qty 1

## 2013-08-11 MED ORDER — DIPHENHYDRAMINE HCL 12.5 MG/5ML PO ELIX: 12.5000 mg | ORAL_SOLUTION | ORAL | Status: DC | PRN

## 2013-08-11 MED ORDER — NALOXONE HCL 1 MG/ML IJ SOLN
1.0000 ug/kg/h | INTRAVENOUS | Status: DC | PRN
  Filled 2013-08-11: qty 2

## 2013-08-11 MED ORDER — HYDROMORPHONE HCL PF 1 MG/ML IJ SOLN
1.0000 mg | INTRAMUSCULAR | Status: DC | PRN
  Administered 2013-08-11 – 2013-08-12 (×2): 1 mg via INTRAVENOUS
  Filled 2013-08-11 (×2): qty 1

## 2013-08-11 MED ORDER — SODIUM CHLORIDE 0.9 % IR SOLN
Status: DC | PRN
  Administered 2013-08-11: 3000 mL

## 2013-08-11 MED ORDER — HYDROMORPHONE HCL PF 1 MG/ML IJ SOLN
INTRAMUSCULAR | Status: DC | PRN
  Administered 2013-08-11: 2 mg via INTRAMUSCULAR

## 2013-08-11 MED ORDER — KETOROLAC TROMETHAMINE 30 MG/ML IJ SOLN
INTRAMUSCULAR | Status: DC | PRN
  Administered 2013-08-11: 30 mg via INTRAVENOUS

## 2013-08-11 MED ORDER — HYDROCHLOROTHIAZIDE 12.5 MG PO CAPS
12.5000 mg | ORAL_CAPSULE | Freq: Every day | ORAL | Status: DC
  Administered 2013-08-12 – 2013-08-14 (×3): 12.5 mg via ORAL
  Filled 2013-08-11 (×4): qty 1

## 2013-08-11 MED ORDER — HYDROMORPHONE HCL PF 1 MG/ML IJ SOLN
0.2500 mg | INTRAMUSCULAR | Status: DC | PRN
  Administered 2013-08-11: 0.5 mg via INTRAVENOUS

## 2013-08-11 SURGICAL SUPPLY — 58 items
BAG SPEC THK2 15X12 ZIP CLS (MISCELLANEOUS) ×3
BAG ZIPLOCK 12X15 (MISCELLANEOUS) ×6 IMPLANT
BANDAGE ELASTIC 4 VELCRO ST LF (GAUZE/BANDAGES/DRESSINGS) ×2 IMPLANT
BANDAGE ELASTIC 6 VELCRO ST LF (GAUZE/BANDAGES/DRESSINGS) ×2 IMPLANT
BANDAGE ESMARK 6X9 LF (GAUZE/BANDAGES/DRESSINGS) ×1 IMPLANT
BLADE SAG 13.0X1.37X90 (BLADE) ×2 IMPLANT
BLADE SAW SGTL 81X20 HD (BLADE) ×2 IMPLANT
BNDG ESMARK 6X9 LF (GAUZE/BANDAGES/DRESSINGS) ×2
CEMENT BONE 1-PACK (Cement) ×8 IMPLANT
COUPLER LEGION OFFSET 4MM KNEE (Orthopedic Implant) ×2 IMPLANT
COUPLER LEGION OFFSET 6MM KNEE (Orthopedic Implant) ×2 IMPLANT
COUPLER OFFSET 4MM (INSTRUMENTS) ×2 IMPLANT
CUFF TOURN SGL QUICK 34 (TOURNIQUET CUFF) ×2
CUFF TRNQT CYL 34X4X40X1 (TOURNIQUET CUFF) ×1 IMPLANT
DRAPE EXTREMITY T 121X128X90 (DRAPE) ×2 IMPLANT
DRAPE POUCH INSTRU U-SHP 10X18 (DRAPES) ×2 IMPLANT
DRAPE U-SHAPE 47X51 STRL (DRAPES) ×2 IMPLANT
DRSG PAD ABDOMINAL 8X10 ST (GAUZE/BANDAGES/DRESSINGS) ×4 IMPLANT
DURAPREP 26ML APPLICATOR (WOUND CARE) ×2 IMPLANT
ELECT REM PT RETURN 9FT ADLT (ELECTROSURGICAL) ×2
ELECTRODE REM PT RTRN 9FT ADLT (ELECTROSURGICAL) ×1 IMPLANT
EVACUATOR 1/8 PVC DRAIN (DRAIN) ×2 IMPLANT
FACESHIELD LNG OPTICON STERILE (SAFETY) ×10 IMPLANT
FEMUR OXINIUM SZ 4 KNEE (Femur) ×2 IMPLANT
GAUZE XEROFORM 5X9 LF (GAUZE/BANDAGES/DRESSINGS) ×2 IMPLANT
GLOVE BIO SURGEON STRL SZ7.5 (GLOVE) ×2 IMPLANT
GLOVE BIOGEL PI IND STRL 8 (GLOVE) ×2 IMPLANT
GLOVE BIOGEL PI INDICATOR 8 (GLOVE) ×2
GLOVE ECLIPSE 8.0 STRL XLNG CF (GLOVE) ×2 IMPLANT
GOWN STRL REIN XL XLG (GOWN DISPOSABLE) ×8 IMPLANT
HANDPIECE INTERPULSE COAX TIP (DISPOSABLE) ×2
IMMOBILIZER KNEE 20 (SOFTGOODS) ×2
IMMOBILIZER KNEE 20 THIGH 36 (SOFTGOODS) ×1 IMPLANT
INSERT XLPE 11MM SZ 3-4 (Knees) ×2 IMPLANT
KIT BASIN OR (CUSTOM PROCEDURE TRAY) ×2 IMPLANT
NS IRRIG 1000ML POUR BTL (IV SOLUTION) ×2 IMPLANT
PACK TOTAL JOINT (CUSTOM PROCEDURE TRAY) ×2 IMPLANT
PADDING CAST COTTON 6X4 STRL (CAST SUPPLIES) ×4 IMPLANT
PIN TROCAR 3 INCH (PIN) ×8 IMPLANT
PIN TROCAR 5 (PIN) ×8 IMPLANT
POSITIONER SURGICAL ARM (MISCELLANEOUS) ×2 IMPLANT
RESTRICTOR CEMENT PE SZ 2 (Cement) ×4 IMPLANT
SET HNDPC FAN SPRY TIP SCT (DISPOSABLE) ×1 IMPLANT
SET PAD KNEE POSITIONER (MISCELLANEOUS) ×2 IMPLANT
SPONGE GAUZE 4X4 12PLY (GAUZE/BANDAGES/DRESSINGS) ×2 IMPLANT
SPONGE LAP 18X18 X RAY DECT (DISPOSABLE) ×4 IMPLANT
STAPLER VISISTAT 35W (STAPLE) ×2 IMPLANT
STEM STRAIGHE 12MX120M KNEE (Stem) ×2 IMPLANT
STEM STRAIGHT 12X160MM (Stem) ×2 IMPLANT
SUCTION FRAZIER 12FR DISP (SUCTIONS) ×2 IMPLANT
SUT VIC AB 0 CT1 36 (SUTURE) ×4 IMPLANT
SUT VIC AB 1 CT1 36 (SUTURE) ×6 IMPLANT
SUT VIC AB 2-0 CT1 27 (SUTURE) ×6
SUT VIC AB 2-0 CT1 TAPERPNT 27 (SUTURE) ×3 IMPLANT
TOWEL OR 17X26 10 PK STRL BLUE (TOWEL DISPOSABLE) ×4 IMPLANT
TRAY FOLEY CATH 14FRSI W/METER (CATHETERS) ×2 IMPLANT
WATER STERILE IRR 1500ML POUR (IV SOLUTION) ×4 IMPLANT
WEDGE 5MM SZ 3-4 KNEE (Knees) ×4 IMPLANT

## 2013-08-11 NOTE — Transfer of Care (Signed)
Immediate Anesthesia Transfer of Care Note  Patient: Deanna Rodgers  Procedure(s) Performed: Procedure(s): REVISION RIGHT TOTAL KNEE ARTHROPLASTY (Right)  Patient Location: PACU  Anesthesia Type:Spinal  Level of Consciousness: awake  Airway & Oxygen Therapy: Patient Spontanous Breathing and Patient connected to face mask oxygen  Post-op Assessment: Report given to PACU RN and Post -op Vital signs reviewed and stable  Post vital signs: Reviewed and stable  Complications: No apparent anesthesia complications

## 2013-08-11 NOTE — Anesthesia Procedure Notes (Signed)

## 2013-08-11 NOTE — Plan of Care (Signed)
Problem: Consults Goal: Diagnosis- Total Joint Replacement Revision Total Knee     

## 2013-08-11 NOTE — Anesthesia Postprocedure Evaluation (Signed)
  Anesthesia Post-op Note  Patient: Deanna Rodgers  Procedure(s) Performed: Procedure(s) (LRB): REVISION RIGHT TOTAL KNEE ARTHROPLASTY (Right)  Patient Location: PACU  Anesthesia Type: Spinal  Level of Consciousness: awake and alert   Airway and Oxygen Therapy: Patient Spontanous Breathing  Post-op Pain: mild  Post-op Assessment: Post-op Vital signs reviewed, Patient's Cardiovascular Status Stable, Respiratory Function Stable, Patent Airway and No signs of Nausea or vomiting  Last Vitals:  Filed Vitals:   08/11/13 1215  BP: 96/54  Pulse: 117  Temp: 37.1 C  Resp: 16    Post-op Vital Signs: stable   Complications: No apparent anesthesia complications

## 2013-08-11 NOTE — Preoperative (Signed)
Beta Blockers   Reason not to administer Beta Blockers:Not Applicable 

## 2013-08-11 NOTE — Brief Op Note (Signed)
08/11/2013  10:51 AM  PATIENT:  Deanna Rodgers  47 y.o. female  PRE-OPERATIVE DIAGNOSIS:  Failed right total knee arthroplasty  POST-OPERATIVE DIAGNOSIS:  Failed right total knee arthroplasty  PROCEDURE:  Procedure(s): REVISION RIGHT TOTAL KNEE ARTHROPLASTY (Right)  SURGEON:  Surgeon(s) and Role:    * [name], MD - Primary  PHYSICIAN ASSISTANT: Rexene Edison, PA-C  ANESTHESIA:   spinal  EBL:  Total I/O In: 2000 [I.V.:2000] Out: 1100 [Urine:700; Blood:400]  BLOOD ADMINISTERED:none  DRAINS: (medium) Hemovact drain(s) in the knee joint with  Suction Open   LOCAL MEDICATIONS USED:  NONE  SPECIMEN:  No Specimen  DISPOSITION OF SPECIMEN:  N/A  COUNTS:  YES  TOURNIQUET:   Total Tourniquet Time Documented: Thigh (Right) - 116 minutes Total: Thigh (Right) - 116 minutes   DICTATION: .Other Dictation: Dictation Number [id number]  PLAN OF CARE: Admit to inpatient   PATIENT DISPOSITION:  PACU - hemodynamically stable.   Delay start of Pharmacological VTE agent (>24hrs) due to surgical blood loss or risk of bleeding: no

## 2013-08-11 NOTE — H&P (Signed)
TOTAL KNEE REVISION ADMISSION H&P  Patient is being admitted for right revision total knee arthroplasty.  Subjective:  Chief Complaint:right knee pain.  HPI: Deanna Rodgers, 47 y.o. female, has a history of pain and functional disability in the right knee(s) due to failed previous arthroplasty and patient has failed non-surgical conservative treatments for greater than 12 weeks to include NSAID's and/or analgesics and activity modification. The indications for the revision of the total knee arthroplasty are recuurent effusions worrisome for metal allergy. Onset of symptoms was gradual starting 1 years ago with gradually worsening course since that time.  Prior procedures on the right knee(s) include arthroscopy and synovectomy with poly exchange.  Patient currently rates pain in the right knee(s) at 7 out of 10 with activity. There is worsening of pain with activity and weight bearing, pain that interferes with activities of daily living and joint swelling.  There is no current active infection.  Patient Active Problem List   Diagnosis Date Noted  . 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 Painful total knee replacement 04/14/2013  . Synovitis of knee 12/23/2012  . Hypertension 10/16/2012  . Overdose 10/15/2012  . Degenerative arthritis of knee 12/25/2011  . HEMORRHOIDS-INTERNAL 10/14/2010  . HEMORRHOIDS-EXTERNAL 10/14/2010  . GERD 10/14/2010  . ULCER-DUODENAL 10/14/2010  . Irritable bowel syndrome 10/14/2010  . ACUT DUODEN ULCER W/PERF W/O MENTION OBSTRUCTION 10/28/2009  . IRON DEFICIENCY 08/08/2009  . DYSPEPSIA&OTHER SPEC DISORDERS FUNCTION STOMACH 08/08/2009  . RECTAL BLEEDING 08/08/2009   Past Medical History  Diagnosis Date  . Depression   . History of duodenal ulcer   . History of gastroesophageal reflux (GERD)   . Anemia   . Abnormal Pap smear     bx  . Hypertension   . Arthritis     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 -SEVERAL RT KNEE PROCEDURES  SINCE BECAUSE OF PAIN AND SWELLING IN THE KNEE  . Pain     LOWER BACK     Past Surgical History  Procedure Laterality Date  . Cesarean section  12-24-11    '85- x1  . Knee arthroscopy  12-24-11    x2 -last 12'12([facility]  . Tubal ligation    . Knee arthroplasty  12/25/2011    Procedure: COMPUTER ASSISTED TOTAL KNEE ARTHROPLASTY;  Surgeon: [name], MD;  Location: [facility];  Service: Orthopedics;  Laterality: Right;  . Joint replacement  12/2011    rt total knee  . Knee arthroscopy Right 12/23/2012    Procedure: Right Knee Arthroscopy with minimal Debridement ;  Surgeon: [name], MD;  Location: [facility];  Service: Orthopedics;  Laterality: Right;  Right Knee Arthroscopy with minimal  Debridement   . Hemorroidectomy  yrs ago  . Total knee revision Right 04/14/2013    Procedure: Synovectomy right total knee with poly exchange;  Surgeon: [name], MD;  Location: [facility];  Service: Orthopedics;  Laterality: Right;    Prescriptions prior to admission  Medication Sig Dispense Refill  . ALPRAZolam (XANAX) 0.25 MG tablet Take 0.25 mg by mouth daily as needed for anxiety.       Marland Kitchen buPROPion (WELLBUTRIN XL) 150 MG 24 hr tablet Take 150 mg by mouth every morning.      Marland Kitchen losartan-hydrochlorothiazide (HYZAAR) 50-12.5 MG per tablet Take 1 tablet by mouth every morning.      . meloxicam (MOBIC) 7.5 MG tablet Take 7.5 mg by mouth 2 (two) times daily.      Marland Kitchen  sertraline (ZOLOFT) 100 MG tablet Take 200 mg by mouth every morning.      . traZODone (DESYREL) 100 MG tablet Take 200 mg by mouth at bedtime.       Marland Kitchen OVER THE COUNTER MEDICATION OTC IRON PILLS ONCE A DAY       No Known Allergies  History  Substance Use Topics  . Smoking status: Never Smoker   . Smokeless tobacco: Never Used  . Alcohol Use: No    Family History  Problem Relation Age of Onset  . Hypertension Father   . Stroke Father   . Hypertension Maternal Aunt   . Cancer Maternal Uncle   .  Cancer Paternal Uncle   . Hypertension Maternal Grandmother   . Stroke Maternal Grandmother       Review of Systems  All other systems reviewed and are negative.     Objective:  Physical Exam  Constitutional: She is oriented to person, place, and time. She appears well-developed and well-nourished.  HENT:  Head: Normocephalic and atraumatic.  Eyes: EOM are normal. Pupils are equal, round, and reactive to light.  Neck: Normal range of motion. Neck supple.  Cardiovascular: Normal rate and regular rhythm.   Respiratory: Effort normal and breath sounds normal.  GI: Soft. Bowel sounds are normal.  Musculoskeletal:       Right knee: She exhibits effusion. Tenderness found.  Neurological: She is alert and oriented to person, place, and time.  Skin: Skin is warm and dry.  Psychiatric: She has a normal mood and affect.    Vital signs in last 24 hours: Temp:  [98.6 F (37 C)] 98.6 F (37 C) (10/10 0545) Pulse Rate:  [116] 116 (10/10 0545) Resp:  [18] 18 (10/10 0545) BP: (130)/(89) 130/89 mmHg (10/10 0545) SpO2:  [100 %] 100 % (10/10 0545)  Labs:  Estimated body mass index is 30.51 kg/(m^2) as calculated from the following:   Height as of 08/02/13: 5\' 3"  (1.6 m).   Weight as of 07/14/13: 78.109 kg (172 lb 3.2 oz).  Imaging Review Plain radiographs demonstrate well-seated implants knee(s). The overall alignment is neutral.There is no evidence of loosening of the components. The bone quality appears to be excellent for age and reported activity level. Assessment/Plan:  End stage arthritis, right knee(s) with failed previous arthroplasty.   The patient history, physical examination, clinical judgment of the provider and imaging studies are consistent with end stage degenerative joint disease of the right knee(s), previous total knee arthroplasty. Revision total knee arthroplasty is deemed medically necessary. The treatment options including medical management, injection therapy,  arthroscopy and revision arthroplasty were discussed at length. The risks and benefits of revision total knee arthroplasty were presented and reviewed. The risks due to aseptic loosening, infection, stiffness, patella tracking problems, thromboembolic complications and other imponderables were discussed. The patient acknowledged the explanation, agreed to proceed with the plan and consent was signed. Patient is being admitted for inpatient treatment for surgery, pain control, PT, OT, prophylactic antibiotics, VTE prophylaxis, progressive ambulation and ADL's and discharge planning.The patient is planning to be discharged home with home health services

## 2013-08-12 LAB — BASIC METABOLIC PANEL
BUN: 12 mg/dL (ref 6–23)
CO2: 25 mEq/L (ref 19–32)
Calcium: 7.7 mg/dL — ABNORMAL LOW (ref 8.4–10.5)
Chloride: 104 mEq/L (ref 96–112)
Creatinine, Ser: 0.65 mg/dL (ref 0.50–1.10)
GFR calc Af Amer: >90 mL/min (ref >90)
GFR calc non Af Amer: >90 mL/min (ref >90)
Glucose, Bld: 105 mg/dL — ABNORMAL HIGH (ref 70–99)
Potassium: 3.4 mEq/L — ABNORMAL LOW (ref 3.5–5.1)
Sodium: 134 mEq/L — ABNORMAL LOW (ref 135–145)

## 2013-08-12 LAB — CBC
HCT: 25.6 % — ABNORMAL LOW (ref 36.0–46.0)
Hemoglobin: 7.7 g/dL — ABNORMAL LOW (ref 12.0–15.0)
MCH: 20.3 pg — ABNORMAL LOW (ref 26.0–34.0)
MCHC: 30.1 g/dL (ref 30.0–36.0)
MCV: 67.5 fL — ABNORMAL LOW (ref 78.0–100.0)
Platelets: 136 10*3/uL — ABNORMAL LOW (ref 150–400)
RBC: 3.79 MIL/uL — ABNORMAL LOW (ref 3.87–5.11)
RDW: 15.5 % (ref 11.5–15.5)
WBC: 9.8 10*3/uL (ref 4.0–10.5)

## 2013-08-12 NOTE — Progress Notes (Signed)
Subjective: 1 Day Post-Op Procedure(s) (LRB): REVISION RIGHT TOTAL KNEE ARTHROPLASTY (Right) Patient reports pain as moderate.  Hgb down to 7.7 (acute blood loss anemia).  Stable vitals.  Objective: Vital signs in last 24 hours: Temp:  [97.9 F (36.6 C)-100.5 F (38.1 C)] 100.5 F (38.1 C) (10/11 0507) Pulse Rate:  [103-117] 116 (10/11 0507) Resp:  [16-20] 18 (10/11 0507) BP: (89-127)/(54-79) 127/79 mmHg (10/11 0507) SpO2:  [96 %-100 %] 99 % (10/11 0507) Weight:  [75.751 kg (167 lb)] 75.751 kg (167 lb) (10/10 1609)  Intake/Output from previous day: 10/10 0701 - 10/11 0700 In: 4250 [I.V.:4250] Out: 2275 [Urine:1230; Drains:645; Blood:400] Intake/Output this shift: Total I/O In: -  Out: 600 [Urine:600]   Recent Labs  08/12/13 0511  HGB 7.7*    Recent Labs  08/12/13 0511  WBC 9.8  RBC 3.79*  HCT 25.6*  PLT 136*    Recent Labs  08/12/13 0511  NA 134*  K 3.4*  CL 104  CO2 25  BUN 12  CREATININE 0.65  GLUCOSE 105*  CALCIUM 7.7*   No results found for this basename: LABPT, INR,  in the last 72 hours  Sensation intact distally Intact pulses distally Dorsiflexion/Plantar flexion intact Incision: dressing C/D/I Compartment soft  Assessment/Plan: 1 Day Post-Op Procedure(s) (LRB): REVISION RIGHT TOTAL KNEE ARTHROPLASTY (Right) Up with therapy Check CBC in am tomorrow.  Reylene Stauder Y 08/12/2013, 10:50 AM

## 2013-08-12 NOTE — Progress Notes (Signed)
Physical Therapy Treatment Patient Details Name: Deanna Rodgers MRN: [id number] DOB: [birth year] Today's Date: 08/12/2013 Time: 4259-5638 PT Time Calculation (min): 23 min  PT Assessment / Plan / Recommendation  History of Present Illness     PT Comments   Pt s/p revision of R TKR presents with decreased R LE strength/ROM and post op pain limiting functional mobility.  Pt should progress to d/c home with intermittent assist of family, home health aide and follow up HHPT.  Follow Up Recommendations  Home health PT     Does the patient have the potential to tolerate intense rehabilitation     Barriers to Discharge Decreased caregiver support      Equipment Recommendations  None recommended by PT    Recommendations for Other Services OT consult  Frequency 7X/week   Progress towards PT Goals    Plan      Precautions / Restrictions Precautions Precautions: Knee;Fall Required Braces or Orthoses: Knee Immobilizer - Right Knee Immobilizer - Right: Discontinue once straight leg raise with < 10 degree lag Restrictions Weight Bearing Restrictions: No   Pertinent Vitals/Pain 8/10 at rest and with activity; premed, cold packs provided    Mobility  Bed Mobility Bed Mobility: Supine to Sit Supine to Sit: 4: Min assist;3: Mod assist;With rails Details for Bed Mobility Assistance: for for use of L LE to self assist and for sequencing:  Assist with R LE management Transfers Transfers: Sit to Stand;Stand to Sit Sit to Stand: 4: Min assist;3: Mod assist Stand to Sit: 4: Min assist;3: Mod assist Details for Transfer Assistance: cues for LE management and use of UEs to self assist Ambulation/Gait Ambulation/Gait Assistance: 4: Min assist;3: Mod assist Ambulation Distance (Feet): 18 Feet Assistive device: Rolling walker Ambulation/Gait Assistance Details: cues for sequence, posture, position from RW and stride length Gait Pattern: Step-to pattern;Decreased step length - right;Decreased  step length - left;Shuffle;Trunk flexed    Exercises     PT Diagnosis: Difficulty walking  PT Problem List: Decreased strength;Decreased range of motion;Decreased activity tolerance;Decreased mobility;Decreased knowledge of use of DME;Obesity;Pain PT Treatment Interventions: DME instruction;Functional mobility training;Gait training;Therapeutic activities;Therapeutic exercise;Patient/family education   PT Goals (current goals can now be found in the care plan section) Acute Rehab PT Goals Patient Stated Goal: Walk without pain PT Goal Formulation: With patient Time For Goal Achievement: 08/19/13 Potential to Achieve Goals: Good  Visit Information  Last PT Received On: 08/12/13 Assistance Needed: +1    Subjective Data  Patient Stated Goal: Walk without pain   Cognition  Cognition Arousal/Alertness: Awake/alert Behavior During Therapy: WFL for tasks assessed/performed Overall Cognitive Status: Within Functional Limits for tasks assessed    Balance     End of Session PT - End of Session Equipment Utilized During Treatment: Gait belt;Right knee immobilizer Activity Tolerance: Patient tolerated treatment well;Patient limited by pain Patient left: in chair;with call bell/phone within reach;with family/visitor present Nurse Communication: Mobility status   GP     Jari Dipasquale 08/12/2013, 12:51 PM

## 2013-08-12 NOTE — Progress Notes (Signed)
   CARE MANAGEMENT NOTE 08/12/2013  Patient:  URIYAH, MASSIMO   Account Number:  [id number]  Date Initiated:  08/12/2013  Documentation initiated by:  [facility]  Subjective/Objective Assessment:   REVISION RIGHT TOTAL KNEE ARTHROPLASTY (Right)     Action/Plan:   Anticipated DC Date:  08/13/2013   Anticipated DC Plan:  HOME W HOME HEALTH SERVICES      DC Planning Services  CM consult      Choice offered to / List presented to:          [facility] arranged  HH-2 PT      [facility] agency  [facility]   Status of service:  Completed, signed off Medicare Important Message given?   (If response is "NO", the following Medicare IM given date fields will be blank) Date Medicare IM given:   Date Additional Medicare IM given:    Discharge Disposition:  HOME W HOME HEALTH SERVICES  Per UR Regulation:    If discussed at Long Length of Stay Meetings, dates discussed:    Comments:  08/12/2013 1020 NCM spoke to pt and states she has HH agency, [place].  Pt states she has all her DME at home (RW. BSC, and tub bench).  Daryl Eastern RN CCM Case Mgmt phone [phone]

## 2013-08-12 NOTE — Op Note (Signed)
Deanna Rodgers, Deanna NO.:  [id number]  MEDICAL RECORD NO.:  [id number]  LOCATION:  1613                         FACILITY:  [facility]  PHYSICIAN:  Vanita Panda. Magnus Ivan, M.D.DATE OF BIRTH:  16-Nov-1965  DATE OF PROCEDURE:  08/11/2013 DATE OF DISCHARGE:                              OPERATIVE REPORT   PREOPERATIVE DIAGNOSIS:  Failed painful right total knee arthroplasty with recurrent effusions.  POSTOPERATIVE DIAGNOSIS:  Failed right total knee arthroplasty with recurrent effusions and synovitis, questionable metal allergy.  FINDINGS:  Stable components of right total knee with no evidence of infection, no evidence of loosening, and mild synovitis and effusion, questionable metal allergy.  PROCEDURE:  Revision arthroplasty of right total knee with removal of femoral and tibial components and placement of new revision total knee arthroplasty.  IMPLANTS: 1. Removal of Stryker triathlon knee. 2. Insertion of Smith and Nephew revision knee with size 4 OXINIUM     femur, femoral component with 6-mm offset and 12-mm x 160-mm     femoral component, our femoral stem, size 3 revision right tibial     baseplate with 5-mm tibial offset wedge, 12-mm x 120-mm stem, size     11 polyethylene high flexion insert.  SURGEON:  Vanita Panda. Magnus Ivan, M.D.  ASSISTANT:  Karsten Ro, PA-C  ANESTHESIA:  Spinal.  BLOOD LOSS:  Less than 200 mL.  TOURNIQUET TIME:  2 hours.  ANTIBIOTICS:  2 grams of IV Ancef.  COMPLICATIONS:  None.  INDICATIONS:  Deanna Rodgers is a 47 year old female who 2 years ago underwent a right total knee arthroplasty due to severe arthritis in her right knee.  The total knee arthroplasty has been painful to her the whole time.  She has head episodes of recurrent effusions of that knee. We have drained on multiple times and never find evidence of infection. Earlier, we took her to the operating room and performed an open synovectomy and we did not find  any loosening of the components and no evidence of infection.  We changed out her polyethylene liner.  She has continued to have recurrent pain and mild effusions to her knee.  At this point, we feel like this may be a metal allergy.  She wishes to have that knee revised to a different components altogether given her continued pain and swelling.  PROCEDURE DESCRIPTION:  After informed consent was obtained and appropriate right knee was marked, she was brought to the operating room and spinal anesthesia was obtained while she was on her stretcher.  She was then placed in a supine position.  A Foley catheter was placed and then a nonsterile tourniquet was placed around her upper right thigh. Her right leg was then prepped and draped with DuraPrep and sterile drapes and sterile stockinette as well.  Time-out was called and she was identified as correct patient and correct right knee.  We then used an Esmarch to wrap out the leg and tourniquet was inflated to 300 mm of pressure.  We made a midline incision over the previous incision, carried this proximally and distally and dissected down the knee joint and performed a medial parapatellar arthrotomy and found a mild effusion in the knee and synovitis.  There was no evidence of infection.  We then cleaned the scar tissue from the knee that we could and then taken considered amount of time to remove the femoral and tibial components and both of these were well seated with no evidence of loosening at all. We removed the tibial tray and the femoral component with minimal bone loss on the femur, but some bone loss on the tibia and again, no evidence of infection, and there was good cement and bone as well as cement and implant interface removed the poly as well.  We then proceeded with revision components parts of the case.  We sequentially drilled with reamers on the tibial side and that femoral side so we can get it to a size 12 mm tibia and a  12-mm femoral as far as the stems go. We then made our freshening tibial cut and then went to the femur and then put the four-in-one cutting block and freshened up the anterior and posterior femoral cuts as well as the chamfer cuts.  We trialed a size 4 femur and we made sure that we had this a nice fit on the femur.  We then made our box cut.  We then back to the tibia and then trial to the tibia again and placed a wedge on the keel cut for this as well.  Then with all the components in place, we trialed several different polyethylene inserts and felt that we need to build the tibia to a 5-mm augment wedge.  Once we did this and placed an 11-mm polyethylene insert, this gave her full range of motion and it was a stable knee throughout flexion and extension arc.  We then removed all trial components and let the tourniquet down.  We used pulsatile lavage 3 liters throughout the knee at different components during the case to thoroughly irrigated the knee.  We then cemented the Smith and Nephew tibial components with the stem after placing cement restrictor on the tibia.  We placed a cement restrictor on the femur and cemented the femoral component.  We then placed the real 11-mm polyethylene insert. Once the cement had dried, we placed a medium Hemovac to the wound and irrigated the tissues again and closed the joint capsule with interrupted #1 Ethibond suture followed by 0 Vicryl in the deep tissue and 2-0 Vicryl in the subcutaneous tissue, and staples on the skin. Xeroform and well-padded sterile dressing was applied.  She was taken to the recovery room in stable condition.  All final counts were correct and there were no complications noted.  Of note, Deanna Canal, PA-C's assistance was required throughout the case especially with retracting up and remove the components and placing the components as well.  He was essential in closing the wound.     Vanita Panda. Magnus Ivan,  M.D.     CYB/MEDQ  D:  08/11/2013  T:  08/12/2013  Job:  [id number]

## 2013-08-12 NOTE — Progress Notes (Signed)
OT Cancellation Note  Patient Details Name: Deanna Rodgers MRN: [id number] DOB: [birth year]   Cancelled Treatment:    Reason Eval/Treat Not Completed: Other (comment).Attempted x2 this am--pt needed pain meds 1st attempt and 2nd attempt pt falling asleep off and on while I was trying to talk to her so question retention of information. Will attempt later today or tomorrow.  Evette Georges [phone] 08/12/2013, 10:47 AM

## 2013-08-12 NOTE — Progress Notes (Signed)
Physical Therapy Treatment Patient Details Name: Deanna Rodgers MRN: [id number] DOB: 06-07-1966 Today's Date: 08/12/2013 Time: 4098-1191 PT Time Calculation (min): 13 min  PT Assessment / Plan / Recommendation  History of Present Illness     PT Comments   Pt declines to attempt ambulation further.  " I just want to go back to bed"  Follow Up Recommendations  Home health PT     Does the patient have the potential to tolerate intense rehabilitation     Barriers to Discharge        Equipment Recommendations  None recommended by PT    Recommendations for Other Services OT consult  Frequency 7X/week   Progress towards PT Goals Progress towards PT goals: Progressing toward goals  Plan Current plan remains appropriate    Precautions / Restrictions Precautions Precautions: Knee;Fall Required Braces or Orthoses: Knee Immobilizer - Right Knee Immobilizer - Right: Discontinue once straight leg raise with < 10 degree lag Restrictions Weight Bearing Restrictions: No   Pertinent Vitals/Pain "its all right"    Mobility  Bed Mobility Bed Mobility: Sit to Supine Sit to Supine: 4: Min assist Details for Bed Mobility Assistance: for for use of L LE to self assist and for sequencing:  Assist with R LE management Transfers Transfers: Sit to Stand;Stand to Sit Sit to Stand: 4: Min assist;From chair/3-in-1 Stand to Sit: 4: Min assist;To bed;With upper extremity assist Details for Transfer Assistance: cues for LE management and use of UEs to self assist Ambulation/Gait Ambulation/Gait Assistance: 4: Min assist Ambulation Distance (Feet): 6 Feet Assistive device: Rolling walker Ambulation/Gait Assistance Details: cues for sequencing and position from RW Gait Pattern: Step-to pattern;Decreased step length - right;Decreased step length - left;Shuffle;Trunk flexed    Exercises     PT Diagnosis:    PT Problem List:   PT Treatment Interventions:     PT Goals (current goals can now be  found in the care plan section) Acute Rehab PT Goals Patient Stated Goal: Walk without pain PT Goal Formulation: With patient Time For Goal Achievement: 08/19/13 Potential to Achieve Goals: Good  Visit Information  Last PT Received On: 08/12/13 Assistance Needed: +1    Subjective Data  Patient Stated Goal: Walk without pain   Cognition  Cognition Arousal/Alertness: Awake/alert Behavior During Therapy: WFL for tasks assessed/performed Overall Cognitive Status: Within Functional Limits for tasks assessed    Balance     End of Session PT - End of Session Equipment Utilized During Treatment: Gait belt;Right knee immobilizer Activity Tolerance: Patient limited by pain;Patient limited by fatigue Patient left: in bed;with call bell/phone within reach;with family/visitor present Nurse Communication: Mobility status CPM Right Knee CPM Right Knee: On   GP     Deanna Rodgers 08/12/2013, 4:30 PM

## 2013-08-13 LAB — CBC
HCT: 23.9 % — ABNORMAL LOW (ref 36.0–46.0)
Hemoglobin: 7.6 g/dL — ABNORMAL LOW (ref 12.0–15.0)
MCH: 21.1 pg — ABNORMAL LOW (ref 26.0–34.0)
MCHC: 31.8 g/dL (ref 30.0–36.0)
MCV: 66.2 fL — ABNORMAL LOW (ref 78.0–100.0)
Platelets: 121 10*3/uL — ABNORMAL LOW (ref 150–400)
RBC: 3.61 MIL/uL — ABNORMAL LOW (ref 3.87–5.11)
RDW: 15.2 % (ref 11.5–15.5)
WBC: 7.9 10*3/uL (ref 4.0–10.5)

## 2013-08-13 LAB — PREPARE RBC (CROSSMATCH)

## 2013-08-13 MED ORDER — ACETAMINOPHEN 325 MG PO TABS
650.0000 mg | ORAL_TABLET | Freq: Once | ORAL | Status: AC
  Administered 2013-08-13: 650 mg via ORAL
  Filled 2013-08-13: qty 2

## 2013-08-13 MED ORDER — FUROSEMIDE 10 MG/ML IJ SOLN
20.0000 mg | Freq: Once | INTRAMUSCULAR | Status: AC
  Administered 2013-08-13: 20 mg via INTRAVENOUS
  Filled 2013-08-13: qty 2

## 2013-08-13 MED ORDER — DIPHENHYDRAMINE HCL 25 MG PO CAPS
25.0000 mg | ORAL_CAPSULE | Freq: Once | ORAL | Status: AC
  Administered 2013-08-13: 25 mg via ORAL
  Filled 2013-08-13: qty 1

## 2013-08-13 MED ORDER — FERROUS SULFATE 325 (65 FE) MG PO TABS
325.0000 mg | ORAL_TABLET | Freq: Three times a day (TID) | ORAL | Status: DC
  Administered 2013-08-13 – 2013-08-14 (×3): 325 mg via ORAL
  Filled 2013-08-13 (×6): qty 1

## 2013-08-13 NOTE — Progress Notes (Signed)
Subjective: 2 Days Post-Op Procedure(s) (LRB): REVISION RIGHT TOTAL KNEE ARTHROPLASTY (Right) Patient reports pain as moderate.  Very slow mobility.  Symptomatic acute blood loss anemia.  Objective: Vital signs in last 24 hours: Temp:  [99.2 F (37.3 C)-100 F (37.8 C)] 99.2 F (37.3 C) (10/12 0607) Pulse Rate:  [109-117] 109 (10/12 0607) Resp:  [16-20] 16 (10/12 0607) BP: (94-120)/(63-79) 94/63 mmHg (10/12 0607) SpO2:  [95 %-98 %] 95 % (10/12 0607)  Intake/Output from previous day: 10/11 0701 - 10/12 0700 In: 1456.3 [P.O.:360; I.V.:1096.3] Out: 3100 [Urine:3100] Intake/Output this shift:     Recent Labs  08/12/13 0511 08/13/13 0513  HGB 7.7* 7.6*    Recent Labs  08/12/13 0511 08/13/13 0513  WBC 9.8 7.9  RBC 3.79* 3.61*  HCT 25.6* 23.9*  PLT 136* 121*    Recent Labs  08/12/13 0511  NA 134*  K 3.4*  CL 104  CO2 25  BUN 12  CREATININE 0.65  GLUCOSE 105*  CALCIUM 7.7*   No results found for this basename: LABPT, INR,  in the last 72 hours  Sensation intact distally Intact pulses distally Dorsiflexion/Plantar flexion intact Incision: scant drainage No cellulitis present Compartment soft  Assessment/Plan: 2 Days Post-Op Procedure(s) (LRB): REVISION RIGHT TOTAL KNEE ARTHROPLASTY (Right) Up with therapy Hopefully d/c to home tomorrow vs Tuesday. Will transfuse 1 unit of blood today.  BLACKMAN,CHRISTOPHER Y 08/13/2013, 9:55 AM

## 2013-08-13 NOTE — Progress Notes (Signed)
Physical Therapy Treatment Patient Details Name: Deanna Rodgers MRN: [id number] DOB: 1965-11-17 Today's Date: 08/13/2013 Time: 6578-4696 PT Time Calculation (min): 24 min  PT Assessment / Plan / Recommendation  History of Present Illness R TKA revsion   PT Comments   Progressing with ambulation tolerance and sitting knee ROM.  Follow Up Recommendations  Home health PT     Does the patient have the potential to tolerate intense rehabilitation   N/A  Barriers to Discharge  None      Equipment Recommendations  None recommended by PT    Recommendations for Other Services  None  Frequency 7X/week   Progress towards PT Goals Progress towards PT goals: Progressing toward goals  Plan Current plan remains appropriate    Precautions / Restrictions Precautions Precautions: Knee;Fall Required Braces or Orthoses: Knee Immobilizer - Right Knee Immobilizer - Right: Discontinue once straight leg raise with < 10 degree lag   Pertinent Vitals/Pain No complaints with ambulation    Mobility  Bed Mobility Supine to Sit: 4: Min assist Sit to Supine: 4: Min assist Details for Bed Mobility Assistance: assist for right LE Transfers Sit to Stand: 5: Supervision;From bed;With upper extremity assist Stand to Sit: 4: Min assist Details for Transfer Assistance: assist to move right leg out to sit Ambulation/Gait Ambulation/Gait Assistance: 5: Supervision Ambulation Distance (Feet): 125 Feet Assistive device: Rolling walker Gait Pattern: Step-to pattern;Antalgic;Decreased stride length    Exercises Total Joint Exercises Heel Slides: 10 reps;Seated;AAROM     PT Goals (current goals can now be found in the care plan section)    Visit Information  Last PT Received On: 08/13/13 History of Present Illness: R TKA revsion    Subjective Data      Cognition  Cognition Arousal/Alertness: Awake/alert Behavior During Therapy: WFL for tasks assessed/performed Overall Cognitive Status:  Within Functional Limits for tasks assessed    Balance  Balance Balance Assessed: Yes Static Standing Balance Static Standing - Balance Support: No upper extremity supported Static Standing - Level of Assistance: 5: Stand by assistance  End of Session PT - End of Session Equipment Utilized During Treatment: Gait belt;Right knee immobilizer Activity Tolerance: Patient tolerated treatment well Patient left: in bed;with call bell/phone within reach;with family/visitor present CPM Right Knee CPM Right Knee: On   GP     [facility] 08/13/2013, 4:36 PM Sheran Lawless, PT [phone] 08/13/2013

## 2013-08-13 NOTE — Progress Notes (Signed)
Occupational Therapy Evaluation Patient Details Name: Deanna Rodgers MRN: [id number] DOB: [birth year] Today's Date: 08/13/2013 Time: 1041-1100 OT Time Calculation (min): 19 min  OT Assessment / Plan / Recommendation History of present illness R TKA revsion   Clinical Impression   Pt familiar with DME from previous surgeries. Educated pt on compensatory techniques and use of AE for LB ADL. Pt demonstrated understanding. Education completed. Family will assist as needed. No further OT needs.    OT Assessment  Patient does not need any further OT services    Follow Up Recommendations  No OT follow up    Barriers to Discharge      Equipment Recommendations  None recommended by OT    Recommendations for Other Services    Frequency       Precautions / Restrictions Precautions Precautions: Knee;Fall Required Braces or Orthoses: Knee Immobilizer - Right Knee Immobilizer - Right: Discontinue once straight leg raise with < 10 degree lag Restrictions Weight Bearing Restrictions: No   Pertinent Vitals/Pain 5. Knee.     ADL  Grooming: Set up;Supervision/safety Where Assessed - Grooming: Unsupported sitting Upper Body Bathing: Set up Where Assessed - Upper Body Bathing: Unsupported sitting Lower Body Bathing: Minimal assistance Where Assessed - Lower Body Bathing: Unsupported sit to stand Upper Body Dressing: Set up Where Assessed - Upper Body Dressing: Unsupported sitting Lower Body Dressing: Minimal assistance Where Assessed - Lower Body Dressing: Unsupported sit to stand Toilet Transfer: Supervision/safety Toilet Transfer Method: Sit to stand Toileting - Architect and Hygiene: Supervision/safety Where Assessed - Engineer, mining and Hygiene: Sit to stand from 3-in-1 or toilet Equipment Used: Rolling walker;Sock aid;Reacher Transfers/Ambulation Related to ADLs: S ADL Comments: Educated on use of AE for LB ADL. given recher and sock aid  Pt  positioning knee in flexion at rest in bed. Educated on importance of terminal knee extension. Pt left in bed with R knee bridged. Instructed to leave in terminal knee ext as tolerated.  OT Diagnosis:    OT Problem List:   OT Treatment Interventions:     OT Goals(Current goals can be found in the care plan section) Acute Rehab OT Goals Patient Stated Goal: Walk without pain  Visit Information  Last OT Received On: 08/13/13 Assistance Needed: +1 History of Present Illness: R TKA revsion       Prior Functioning     Home Living Family/patient expects to be discharged to:: Private residence Living Arrangements: Alone Available Help at Discharge: Friend(s);Family;Available PRN/intermittently;Home health;Personal care attendant Type of Home: Apartment Home Access: Level entry Home Layout: One level Home Equipment: Walker - 2 wheels;Bedside commode;Tub bench Additional Comments: Pt states she has home health aide 3 hrs a day Prior Function Level of Independence: Independent Communication Communication: No difficulties         Vision/Perception     Cognition  Cognition Arousal/Alertness: Awake/alert Behavior During Therapy: WFL for tasks assessed/performed Overall Cognitive Status: Within Functional Limits for tasks assessed    Extremity/Trunk Assessment Lower Extremity Assessment Lower Extremity Assessment: RLE deficits/detail RLE: Unable to fully assess due to pain Cervical / Trunk Assessment Cervical / Trunk Assessment: Normal     Mobility Bed Mobility Bed Mobility: Supine to Sit Supine to Sit: 5: Supervision Sit to Supine: 4: Min assist;Other (comment) (family assisted) Details for Bed Mobility Assistance: for for use of L LE to self assist and for sequencing:  Assist with R LE management Transfers Sit to Stand: 4: Min guard Stand to Sit: 5: Supervision Details for  Transfer Assistance: good carry over from earlier PT session     Exercise     Balance      End of Session OT - End of Session Equipment Utilized During Treatment: Rolling walker Activity Tolerance: Patient tolerated treatment well Patient left: in bed;with call bell/phone within reach;with family/visitor present Nurse Communication: Mobility status  GO     Jaileigh Weimer,HILLARY 08/13/2013, 11:16 AM Luisa Dago, OTR/L  [phone] 08/13/2013

## 2013-08-13 NOTE — Progress Notes (Signed)
Physical Therapy Treatment Patient Details Name: ANALISSA BAYLESS MRN: [id number] DOB: December 06, 1965 Today's Date: 08/13/2013 Time: 4098-1191 PT Time Calculation (min): 40 min  PT Assessment / Plan / Recommendation  History of Present Illness R TKA revsion   PT Comments   Patient able to ambulate increased distance with less assist.  Needed cues to correct knee positioning in bed (had stuffed animal keeping knee bent,) but able to extend fully with quad sets.  Will need HHPT at d/c.  Follow Up Recommendations  Home health PT     Does the patient have the potential to tolerate intense rehabilitation   N/A  Barriers to Discharge  None      Equipment Recommendations  None recommended by PT    Recommendations for Other Services  None  Frequency 7X/week   Progress towards PT Goals Progress towards PT goals: Progressing toward goals  Plan Current plan remains appropriate    Precautions / Restrictions Precautions Precautions: Knee;Fall Required Braces or Orthoses: Knee Immobilizer - Right Knee Immobilizer - Right: Discontinue once straight leg raise with < 10 degree lag Restrictions Weight Bearing Restrictions: No   Pertinent Vitals/Pain 5/10 right knee with ambulation    Mobility  Bed Mobility Bed Mobility: Supine to Sit Supine to Sit: 4: Min assist Sit to Supine: 4: Min assist Details for Bed Mobility Assistance: assist for right LE Transfers Sit to Stand: 5: Supervision;From bed;With upper extremity assist Stand to Sit: To bed;5: Supervision Details for Transfer Assistance: cues for safety to move leg out prior to sit for comfort Ambulation/Gait Ambulation/Gait Assistance: 5: Supervision Ambulation Distance (Feet): 100 Feet Assistive device: Rolling walker Ambulation/Gait Assistance Details: able to demonstrate good positioning with walker and sequenced correctly; antalgic on right with decreased step length Gait Pattern: Step-to pattern;Antalgic;Decreased stride length     Exercises Total Joint Exercises Ankle Circles/Pumps: AROM;Both;10 reps;Supine Quad Sets: AROM;10 reps;Supine;Both Short Arc Quad: AAROM;Right;10 reps;Supine Heel Slides: AAROM;Right;10 reps;Supine Hip ABduction/ADduction: AAROM;Right;10 reps;Supine    PT Goals (current goals can now be found in the care plan section) Acute Rehab PT Goals Patient Stated Goal: Walk without pain  Visit Information  Last PT Received On: 08/13/13 Assistance Needed: +1 History of Present Illness: R TKA revsion    Subjective Data  Patient Stated Goal: Walk without pain   Cognition  Cognition Arousal/Alertness: Awake/alert Behavior During Therapy: WFL for tasks assessed/performed Overall Cognitive Status: Within Functional Limits for tasks assessed    Balance     End of Session PT - End of Session Equipment Utilized During Treatment: Gait belt;Right knee immobilizer Activity Tolerance: Patient tolerated treatment well Patient left: in bed;with call bell/phone within reach;with family/visitor present   GP     [facility] 08/13/2013, 12:16 PM Sheran Lawless, PT [phone] 08/13/2013

## 2013-08-14 ENCOUNTER — Encounter (HOSPITAL_COMMUNITY): Payer: Self-pay | Admitting: Orthopaedic Surgery

## 2013-08-14 LAB — CBC
HCT: 26.4 % — ABNORMAL LOW (ref 36.0–46.0)
Hemoglobin: 8.5 g/dL — ABNORMAL LOW (ref 12.0–15.0)
MCH: 21.9 pg — ABNORMAL LOW (ref 26.0–34.0)
MCHC: 32.2 g/dL (ref 30.0–36.0)
MCV: 67.9 fL — ABNORMAL LOW (ref 78.0–100.0)
Platelets: 132 10*3/uL — ABNORMAL LOW (ref 150–400)
RBC: 3.89 MIL/uL (ref 3.87–5.11)
RDW: 16.6 % — ABNORMAL HIGH (ref 11.5–15.5)
WBC: 8.4 10*3/uL (ref 4.0–10.5)

## 2013-08-14 LAB — TYPE AND SCREEN
ABO/RH(D): A POS
Antibody Screen: NEGATIVE
Unit division: 0

## 2013-08-14 MED ORDER — KETOROLAC TROMETHAMINE 30 MG/ML IJ SOLN
30.0000 mg | Freq: Once | INTRAMUSCULAR | Status: DC
  Filled 2013-08-14: qty 1

## 2013-08-14 MED ORDER — TIZANIDINE HCL 4 MG PO CAPS: 4.0000 mg | ORAL_CAPSULE | Freq: Three times a day (TID) | ORAL | Status: DC | PRN

## 2013-08-14 MED ORDER — OXYCODONE-ACETAMINOPHEN 5-325 MG PO TABS: 1.0000 | ORAL_TABLET | ORAL | Status: DC | PRN

## 2013-08-14 MED ORDER — ASPIRIN 325 MG PO TBEC: 325.0000 mg | DELAYED_RELEASE_TABLET | Freq: Two times a day (BID) | ORAL | Status: DC

## 2013-08-14 NOTE — Progress Notes (Signed)
Subjective: 3 Days Post-Op Procedure(s) (LRB): REVISION RIGHT TOTAL KNEE ARTHROPLASTY (Right) Patient reports pain as moderate.  Working well with therapy.  Hgb up after transfusion.  Objective: Vital signs in last 24 hours: Temp:  [97.8 F (36.6 C)-100.7 F (38.2 C)] 99.9 F (37.7 C) (10/13 0510) Pulse Rate:  [104-119] 116 (10/13 0510) Resp:  [16-20] 18 (10/13 0510) BP: (92-119)/(62-79) 114/79 mmHg (10/13 0510) SpO2:  [93 %-97 %] 95 % (10/13 0510)  Intake/Output from previous day: 10/12 0701 - 10/13 0700 In: 640.8 [P.O.:120; I.V.:508.3; Blood:12.5] Out: 1100 [Urine:1100] Intake/Output this shift:     Recent Labs  08/12/13 0511 08/13/13 0513 08/14/13 0505  HGB 7.7* 7.6* 8.5*    Recent Labs  08/13/13 0513 08/14/13 0505  WBC 7.9 8.4  RBC 3.61* 3.89  HCT 23.9* 26.4*  PLT 121* 132*    Recent Labs  08/12/13 0511  NA 134*  K 3.4*  CL 104  CO2 25  BUN 12  CREATININE 0.65  GLUCOSE 105*  CALCIUM 7.7*   No results found for this basename: LABPT, INR,  in the last 72 hours  Sensation intact distally Intact pulses distally Dorsiflexion/Plantar flexion intact Incision: scant drainage No cellulitis present Compartment soft  Assessment/Plan: 3 Days Post-Op Procedure(s) (LRB): REVISION RIGHT TOTAL KNEE ARTHROPLASTY (Right) Discharge home with home health  Deanna Rodgers 08/14/2013, 7:49 AM

## 2013-08-14 NOTE — Progress Notes (Signed)
Pt expressed concern over going home with her pain at a 7/10. She expressed her desire to stay one more night. I noticed she was due for pain medicine as well as her muscle relaxer and gave that to her to see if her pain was more manageable. At her follow up pain med check she stated her pain felt the same and she requested I call Dr. Magnus Ivan with her concern. Received order from Dr. Magnus Ivan to give 30 mg IV toradol and proceed with discharge. I entered the pt's room to administer this medication and the patient was ambulating about the room packing up her belongings. Pt denied needing any assistance. I found her IV line dangling and she stated she took her IV out. Discharge paperwork given with follow up care/appts/instructions discussed. Pt verbalized understanding. Time allowed for questions and concerns, pt states she has none.  Alwyn Ren, RN

## 2013-08-14 NOTE — Progress Notes (Signed)
Physical Therapy Treatment Patient Details Name: VESTA WHEELAND MRN: [id number] DOB: 1965/12/07 Today's Date: 08/14/2013 Time: 8469-6295 PT Time Calculation (min): 27 min  PT Assessment / Plan / Recommendation  History of Present Illness R TKA revsion   PT Comments     Follow Up Recommendations  Home health PT     Does the patient have the potential to tolerate intense rehabilitation     Barriers to Discharge        Equipment Recommendations  None recommended by PT    Recommendations for Other Services    Frequency 7X/week   Progress towards PT Goals Progress towards PT goals: Progressing toward goals  Plan Current plan remains appropriate    Precautions / Restrictions Precautions Precautions: Knee;Fall Required Braces or Orthoses: Knee Immobilizer - Right Knee Immobilizer - Right: Discontinue once straight leg raise with < 10 degree lag Restrictions Weight Bearing Restrictions: No   Pertinent Vitals/Pain C/o pain throughout, not rated, RN aware, pt was premedicated    Mobility  Bed Mobility Bed Mobility: Supine to Sit;Sit to Supine Supine to Sit: 4: Min assist Sit to Supine: 4: Min assist Details for Bed Mobility Assistance: assist for right LE Transfers Transfers: Sit to Stand;Stand to Sit Sit to Stand: 5: Supervision;From bed;With upper extremity assist Stand to Sit: 4: Min guard;To bed;With upper extremity assist Details for Transfer Assistance: assist to move right leg out to sit Ambulation/Gait Ambulation/Gait Assistance: 5: Supervision Ambulation Distance (Feet): 90 Feet Assistive device: Rolling walker Ambulation/Gait Assistance Details: cues for sequence and step length Gait Pattern: Step-to pattern;Antalgic;Decreased stride length    Exercises Total Joint Exercises Ankle Circles/Pumps: AROM;Both;10 reps;Supine Quad Sets: AROM;10 reps;Supine;Both Heel Slides: 10 reps;AAROM;Supine Straight Leg Raises: AAROM;Right;10 reps   PT Diagnosis:    PT  Problem List:   PT Treatment Interventions:     PT Goals (current goals can now be found in the care plan section) Acute Rehab PT Goals Patient Stated Goal: Walk without pain Time For Goal Achievement: 08/19/13 Potential to Achieve Goals: Good  Visit Information  Last PT Received On: 08/14/13 Assistance Needed: +1 History of Present Illness: R TKA revsion    Subjective Data  Patient Stated Goal: Walk without pain   Cognition  Cognition Arousal/Alertness: Awake/alert Behavior During Therapy: WFL for tasks assessed/performed Overall Cognitive Status: Within Functional Limits for tasks assessed    Balance     End of Session PT - End of Session Equipment Utilized During Treatment: Gait belt;Right knee immobilizer Activity Tolerance: Patient tolerated treatment well Patient left: in bed;with call bell/phone within reach;with family/visitor present Nurse Communication: Mobility status CPM Right Knee CPM Right Knee: Off   GP     [facility] 08/14/2013, 12:18 PM

## 2013-08-14 NOTE — Discharge Summary (Signed)
Patient ID: Deanna Rodgers MRN: [id number] DOB/AGE: 45-May-1967 45 y.o.  Admit date: 08/11/2013 Discharge date: 08/14/2013  Admission Diagnoses:  Principal Problem:   Failed total knee arthroplasty, right   Discharge Diagnoses:  Same  Past Medical History  Diagnosis Date  . Depression   . History of duodenal ulcer   . History of gastroesophageal reflux (GERD)   . Anemia   . Abnormal Pap smear     bx  . Hypertension   . 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 Pain     LOWER BACK     Surgeries: Procedure(s): REVISION RIGHT TOTAL KNEE ARTHROPLASTY on 08/11/2013   Consultants:    Discharged Condition: Improved  Hospital Course: Deanna Rodgers is an 47 y.o. female who was admitted 08/11/2013 for operative treatment ofFailed total knee arthroplasty. Patient has severe unremitting pain that affects sleep, daily activities, and work/hobbies. After pre-op clearance the patient was taken to the operating room on 08/11/2013 and underwent  Procedure(s): REVISION RIGHT TOTAL KNEE ARTHROPLASTY.    Patient was given perioperative antibiotics: Anti-infectives   Start     Dose/Rate Route Frequency Ordered Stop   08/11/13 1230  ceFAZolin (ANCEF) IVPB 1 g/50 mL premix     1 g 100 mL/hr over 30 Minutes Intravenous Every 6 hours 08/11/13 1228 08/11/13 1830   08/11/13 0608  ceFAZolin (ANCEF) IVPB 2 g/50 mL premix     2 g 100 mL/hr over 30 Minutes Intravenous On call to O.R. 08/11/13 1308 08/11/13 0741       Patient was given sequential compression devices, early ambulation, and chemoprophylaxis to prevent DVT.  Patient benefited maximally from hospital stay and there were no complications.    Recent vital signs: Patient Vitals for the past 24 hrs:  BP Temp Temp src Pulse Resp SpO2  08/14/13 0510 114/79 mmHg 99.9 F (37.7 C) Oral 116 18 95 %  08/13/13 2230 99/65 mmHg 100.7 F  (38.2 C) Oral 119 18 93 %  08/13/13 1400 92/62 mmHg 98.5 F (36.9 C) Oral 104 16 95 %  08/13/13 1300 101/65 mmHg 97.8 F (36.6 C) Oral 111 20 94 %  08/13/13 1200 105/71 mmHg 99.9 F (37.7 C) Oral 119 20 97 %  08/13/13 1123 119/75 mmHg 98 F (36.7 C) Oral 116 16 97 %     Recent laboratory studies:  Recent Labs  08/12/13 0511 08/13/13 0513 08/14/13 0505  WBC 9.8 7.9 8.4  HGB 7.7* 7.6* 8.5*  HCT 25.6* 23.9* 26.4*  PLT 136* 121* 132*  NA 134*  --   --   K 3.4*  --   --   CL 104  --   --   CO2 25  --   --   BUN 12  --   --   CREATININE 0.65  --   --   GLUCOSE 105*  --   --   CALCIUM 7.7*  --   --      Discharge Medications:     Medication List    STOP taking these medications       meloxicam 7.5 MG tablet  Commonly known as:  MOBIC      TAKE these medications       ALPRAZolam 0.25 MG tablet  Commonly known as:  XANAX  Take 0.25 mg by mouth daily as needed for anxiety.  aspirin 325 MG EC tablet  Take 1 tablet (325 mg total) by mouth 2 (two) times daily after a meal.     buPROPion 150 MG 24 hr tablet  Commonly known as:  WELLBUTRIN XL  Take 150 mg by mouth every morning.     losartan-hydrochlorothiazide 50-12.5 MG per tablet  Commonly known as:  HYZAAR  Take 1 tablet by mouth every morning.     OVER THE COUNTER MEDICATION  OTC IRON PILLS ONCE A DAY     oxyCODONE-acetaminophen 5-325 MG per tablet  Commonly known as:  ROXICET  Take 1-2 tablets by mouth every 4 (four) hours as needed for pain.     sertraline 100 MG tablet  Commonly known as:  ZOLOFT  Take 200 mg by mouth every morning.     tiZANidine 4 MG capsule  Commonly known as:  ZANAFLEX  Take 1 capsule (4 mg total) by mouth 3 (three) times daily as needed for muscle spasms.     traZODone 100 MG tablet  Commonly known as:  DESYREL  Take 200 mg by mouth at bedtime.        Diagnostic Studies: Dg Chest 2 View  08/02/2013   CLINICAL DATA:  Preop knee arthroplasty  EXAM: CHEST  2 VIEW   COMPARISON:  09/22/2008  FINDINGS: The heart size and mediastinal contours are within normal limits. Both lungs are clear. The visualized skeletal structures are unremarkable.  IMPRESSION: No active cardiopulmonary disease.   Electronically Signed   By: Marlan Palau M.D.   On: 08/02/2013 16:25   Dg Knee Right Port  08/11/2013   CLINICAL DATA:  Right knee arthroplasty revision.  EXAM: PORTABLE RIGHT KNEE - 1-2 VIEW  COMPARISON:  Tooth 12/23/2011.  FINDINGS: Since the prior exam, the arthroplasty has been revised. The new femoral and tibial components have long intra medullary stems. The components are well-seated and aligned with no evidence of an acute fracture.  There is no evidence of an operative complication.  IMPRESSION: New revision right knee arthroplasty. Components are well-seated and aligned.   Electronically Signed   By: Amie Portland M.D.   On: 08/11/2013 11:42    Disposition: 01-Home or Self Care      Discharge Orders   Future Orders Complete By Expires   Call MD / Call 911  As directed    Comments:     If you experience chest pain or shortness of breath, CALL 911 and be transported to the hospital emergency room.  If you develope a fever above 101 F, pus (white drainage) or increased drainage or redness at the wound, or calf pain, call your surgeon's office.   Constipation Prevention  As directed    Comments:     Drink plenty of fluids.  Prune juice may be helpful.  You may use a stool softener, such as Colace (over the counter) 100 mg twice a day.  Use MiraLax (over the counter) for constipation as needed.   Diet - low sodium heart healthy  As directed    Discharge instructions  As directed    Comments:     Work on aggressive right knee motion. Ice and elevation for swelling. Full weight as tolerated. You can get your current knee dressing wet in the shower. You can remove your dressing and get your actual incision wet starting 08/17/13, then new dry dressing daily.    Discharge patient  As directed    Increase activity slowly as tolerated  As directed  Follow-up Information   Follow up with [facility]. (Home Health Physical Therapy)    Contact information:   [phone]      Follow up with [name], MD. Schedule an appointment as soon as possible for a visit in 2 weeks.   Specialty:  Orthopedic Surgery   Contact information:   [address] [place] [address] [phone]        Signed: [name] 08/14/2013, 7:53 AM

## 2013-09-11 ENCOUNTER — Ambulatory Visit: Payer: Medicaid Other | Attending: Orthopaedic Surgery

## 2013-09-11 DIAGNOSIS — R262 Difficulty in walking, not elsewhere classified: Secondary | ICD-10-CM | POA: Insufficient documentation

## 2013-09-11 DIAGNOSIS — R609 Edema, unspecified: Secondary | ICD-10-CM | POA: Insufficient documentation

## 2013-09-11 DIAGNOSIS — IMO0001 Reserved for inherently not codable concepts without codable children: Secondary | ICD-10-CM | POA: Insufficient documentation

## 2013-09-11 DIAGNOSIS — M25569 Pain in unspecified knee: Secondary | ICD-10-CM | POA: Insufficient documentation

## 2013-09-11 DIAGNOSIS — Z96659 Presence of unspecified artificial knee joint: Secondary | ICD-10-CM | POA: Insufficient documentation

## 2013-09-11 DIAGNOSIS — M25669 Stiffness of unspecified knee, not elsewhere classified: Secondary | ICD-10-CM | POA: Insufficient documentation

## 2013-09-19 ENCOUNTER — Ambulatory Visit: Payer: Medicaid Other | Admitting: Physical Therapy

## 2013-10-09 ENCOUNTER — Ambulatory Visit: Payer: Medicaid Other | Attending: Orthopaedic Surgery | Admitting: Physical Therapy

## 2013-10-09 DIAGNOSIS — R609 Edema, unspecified: Secondary | ICD-10-CM | POA: Insufficient documentation

## 2013-10-09 DIAGNOSIS — M25569 Pain in unspecified knee: Secondary | ICD-10-CM | POA: Insufficient documentation

## 2013-10-09 DIAGNOSIS — R262 Difficulty in walking, not elsewhere classified: Secondary | ICD-10-CM | POA: Insufficient documentation

## 2013-10-09 DIAGNOSIS — IMO0001 Reserved for inherently not codable concepts without codable children: Secondary | ICD-10-CM | POA: Insufficient documentation

## 2013-10-09 DIAGNOSIS — Z96659 Presence of unspecified artificial knee joint: Secondary | ICD-10-CM | POA: Insufficient documentation

## 2013-10-09 DIAGNOSIS — M25669 Stiffness of unspecified knee, not elsewhere classified: Secondary | ICD-10-CM | POA: Insufficient documentation

## 2013-10-25 ENCOUNTER — Emergency Department (HOSPITAL_COMMUNITY): Payer: Medicaid Other

## 2013-10-25 ENCOUNTER — Encounter (HOSPITAL_COMMUNITY): Payer: Self-pay | Admitting: Emergency Medicine

## 2013-10-25 ENCOUNTER — Emergency Department (HOSPITAL_COMMUNITY)
Admission: EM | Admit: 2013-10-25 | Discharge: 2013-10-26 | Disposition: A | Discharge status: Home / Self Care | Payer: Medicaid Other | Attending: Emergency Medicine | Admitting: Emergency Medicine

## 2013-10-25 DIAGNOSIS — Y9301 Activity, walking, marching and hiking: Secondary | ICD-10-CM | POA: Insufficient documentation

## 2013-10-25 DIAGNOSIS — M1710 Unilateral primary osteoarthritis, unspecified knee: Secondary | ICD-10-CM | POA: Insufficient documentation

## 2013-10-25 DIAGNOSIS — R296 Repeated falls: Secondary | ICD-10-CM | POA: Insufficient documentation

## 2013-10-25 DIAGNOSIS — Z96659 Presence of unspecified artificial knee joint: Secondary | ICD-10-CM | POA: Insufficient documentation

## 2013-10-25 DIAGNOSIS — Y9229 Other specified public building as the place of occurrence of the external cause: Secondary | ICD-10-CM | POA: Insufficient documentation

## 2013-10-25 DIAGNOSIS — F329 Major depressive disorder, single episode, unspecified: Secondary | ICD-10-CM | POA: Insufficient documentation

## 2013-10-25 DIAGNOSIS — I10 Essential (primary) hypertension: Secondary | ICD-10-CM | POA: Insufficient documentation

## 2013-10-25 DIAGNOSIS — IMO0002 Reserved for concepts with insufficient information to code with codable children: Secondary | ICD-10-CM | POA: Insufficient documentation

## 2013-10-25 DIAGNOSIS — F3289 Other specified depressive episodes: Secondary | ICD-10-CM | POA: Insufficient documentation

## 2013-10-25 DIAGNOSIS — S8391XA Sprain of unspecified site of right knee, initial encounter: Secondary | ICD-10-CM

## 2013-10-25 DIAGNOSIS — Z79899 Other long term (current) drug therapy: Secondary | ICD-10-CM | POA: Insufficient documentation

## 2013-10-25 DIAGNOSIS — Z8719 Personal history of other diseases of the digestive system: Secondary | ICD-10-CM | POA: Insufficient documentation

## 2013-10-25 DIAGNOSIS — Z862 Personal history of diseases of the blood and blood-forming organs and certain disorders involving the immune mechanism: Secondary | ICD-10-CM | POA: Insufficient documentation

## 2013-10-25 MED ORDER — OXYCODONE-ACETAMINOPHEN 5-325 MG PO TABS
1.0000 | ORAL_TABLET | Freq: Once | ORAL | Status: AC
  Administered 2013-10-26: 1 via ORAL
  Filled 2013-10-25: qty 1

## 2013-10-25 MED ORDER — KETOROLAC TROMETHAMINE 60 MG/2ML IM SOLN
60.0000 mg | Freq: Once | INTRAMUSCULAR | Status: AC
  Administered 2013-10-26: 60 mg via INTRAMUSCULAR
  Filled 2013-10-25: qty 2

## 2013-10-25 MED ORDER — HYDROMORPHONE HCL PF 1 MG/ML IJ SOLN
1.0000 mg | Freq: Once | INTRAMUSCULAR | Status: AC
  Administered 2013-10-25: 1 mg via INTRAMUSCULAR
  Filled 2013-10-25: qty 1

## 2013-10-25 NOTE — ED Provider Notes (Signed)
CSN: [id number]     Arrival date & time 10/25/13  2054 History   First MD Initiated Contact with Patient 10/25/13 2155     Chief Complaint  Patient presents with  . Knee Pain   (Consider location/radiation/quality/duration/timing/severity/associated sxs/prior Treatment) HPI Deanna Rodgers is a 47 y.o. female who presents emergency department complaining of right knee injury. Patient states that she has had 6 surgeries on the same knee with the last one a total knee replacement revision 2 months ago. States her surgeon was Dr. Rayburn Ma. States that she has been doing well since her surgery. She states that she keeps some swelling in that knee but states she was able to walk and function normally. She states today she was walk and at the store and she fell down with her right leg stretched out in front of her and to the side and states that her knee bent inward. States since then she has had increased swelling and pain in that knee. She states she's unable to straighten it. She states that she was able to "a lump" and drive herself and her children home from the store. She states she has not been able to walk on it since then.  Past Medical History  Diagnosis Date  . Depression   . History of duodenal ulcer   . History of gastroesophageal reflux (GERD)   . Anemia   . Abnormal Pap smear     bx  . Hypertension   . 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 Pain     LOWER BACK    Past Surgical History  Procedure Laterality Date  . Cesarean section  12-24-11    '85- x1  . Knee arthroscopy  12-24-11    [facility] -[facility]  . Tubal ligation    . Knee arthroplasty  12/25/2011    Procedure: COMPUTER ASSISTED TOTAL KNEE ARTHROPLASTY;  Surgeon: [name], MD;  Location: [facility];  Service: Orthopedics;  Laterality: Right;  . Joint replacement  12/2011    rt total knee  . Knee  arthroscopy Right 12/23/2012    Procedure: Right Knee Arthroscopy with minimal Debridement ;  Surgeon: [name], MD;  Location: [facility];  Service: Orthopedics;  Laterality: Right;  Right Knee Arthroscopy with minimal  Debridement   . Hemorroidectomy  yrs ago  . Total knee revision Right 04/14/2013    Procedure: Synovectomy right total knee with poly exchange;  Surgeon: [name], MD;  Location: [facility];  Service: Orthopedics;  Laterality: Right;  . Total knee revision Right 08/11/2013    Procedure: REVISION RIGHT TOTAL KNEE ARTHROPLASTY;  Surgeon: [name], MD;  Location: [facility];  Service: Orthopedics;  Laterality: Right;   Family History  Problem Relation Age of Onset  . Hypertension Father   . Stroke Father   . Hypertension Maternal Aunt   . Cancer Maternal Uncle   . Cancer Paternal Uncle   . Hypertension Maternal Grandmother   . Stroke Maternal Grandmother    History  Substance Use Topics  . Smoking status: Never Smoker   . Smokeless tobacco: Never Used  . Alcohol Use: No   OB History   Grav Para Term Preterm Abortions TAB SAB Ect Mult Living   5 5 3 2  0 0 0 0 0 5     Review of Systems  Constitutional:  Negative for fever and chills.  Respiratory: Negative for cough, chest tightness and shortness of breath.   Cardiovascular: Negative for chest pain, palpitations and leg swelling.  Musculoskeletal: Positive for arthralgias and joint swelling. Negative for myalgias, neck pain and neck stiffness.  Skin: Negative for rash.  Neurological: Negative for dizziness, weakness and headaches.  All other systems reviewed and are negative.    Allergies  Review of patient's allergies indicates no known allergies.  Home Medications   Current Outpatient Rx  Name  Route  Sig  Dispense  Refill  . ALPRAZolam (XANAX) 0.25 MG tablet   Oral   Take 0.25 mg by mouth daily as needed for anxiety.          Marland Kitchen buPROPion (WELLBUTRIN XL) 150 MG 24 hr  tablet   Oral   Take 150 mg by mouth every morning.         Marland Kitchen losartan-hydrochlorothiazide (HYZAAR) 50-12.5 MG per tablet   Oral   Take 1 tablet by mouth every morning.         . sertraline (ZOLOFT) 100 MG tablet   Oral   Take 200 mg by mouth every morning.         Marland Kitchen tiZANidine (ZANAFLEX) 4 MG capsule   Oral   Take 1 capsule (4 mg total) by mouth 3 (three) times daily as needed for muscle spasms.   60 capsule   0   . traZODone (DESYREL) 100 MG tablet   Oral   Take 200 mg by mouth at bedtime.           BP 150/93  Pulse 107  Temp(Src) 99.6 F (37.6 C) (Oral)  Resp 22  Ht 5\' 3"  (1.6 m)  Wt 150 lb (68.04 kg)  BMI 26.58 kg/m2  SpO2 99%  LMP 09/11/2013 Physical Exam  Nursing note and vitals reviewed. Constitutional: She appears well-developed and well-nourished. No distress.  HENT:  Head: Normocephalic.  Eyes: Conjunctivae are normal.  Neck: Neck supple.  Cardiovascular: Normal rate, regular rhythm and normal heart sounds.   Pulmonary/Chest: Effort normal and breath sounds normal. No respiratory distress. She has no wheezes. She has no rales.  Abdominal: Soft. Bowel sounds are normal. She exhibits no distension. There is no tenderness. There is no rebound.  Musculoskeletal: She exhibits no edema.  Large healed surgical incision over anterior knee. Tender over posterior, medial, and lateral joint. Pain with any ROM. Significant swelling noted.   Neurological: She is alert.  Skin: Skin is warm and dry.  Psychiatric: She has a normal mood and affect. Her behavior is normal.    ED Course  Procedures (including critical care time) Labs Review Labs Reviewed - No data to display Imaging Review Dg Knee Complete 4 Views Right  10/25/2013   CLINICAL DATA:  Status post fall; right lateral and subpatellar knee pain.  EXAM: RIGHT KNEE - COMPLETE 4+ VIEW  COMPARISON:  Right knee radiographs performed 08/11/2013  FINDINGS: There is no definite evidence of fracture or  dislocation. The patient's right knee arthroplasty appears grossly intact, without evidence of loosening or displacement. An underlying small knee joint effusion is suspected. Diffuse edema is noted within Hoffa's fat pad. Mild diffuse soft tissue swelling is noted.  IMPRESSION: 1. No definite evidence of fracture or dislocation. Right knee arthroplasty appears grossly intact, without evidence of loosening. 2. Suspect underlying small knee joint effusion. Diffuse edema noted within Hoffa's fat pad. Mild diffuse soft tissue swelling noted about the knee.   Electronically Signed  By: Roanna Raider M.D.   On: 10/25/2013 23:17    EKG Interpretation   None       MDM   1. Knee sprain and strain, right, initial encounter     Patient with right knee pain and swelling after falling at the store today. She has history of multiple surgeries including a recent replacement 2 months ago. I'm unable to assess good stability of the joint due to severe pain. X-ray obtained and is negative. I will place her in the immobilizer, crutches, pain medications at home. She'll need to followup with her orthopedic specialist a sinus she is able. Patient agrees with the plan. Patient had no other injuries.  Filed Vitals:   10/25/13 2101  BP: 150/93  Pulse: 107  Temp: 99.6 F (37.6 C)  TempSrc: Oral  Resp: 22  Height: 5\' 3"  (1.6 m)  Weight: 150 lb (68.04 kg)  SpO2: 99%       Myriam Jacobson Jameis Newsham, PA-C 10/26/13 0134

## 2013-10-25 NOTE — ED Notes (Signed)
Patient transported to X-ray 

## 2013-10-25 NOTE — ED Notes (Signed)
Pt arrived to the ED with a complaint of right knee pain.  Pt had a total knee replacement in October to the same knee.  [place] the [place] backwards.

## 2013-10-26 MED ORDER — OXYCODONE-ACETAMINOPHEN 5-325 MG PO TABS: 1.0000 | ORAL_TABLET | ORAL | Status: DC | PRN

## 2013-10-26 NOTE — ED Provider Notes (Signed)
Medical screening examination/treatment/procedure(s) were performed by non-physician practitioner and as supervising physician I was immediately available for consultation/collaboration.  EKG Interpretation   None        Toy Baker, MD 10/26/13 2306

## 2013-11-14 ENCOUNTER — Ambulatory Visit: Payer: Medicaid Other | Attending: Orthopaedic Surgery

## 2013-11-14 DIAGNOSIS — IMO0001 Reserved for inherently not codable concepts without codable children: Secondary | ICD-10-CM | POA: Insufficient documentation

## 2013-11-14 DIAGNOSIS — M25569 Pain in unspecified knee: Secondary | ICD-10-CM | POA: Insufficient documentation

## 2013-11-14 DIAGNOSIS — M25669 Stiffness of unspecified knee, not elsewhere classified: Secondary | ICD-10-CM | POA: Insufficient documentation

## 2013-11-14 DIAGNOSIS — Z96659 Presence of unspecified artificial knee joint: Secondary | ICD-10-CM | POA: Insufficient documentation

## 2013-11-14 DIAGNOSIS — R262 Difficulty in walking, not elsewhere classified: Secondary | ICD-10-CM | POA: Insufficient documentation

## 2013-11-14 DIAGNOSIS — R609 Edema, unspecified: Secondary | ICD-10-CM | POA: Insufficient documentation

## 2013-11-24 ENCOUNTER — Emergency Department (HOSPITAL_COMMUNITY)
Admission: EM | Admit: 2013-11-24 | Discharge: 2013-11-24 | Disposition: A | Discharge status: Home / Self Care | Payer: Medicaid Other | Attending: Emergency Medicine | Admitting: Emergency Medicine

## 2013-11-24 ENCOUNTER — Emergency Department (HOSPITAL_COMMUNITY): Payer: Medicaid Other

## 2013-11-24 ENCOUNTER — Encounter (HOSPITAL_COMMUNITY): Payer: Self-pay | Admitting: Emergency Medicine

## 2013-11-24 DIAGNOSIS — F3289 Other specified depressive episodes: Secondary | ICD-10-CM | POA: Insufficient documentation

## 2013-11-24 DIAGNOSIS — Z862 Personal history of diseases of the blood and blood-forming organs and certain disorders involving the immune mechanism: Secondary | ICD-10-CM | POA: Insufficient documentation

## 2013-11-24 DIAGNOSIS — Z8719 Personal history of other diseases of the digestive system: Secondary | ICD-10-CM | POA: Insufficient documentation

## 2013-11-24 DIAGNOSIS — H538 Other visual disturbances: Secondary | ICD-10-CM | POA: Insufficient documentation

## 2013-11-24 DIAGNOSIS — F329 Major depressive disorder, single episode, unspecified: Secondary | ICD-10-CM | POA: Insufficient documentation

## 2013-11-24 DIAGNOSIS — R51 Headache: Secondary | ICD-10-CM | POA: Insufficient documentation

## 2013-11-24 DIAGNOSIS — Z79899 Other long term (current) drug therapy: Secondary | ICD-10-CM | POA: Insufficient documentation

## 2013-11-24 DIAGNOSIS — R112 Nausea with vomiting, unspecified: Secondary | ICD-10-CM | POA: Insufficient documentation

## 2013-11-24 DIAGNOSIS — Z8739 Personal history of other diseases of the musculoskeletal system and connective tissue: Secondary | ICD-10-CM | POA: Insufficient documentation

## 2013-11-24 DIAGNOSIS — R519 Headache, unspecified: Secondary | ICD-10-CM

## 2013-11-24 DIAGNOSIS — I10 Essential (primary) hypertension: Secondary | ICD-10-CM | POA: Insufficient documentation

## 2013-11-24 MED ORDER — FENTANYL CITRATE 0.05 MG/ML IJ SOLN
75.0000 ug | Freq: Once | INTRAMUSCULAR | Status: AC
  Administered 2013-11-24: 75 ug via INTRAVENOUS
  Filled 2013-11-24: qty 2

## 2013-11-24 MED ORDER — SODIUM CHLORIDE 0.9 % IV BOLUS (SEPSIS)
1000.0000 mL | Freq: Once | INTRAVENOUS | Status: AC
  Administered 2013-11-24: 1000 mL via INTRAVENOUS

## 2013-11-24 MED ORDER — ONDANSETRON 4 MG PO TBDP: ORAL_TABLET | ORAL | Status: DC

## 2013-11-24 MED ORDER — KETOROLAC TROMETHAMINE 30 MG/ML IJ SOLN
30.0000 mg | Freq: Once | INTRAMUSCULAR | Status: AC
  Administered 2013-11-24: 30 mg via INTRAVENOUS
  Filled 2013-11-24: qty 1

## 2013-11-24 MED ORDER — DIPHENHYDRAMINE HCL 50 MG/ML IJ SOLN
25.0000 mg | Freq: Once | INTRAMUSCULAR | Status: AC
  Administered 2013-11-24: 25 mg via INTRAVENOUS
  Filled 2013-11-24: qty 1

## 2013-11-24 MED ORDER — METOCLOPRAMIDE HCL 5 MG/ML IJ SOLN
10.0000 mg | Freq: Once | INTRAMUSCULAR | Status: AC
  Administered 2013-11-24: 10 mg via INTRAVENOUS
  Filled 2013-11-24: qty 2

## 2013-11-24 MED ORDER — PROMETHAZINE HCL 25 MG/ML IJ SOLN
12.5000 mg | Freq: Once | INTRAMUSCULAR | Status: AC
  Administered 2013-11-24: 12.5 mg via INTRAVENOUS
  Filled 2013-11-24: qty 1

## 2013-11-24 MED ORDER — DEXAMETHASONE SODIUM PHOSPHATE 10 MG/ML IJ SOLN
10.0000 mg | Freq: Once | INTRAMUSCULAR | Status: AC
  Administered 2013-11-24: 10 mg via INTRAVENOUS
  Filled 2013-11-24: qty 1

## 2013-11-24 NOTE — Discharge Instructions (Signed)
Take zofran for nausea. If you were given medicines take as directed.  If you are on coumadin or contraceptives realize their levels and effectiveness is altered by many different medicines.  If you have any reaction (rash, tongues swelling, other) to the medicines stop taking and see a physician.   Please follow up as directed and return to the ER or see a physician for new or worsening symptoms (worsening headache, pass out, neurologic symptoms- ie. Arm or leg weakness, fevers, neck stiffness)  Thank you.

## 2013-11-24 NOTE — ED Notes (Signed)
Pt c/o headache and nausea x 3 days.  Pain score 8/10.  Pt sts "I thought it was my blood pressure, but it's not getting any better and I'm taking my medication."

## 2013-11-24 NOTE — ED Provider Notes (Signed)
CSN: [id number]     Arrival date & time 11/24/13  1244 History   First MD Initiated Contact with Patient 11/24/13 1315     Chief Complaint  Patient presents with  . Hypertension  . Headache  . Emesis   (Consider location/radiation/quality/duration/timing/severity/associated sxs/prior Treatment) HPI Comments: 48 yo female with HTN (taking medicines) presents with temple HA bilateral, gradually worsening for 3 days, mild nausea.  This is different than previous HA.  Gradual onset, no one else with HA in the house, no injuries.  No neck stiffness or fevers.  Constant ache.  No blood clot hx, recent surgery or birth control.    Patient is a 48 y.o. female presenting with hypertension, headaches, and vomiting. The history is provided by the patient.  Hypertension Associated symptoms include headaches. Pertinent negatives include no chest pain, no abdominal pain and no shortness of breath.  Headache Associated symptoms: nausea and vomiting   Associated symptoms: no abdominal pain, no congestion, no pain, no fever, no neck pain, no neck stiffness, no numbness and no seizures   Emesis Associated symptoms: headaches   Associated symptoms: no abdominal pain and no chills     Past Medical History  Diagnosis Date  . Depression   . History of duodenal ulcer   . History of gastroesophageal reflux (GERD)   . Anemia   . Abnormal Pap smear     bx  . Hypertension   . 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 Pain     LOWER BACK    Past Surgical History  Procedure Laterality Date  . Cesarean section  12-24-11    '85- x1  . Knee arthroscopy  12-24-11    [facility] -[facility]  . Tubal ligation    . Knee arthroplasty  12/25/2011    Procedure: COMPUTER ASSISTED TOTAL KNEE ARTHROPLASTY;  Surgeon: Mcarthur Rossetti, MD;  Location: [facility];  Service: Orthopedics;  Laterality: Right;  . Joint  replacement  12/2011    rt total knee  . Knee arthroscopy Right 12/23/2012    Procedure: Right Knee Arthroscopy with minimal Debridement ;  Surgeon: Mcarthur Rossetti, MD;  Location: [facility];  Service: Orthopedics;  Laterality: Right;  Right Knee Arthroscopy with minimal  Debridement   . Hemorroidectomy  yrs ago  . Total knee revision Right 04/14/2013    Procedure: Synovectomy right total knee with poly exchange;  Surgeon: Mcarthur Rossetti, MD;  Location: [facility];  Service: Orthopedics;  Laterality: Right;  . Total knee revision Right 08/11/2013    Procedure: REVISION RIGHT TOTAL KNEE ARTHROPLASTY;  Surgeon: Mcarthur Rossetti, MD;  Location: [facility];  Service: Orthopedics;  Laterality: Right;   Family History  Problem Relation Age of Onset  . Hypertension Father   . Stroke Father   . Hypertension Maternal Aunt   . Cancer Maternal Uncle   . Cancer Paternal Uncle   . Hypertension Maternal Grandmother   . Stroke Maternal Grandmother    History  Substance Use Topics  . Smoking status: Never Smoker   . Smokeless tobacco: Never Used  . Alcohol Use: No   OB History   Grav Para Term Preterm Abortions TAB SAB Ect Mult Living   5 5 3 2  0 0 0 0 0 5     Review of Systems  Constitutional: Negative for fever and chills.  HENT: Negative  for congestion.   Eyes: Positive for visual disturbance (mild blurry). Negative for pain.  Respiratory: Negative for shortness of breath.   Cardiovascular: Negative for chest pain.  Gastrointestinal: Positive for nausea and vomiting. Negative for abdominal pain.  Genitourinary: Negative for dysuria and flank pain.  Musculoskeletal: Negative for neck pain and neck stiffness.  Skin: Negative for rash.  Neurological: Positive for headaches. Negative for seizures, weakness, light-headedness and numbness.    Allergies  Review of patient's allergies indicates no known allergies.  Home Medications   Current Outpatient Rx  Name  Route  Sig   Dispense  Refill  . buPROPion (WELLBUTRIN XL) 150 MG 24 hr tablet   Oral   Take 150 mg by mouth every morning.         Marland Kitchen losartan-hydrochlorothiazide (HYZAAR) 50-12.5 MG per tablet   Oral   Take 1 tablet by mouth every morning.         . sertraline (ZOLOFT) 100 MG tablet   Oral   Take 200 mg by mouth every morning.         . traZODone (DESYREL) 100 MG tablet   Oral   Take 200 mg by mouth at bedtime.          . ondansetron (ZOFRAN ODT) 4 MG disintegrating tablet      4mg  ODT q4 hours prn nausea/vomit   4 tablet   0    BP 139/89  Pulse 101  Temp(Src) 98.7 F (37.1 C) (Oral)  Resp 20  SpO2 100%  LMP 11/04/2013 Physical Exam  Nursing note and vitals reviewed. Constitutional: She is oriented to person, place, and time. She appears well-developed and well-nourished.  HENT:  Head: Normocephalic and atraumatic.  Eyes: Conjunctivae are normal. Right eye exhibits no discharge. Left eye exhibits no discharge.  Neck: Normal range of motion. Neck supple. No tracheal deviation present.  Cardiovascular: Normal rate and regular rhythm.   Pulmonary/Chest: Effort normal and breath sounds normal.  Musculoskeletal: She exhibits no edema.  Neurological: She is alert and oriented to person, place, and time.  5+ strength in UE and LE with f/e at major joints. Sensation to palpation intact in UE and LE. CNs 2-12 grossly intact.  EOMFI.  PERRL.   Finger nose and coordination intact bilateral.   Visual fields intact to finger testing.   Skin: Skin is warm. No rash noted.  Psychiatric: She has a normal mood and affect.    ED Course  Procedures (including critical care time) Labs Review Labs Reviewed - No data to display Imaging Review Ct Head Wo Contrast  11/24/2013   CLINICAL DATA:  Headache with nausea and hypertension  EXAM: CT HEAD WITHOUT CONTRAST  TECHNIQUE: Contiguous axial images were obtained from the base of the skull through the vertex without intravenous contrast.  Study was obtained within 24 hr of patient arrival at the emergency department.  COMPARISON:  None.  FINDINGS: The ventricles are normal in size and configuration. There is no mass, hemorrhage, extra-axial fluid collection, or midline shift. Gray-white compartments are normal. There is no demonstrable acute infarct. Bony calvarium appears intact. The mastoid air cells clear.  IMPRESSION: Study within normal limits.   Electronically Signed   By: Lowella Grip M.D.   On: 11/24/2013 14:08    EKG Interpretation   None       MDM   1. Headache    Clinically tensions HA.  No red flags. CT done due to HTN/ Vomiting and different than normal HA.  No concern for life threatening HA. Improved on recheck. Pt has fup for htn, improved with pain control.  CT no acute findings, reviewed Results and differential diagnosis were discussed with the patient. Close follow up outpatient was discussed, patient comfortable with the plan.   Diagnosis: headache, HTN    Mariea Clonts, MD 11/24/13 1549

## 2014-09-03 ENCOUNTER — Encounter (HOSPITAL_COMMUNITY): Payer: Self-pay | Admitting: Emergency Medicine

## 2015-10-07 ENCOUNTER — Encounter: Payer: Self-pay | Admitting: Gastroenterology

## 2015-10-22 ENCOUNTER — Emergency Department (HOSPITAL_COMMUNITY)
Admission: EM | Admit: 2015-10-22 | Discharge: 2015-10-22 | Disposition: A | Discharge status: Home / Self Care | Payer: Medicaid Other | Attending: Emergency Medicine | Admitting: Emergency Medicine

## 2015-10-22 ENCOUNTER — Encounter (HOSPITAL_COMMUNITY): Payer: Self-pay | Admitting: *Deleted

## 2015-10-22 DIAGNOSIS — I10 Essential (primary) hypertension: Secondary | ICD-10-CM | POA: Diagnosis not present

## 2015-10-22 DIAGNOSIS — Z8659 Personal history of other mental and behavioral disorders: Secondary | ICD-10-CM | POA: Diagnosis not present

## 2015-10-22 DIAGNOSIS — M1990 Unspecified osteoarthritis, unspecified site: Secondary | ICD-10-CM | POA: Diagnosis not present

## 2015-10-22 DIAGNOSIS — Z8719 Personal history of other diseases of the digestive system: Secondary | ICD-10-CM | POA: Diagnosis not present

## 2015-10-22 DIAGNOSIS — Z862 Personal history of diseases of the blood and blood-forming organs and certain disorders involving the immune mechanism: Secondary | ICD-10-CM | POA: Insufficient documentation

## 2015-10-22 DIAGNOSIS — M545 Low back pain: Secondary | ICD-10-CM | POA: Diagnosis present

## 2015-10-22 DIAGNOSIS — M5441 Lumbago with sciatica, right side: Secondary | ICD-10-CM | POA: Diagnosis not present

## 2015-10-22 LAB — CBC WITH DIFFERENTIAL/PLATELET
Basophils Absolute: 0 10*3/uL (ref 0.0–0.1)
Basophils Relative: 0 %
Eosinophils Absolute: 0 10*3/uL (ref 0.0–0.7)
Eosinophils Relative: 1 %
HCT: 35.5 % — ABNORMAL LOW (ref 36.0–46.0)
Hemoglobin: 11 g/dL — ABNORMAL LOW (ref 12.0–15.0)
Lymphocytes Relative: 40 %
Lymphs Abs: 1.5 10*3/uL (ref 0.7–4.0)
MCH: 20.9 pg — ABNORMAL LOW (ref 26.0–34.0)
MCHC: 31 g/dL (ref 30.0–36.0)
MCV: 67.4 fL — ABNORMAL LOW (ref 78.0–100.0)
Monocytes Absolute: 0.3 10*3/uL (ref 0.1–1.0)
Monocytes Relative: 7 %
Neutro Abs: 2 10*3/uL (ref 1.7–7.7)
Neutrophils Relative %: 52 %
Platelets: 188 10*3/uL (ref 150–400)
RBC: 5.27 MIL/uL — ABNORMAL HIGH (ref 3.87–5.11)
RDW: 16 % — ABNORMAL HIGH (ref 11.5–15.5)
WBC: 3.8 10*3/uL — ABNORMAL LOW (ref 4.0–10.5)

## 2015-10-22 LAB — URINE MICROSCOPIC-ADD ON

## 2015-10-22 LAB — COMPREHENSIVE METABOLIC PANEL
ALT: 13 U/L — ABNORMAL LOW (ref 14–54)
AST: 14 U/L — ABNORMAL LOW (ref 15–41)
Albumin: 3.8 g/dL (ref 3.5–5.0)
Alkaline Phosphatase: 66 U/L (ref 38–126)
Anion gap: 6 (ref 5–15)
BUN: 12 mg/dL (ref 6–20)
CO2: 23 mmol/L (ref 22–32)
Calcium: 8.3 mg/dL — ABNORMAL LOW (ref 8.9–10.3)
Chloride: 112 mmol/L — ABNORMAL HIGH (ref 101–111)
Creatinine, Ser: 0.53 mg/dL (ref 0.44–1.00)
GFR calc Af Amer: >60 mL/min (ref >60)
GFR calc non Af Amer: >60 mL/min (ref >60)
Glucose, Bld: 94 mg/dL (ref 65–99)
Potassium: 3.8 mmol/L (ref 3.5–5.1)
Sodium: 141 mmol/L (ref 135–145)
Total Bilirubin: 0.7 mg/dL (ref 0.3–1.2)
Total Protein: 7 g/dL (ref 6.5–8.1)

## 2015-10-22 LAB — URINALYSIS, ROUTINE W REFLEX MICROSCOPIC
Bilirubin Urine: NEGATIVE
Glucose, UA: NEGATIVE mg/dL
Ketones, ur: NEGATIVE mg/dL
Leukocytes, UA: NEGATIVE
Nitrite: NEGATIVE
Protein, ur: 30 mg/dL — AB
Specific Gravity, Urine: 1.024 (ref 1.005–1.030)
pH: 6 (ref 5.0–8.0)

## 2015-10-22 LAB — LIPASE, BLOOD: Lipase: 30 U/L (ref 11–51)

## 2015-10-22 MED ORDER — HYDROCODONE-ACETAMINOPHEN 5-325 MG PO TABS: 2.0000 | ORAL_TABLET | ORAL | Status: DC | PRN

## 2015-10-22 MED ORDER — ONDANSETRON HCL 4 MG PO TABS: 4.0000 mg | ORAL_TABLET | Freq: Four times a day (QID) | ORAL | Status: DC

## 2015-10-22 MED ORDER — KETOROLAC TROMETHAMINE 60 MG/2ML IM SOLN
60.0000 mg | Freq: Once | INTRAMUSCULAR | Status: AC
  Administered 2015-10-22: 60 mg via INTRAMUSCULAR
  Filled 2015-10-22: qty 2

## 2015-10-22 MED ORDER — DEXAMETHASONE SODIUM PHOSPHATE 10 MG/ML IJ SOLN
10.0000 mg | Freq: Once | INTRAMUSCULAR | Status: AC
  Administered 2015-10-22: 10 mg via INTRAMUSCULAR
  Filled 2015-10-22: qty 1

## 2015-10-22 MED ORDER — HYDROCODONE-ACETAMINOPHEN 5-325 MG PO TABS
2.0000 | ORAL_TABLET | Freq: Once | ORAL | Status: AC
Start: 2015-10-22 — End: 2015-10-22
  Administered 2015-10-22: 2 via ORAL
  Filled 2015-10-22: qty 2

## 2015-10-22 NOTE — ED Provider Notes (Signed)
CSN: [id number]     Arrival date & time 10/22/15  [id number] History   First MD Initiated Contact with Patient 10/22/15 1008     Chief Complaint  Patient presents with  . Back Pain    HPI   Deanna Rodgers is a 49 y.o. F PMH significant for arthritis (right knee, s/p TKR) presenting with a 3 day history of lower back pain. She describes her pain as 9/10 pain scale, sharp, radiating down her right leg intermittently, constant, worse with movement. She also endorses nausea/vomiting (4 episodes of NBNB emesis). She has taken 800 mg ibuprofen three times a day since her pain began which has provided minimal relief. She denies fevers, chills, saddle paresthesias, loss of bowel/bladder control.    Past Medical History  Diagnosis Date  . Depression   . History of duodenal ulcer   . History of gastroesophageal reflux (GERD)   . Anemia   . Abnormal Pap smear     bx  . Hypertension   . 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 Pain     LOWER BACK    Past Surgical History  Procedure Laterality Date  . Cesarean section  12-24-11    '85- x1  . Knee arthroscopy  12-24-11    [facility] -[facility]  . Tubal ligation    . Knee arthroplasty  12/25/2011    Procedure: COMPUTER ASSISTED TOTAL KNEE ARTHROPLASTY;  Surgeon: Mcarthur Rossetti, MD;  Location: [facility];  Service: Orthopedics;  Laterality: Right;  . Joint replacement  12/2011    rt total knee  . Knee arthroscopy Right 12/23/2012    Procedure: Right Knee Arthroscopy with minimal Debridement ;  Surgeon: Mcarthur Rossetti, MD;  Location: [facility];  Service: Orthopedics;  Laterality: Right;  Right Knee Arthroscopy with minimal  Debridement   . Hemorroidectomy  yrs ago  . Total knee revision Right 04/14/2013    Procedure: Synovectomy right total knee with poly exchange;  Surgeon: Mcarthur Rossetti, MD;  Location: [facility];  Service:  Orthopedics;  Laterality: Right;  . Total knee revision Right 08/11/2013    Procedure: REVISION RIGHT TOTAL KNEE ARTHROPLASTY;  Surgeon: Mcarthur Rossetti, MD;  Location: [facility];  Service: Orthopedics;  Laterality: Right;   Family History  Problem Relation Age of Onset  . Hypertension Father   . Stroke Father   . Hypertension Maternal Aunt   . Cancer Maternal Uncle   . Cancer Paternal Uncle   . Hypertension Maternal Grandmother   . Stroke Maternal Grandmother    Social History  Substance Use Topics  . Smoking status: Never Smoker   . Smokeless tobacco: Never Used  . Alcohol Use: No   OB History    Gravida Para Term Preterm AB TAB SAB Ectopic Multiple Living   5 5 3 2  0 0 0 0 0 5     Review of Systems  Ten systems are reviewed and are negative for acute change except as noted in the HPI  Allergies  Review of patient's allergies indicates no known allergies.  Home Medications   Prior to Admission medications   Medication Sig Start Date End Date Taking? Authorizing Provider  ibuprofen (ADVIL,MOTRIN) 800 MG tablet Take 800 mg by mouth every 8 (eight) hours as needed (pain).   Yes Historical Provider, MD  HYDROcodone-acetaminophen (NORCO/VICODIN) 5-325 MG tablet Take 2  tablets by mouth every 4 (four) hours as needed. 10/22/15   Lynd Lions, PA-C  ondansetron (ZOFRAN) 4 MG tablet Take 1 tablet (4 mg total) by mouth every 6 (six) hours. 10/22/15   Dougherty Lions, PA-C   BP 163/104 mmHg  Pulse 99  Temp(Src) 98.5 F (36.9 C) (Oral)  Resp 16  Ht 5\' 3"  (1.6 m)  Wt 72.576 kg  BMI 28.35 kg/m2  SpO2 99% Physical Exam  Constitutional: She appears well-developed and well-nourished. No distress.  HENT:  Head: Normocephalic and atraumatic.  Mouth/Throat: Oropharynx is clear and moist. No oropharyngeal exudate.  Eyes: Conjunctivae are normal. Pupils are equal, round, and reactive to light. Right eye exhibits no discharge. Left eye exhibits no discharge. No  scleral icterus.  Neck: No tracheal deviation present.  Cardiovascular: Normal rate, regular rhythm, normal heart sounds and intact distal pulses.  Exam reveals no gallop and no friction rub.   No murmur heard. Pulmonary/Chest: Effort normal and breath sounds normal. No respiratory distress. She has no wheezes. She has no rales. She exhibits no tenderness.  Abdominal: Soft. Bowel sounds are normal. She exhibits no distension and no mass. There is no tenderness. There is no rebound and no guarding.  Musculoskeletal: Normal range of motion. She exhibits tenderness. She exhibits no edema.  Tenderness over SI joints BL  Lymphadenopathy:    She has no cervical adenopathy.  Neurological: She is alert. No cranial nerve deficit. Coordination normal.  Skin: Skin is warm and dry. No rash noted. She is not diaphoretic. No erythema.  Psychiatric: She has a normal mood and affect. Her behavior is normal.  Nursing note and vitals reviewed.   ED Course  Procedures  Labs Review Labs Reviewed  URINALYSIS, ROUTINE W REFLEX MICROSCOPIC (NOT AT [facility]) - Abnormal; Notable for the following:    APPearance CLOUDY (*)    Hgb urine dipstick LARGE (*)    Protein, ur 30 (*)    All other components within normal limits  URINE MICROSCOPIC-ADD ON - Abnormal; Notable for the following:    Squamous Epithelial / LPF 6-30 (*)    Bacteria, UA MANY (*)    All other components within normal limits  CBC WITH DIFFERENTIAL/PLATELET - Abnormal; Notable for the following:    WBC 3.8 (*)    RBC 5.27 (*)    Hemoglobin 11.0 (*)    HCT 35.5 (*)    MCV 67.4 (*)    MCH 20.9 (*)    RDW 16.0 (*)    All other components within normal limits  COMPREHENSIVE METABOLIC PANEL - Abnormal; Notable for the following:    Chloride 112 (*)    Calcium 8.3 (*)    AST 14 (*)    ALT 13 (*)    All other components within normal limits  LIPASE, BLOOD    MDM   Final diagnoses:  Bilateral low back pain with right-sided sciatica    Patient non-toxic appearing and VSS. Most likely sciatica but with vomiting will get basic labs.  UA with contamination, CBC, CMP, and lipase unremarkable.  Upon reevaluation, patient feeling better. Patient may be safely discharged home. Discussed reasons for return. Patient to follow-up with primary care provider within one week. Patient in understanding and agreement with the plan.    Maunabo Lions, PA-C 10/27/15 0151  Davonna Belling, MD 10/28/15 [phone]

## 2015-10-22 NOTE — ED Notes (Signed)
Pt states she began having lower back pain 3 days ago and assumed it was from her period. Pt states she is still having lower back pain and had vomiting x4 yesterday.

## 2015-10-22 NOTE — Discharge Instructions (Signed)
Ms. Deanna Rodgers,  Nice meeting you! Please follow-up with your primary care provider or ortho within two weeks. Return to the emergency department if you develop fevers, chills, are unable to walk, unable to keep foods down. Feel better soon!  S. Wendie Simmer, PA-C

## 2016-05-03 ENCOUNTER — Emergency Department (HOSPITAL_BASED_OUTPATIENT_CLINIC_OR_DEPARTMENT_OTHER): Payer: Medicare Other

## 2016-05-03 ENCOUNTER — Encounter (HOSPITAL_BASED_OUTPATIENT_CLINIC_OR_DEPARTMENT_OTHER): Payer: Self-pay

## 2016-05-03 ENCOUNTER — Emergency Department (HOSPITAL_BASED_OUTPATIENT_CLINIC_OR_DEPARTMENT_OTHER)
Admission: EM | Admit: 2016-05-03 | Discharge: 2016-05-03 | Disposition: A | Discharge status: Home / Self Care | Payer: Medicare Other | Attending: Emergency Medicine | Admitting: Emergency Medicine

## 2016-05-03 DIAGNOSIS — I10 Essential (primary) hypertension: Secondary | ICD-10-CM | POA: Insufficient documentation

## 2016-05-03 DIAGNOSIS — Z79899 Other long term (current) drug therapy: Secondary | ICD-10-CM | POA: Insufficient documentation

## 2016-05-03 DIAGNOSIS — F329 Major depressive disorder, single episode, unspecified: Secondary | ICD-10-CM | POA: Insufficient documentation

## 2016-05-03 DIAGNOSIS — M25561 Pain in right knee: Secondary | ICD-10-CM | POA: Insufficient documentation

## 2016-05-03 MED ORDER — OXYCODONE-ACETAMINOPHEN 5-325 MG PO TABS: 2.0000 | ORAL_TABLET | ORAL | Status: DC | PRN

## 2016-05-03 MED ORDER — OXYCODONE-ACETAMINOPHEN 5-325 MG PO TABS
1.0000 | ORAL_TABLET | Freq: Once | ORAL | Status: AC
  Administered 2016-05-03: 1 via ORAL
  Filled 2016-05-03: qty 1

## 2016-05-03 MED ORDER — LIDOCAINE 5 % EX PTCH: 1.0000 | MEDICATED_PATCH | CUTANEOUS | Status: DC

## 2016-05-03 NOTE — Discharge Instructions (Signed)
Please use medication as directed. Please follow-up with orthopedic specialist if symptoms persist. Return immediately if they worsen.   Joint Pain Joint pain, which is also called arthralgia, can be caused by many things. Joint pain often goes away when you follow your health care provider's instructions for relieving pain at home. However, joint pain can also be caused by conditions that require further treatment. Common causes of joint pain include:  Bruising in the area of the joint.  Overuse of the joint.  Wear and tear on the joints that occur with aging (osteoarthritis).  Various other forms of arthritis.  A buildup of a crystal form of uric acid in the joint (gout).  Infections of the joint (septic arthritis) or of the bone (osteomyelitis). Your health care provider may recommend medicine to help with the pain. If your joint pain continues, additional tests may be needed to diagnose your condition. HOME CARE INSTRUCTIONS Watch your condition for any changes. Follow these instructions as directed to lessen the pain that you are feeling.  Take medicines only as directed by your health care provider.  Rest the affected area for as long as your health care provider says that you should. If directed to do so, raise the painful joint above the level of your heart while you are sitting or lying down.  Do not do things that cause or worsen pain.  If directed, apply ice to the painful area:  Put ice in a plastic bag.  Place a towel between your skin and the bag.  Leave the ice on for 20 minutes, 2-3 times per day.  Wear an elastic bandage, splint, or sling as directed by your health care provider. Loosen the elastic bandage or splint if your fingers or toes become numb and tingle, or if they turn cold and blue.  Begin exercising or stretching the affected area as directed by your health care provider. Ask your health care provider what types of exercise are safe for you.  Keep  all follow-up visits as directed by your health care provider. This is important. SEEK MEDICAL CARE IF:  Your pain increases, and medicine does not help.  Your joint pain does not improve within 3 days.  You have increased bruising or swelling.  You have a fever.  You lose 10 lb (4.5 kg) or more without trying. SEEK IMMEDIATE MEDICAL CARE IF:  You are not able to move the joint.  Your fingers or toes become numb or they turn cold and blue.   This information is not intended to replace advice given to you by your health care provider. Make sure you discuss any questions you have with your health care provider.   Document Released: 10/19/2005 Document Revised: 11/09/2014 Document Reviewed: 07/31/2014 Elsevier Interactive Patient Education [employer].

## 2016-05-03 NOTE — ED Provider Notes (Signed)
CSN: [id number]     Arrival date & time 05/03/16  1922 History  By signing my name below, I, Soijett Blue, attest that this documentation has been prepared under the direction and in the presence of Lenn Sink, PA-C Electronically Signed: Soijett Blue, ED Scribe. 05/03/2016. 8:22 PM.   Chief Complaint  Patient presents with  . Knee Pain      The history is provided by the patient. No language interpreter was used.    Deanna Rodgers is a 50 y.o. female with a medical hx of right knee arthritis, who presents to the Emergency Department complaining of constant, aching, right knee pain onset 3 days. Pt notes that she has been working at [employer] and her right knee pain has worsened due to increased activity levels. Pt reports that there is a burning sensation to her right hip. Denies having burning sensation to right hip in the past with exacerbation of her right knee. Denies recent injury or trauma to the area. Pt states that she has had 7 surgeries to her right knee and that she has had a total knee replacement. Pt initial right knee injury was due to a twisting [place] an [place]. Pt is having associated symptoms of gait problem due to pain. She notes that she has tried tramadol and muscle relaxer with no relief of her symptoms. She denies fever, color change, wound, and any other symptoms.    Past Medical History  Diagnosis Date  . Depression   . History of duodenal ulcer   . History of gastroesophageal reflux (GERD)   . Anemia   . Abnormal Pap smear     bx  . Hypertension   . 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 Pain     LOWER BACK    Past Surgical History  Procedure Laterality Date  . Cesarean section  12-24-11    '85- x1  . Knee arthroscopy  12-24-11    [facility] -[facility]  . Tubal ligation    . Knee arthroplasty  12/25/2011    Procedure: COMPUTER  ASSISTED TOTAL KNEE ARTHROPLASTY;  Surgeon: Mcarthur Rossetti, MD;  Location: [facility];  Service: Orthopedics;  Laterality: Right;  . Joint replacement  12/2011    rt total knee  . Knee arthroscopy Right 12/23/2012    Procedure: Right Knee Arthroscopy with minimal Debridement ;  Surgeon: Mcarthur Rossetti, MD;  Location: [facility];  Service: Orthopedics;  Laterality: Right;  Right Knee Arthroscopy with minimal  Debridement   . Hemorroidectomy  yrs ago  . Total knee revision Right 04/14/2013    Procedure: Synovectomy right total knee with poly exchange;  Surgeon: Mcarthur Rossetti, MD;  Location: [facility];  Service: Orthopedics;  Laterality: Right;  . Total knee revision Right 08/11/2013    Procedure: REVISION RIGHT TOTAL KNEE ARTHROPLASTY;  Surgeon: Mcarthur Rossetti, MD;  Location: [facility];  Service: Orthopedics;  Laterality: Right;   Family History  Problem Relation Age of Onset  . Hypertension Father   . Stroke Father   . Hypertension Maternal Aunt   . Cancer Maternal Uncle   . Cancer Paternal Uncle   . Hypertension Maternal Grandmother   . Stroke Maternal Grandmother    Social History  Substance Use Topics  . Smoking status: Never Smoker   . Smokeless tobacco: Never Used  .  Alcohol Use: No   OB History    Gravida Para Term Preterm AB TAB SAB Ectopic Multiple Living   5 5 3 2  0 0 0 0 0 5     Review of Systems  Constitutional: Negative for fever.  Musculoskeletal: Positive for arthralgias (right knee) and gait problem (due to pain). Negative for joint swelling.  Skin: Negative for color change, rash and wound.     Allergies  Review of patient's allergies indicates no known allergies.  Home Medications   Prior to Admission medications   Medication Sig Start Date End Date Taking? Authorizing Provider  clonazePAM (KLONOPIN) 0.5 MG tablet Take 0.5 mg by mouth 2 (two) times daily as needed for anxiety.   Yes Historical Provider, MD  cyclobenzaprine (FLEXERIL) 10  MG tablet Take 10 mg by mouth 3 (three) times daily as needed for muscle spasms.   Yes Historical Provider, MD  losartan-hydrochlorothiazide (HYZAAR) 50-12.5 MG tablet Take 1 tablet by mouth daily.   Yes Historical Provider, MD  HYDROcodone-acetaminophen (NORCO/VICODIN) 5-325 MG tablet Take 2 tablets by mouth every 4 (four) hours as needed. 10/22/15   Monterey Lions, PA-C  oxyCODONE-acetaminophen (PERCOCET/ROXICET) 5-325 MG tablet Take 2 tablets by mouth every 4 (four) hours as needed for severe pain. 05/03/16   Meckenzie Balsley, PA-C   BP 142/101 mmHg  Pulse 89  Temp(Src) 98.4 F (36.9 C) (Oral)  Resp 18  Ht 5\' 3"  (1.6 m)  Wt 73.936 kg  BMI 28.88 kg/m2  SpO2 100%  LMP 04/27/2016 Physical Exam  Constitutional: She is oriented to person, place, and time. She appears well-developed and well-nourished. No distress.  HENT:  Head: Normocephalic and atraumatic.  Eyes: EOM are normal.  Neck: Neck supple.  Cardiovascular: Normal rate.   Pulmonary/Chest: Effort normal. No respiratory distress.  Abdominal: She exhibits no distension.  Musculoskeletal: Normal range of motion.       Right knee: She exhibits no effusion and no erythema. Tenderness found.  Right knee with linear surgical scar from total knee with no signs of infection. No warmth to touch, redness, or obvious joint effusion. Diffuse TTP. Difficult to range due to pain. Flexion intact. Distal sensation intact. Motor intact.   Neurological: She is alert and oriented to person, place, and time. No sensory deficit.  Skin: Skin is warm and dry.  Psychiatric: She has a normal mood and affect. Her behavior is normal.  Nursing note and vitals reviewed.   ED Course  Procedures (including critical care time) DIAGNOSTIC STUDIES: Oxygen Saturation is 100% on RA, nl by my interpretation.    COORDINATION OF CARE: 8:16 PM Discussed treatment plan with pt at bedside which includes right knee xray, percocet Rx, follow up with orthopedist,  and pt agreed to plan.   Imaging Review Dg Knee Complete 4 Views Right  05/03/2016  CLINICAL DATA:  Right knee pain 3 days. Multiple previous right knee surgeries. EXAM: RIGHT KNEE - COMPLETE 4+ VIEW COMPARISON:  10/25/2013 FINDINGS: Right total knee arthroplasty unchanged. No acute fracture or dislocation. Remainder of the exam is unchanged. IMPRESSION: No acute findings. Right knee arthroplasty unchanged. Electronically Signed   By: Marin Olp M.D.   On: 05/03/2016 20:07   I have personally reviewed and evaluated these images as part of my medical decision-making.   MDM   Final diagnoses:  Right knee pain    Labs:   Imaging: right knee xray: No acute findings. Right knee arthroplasty unchanged.  Consults:   Therapeutics: percocet  Discharge  Meds: percocet Rx  Assessment/Plan: Patient presents with acute on chronic right knee pain. She has significant past medical history of surgical intervention. Patient has no signs of infectious etiology on today's exam, no trauma. Patient X-Ray negative for obvious fracture or dislocation.  Pt advised to follow up with orthopedics. Will treat with percocet while in the ED. Will discharge home with percocet Rx. Conservative therapy recommended and discussed. Patient will be discharged home & is agreeable with above plan. Returns precautions discussed. Pt appears safe for discharge.      Okey Regal, PA-C 05/03/16 2032  Julianne Rice, MD 05/05/16 [phone]

## 2016-05-03 NOTE — ED Notes (Signed)
Per pt report is out of lidocaine patch that she needs prescription for that she wears on that knee.

## 2016-05-03 NOTE — ED Notes (Signed)
MD at bedside. 

## 2016-05-03 NOTE — ED Notes (Signed)
Right knee pain x 3 days-denies injury-multiple surgeries to knee-NAD-steady gait

## 2016-06-12 ENCOUNTER — Other Ambulatory Visit: Payer: Self-pay | Admitting: Internal Medicine

## 2016-06-12 DIAGNOSIS — Z1231 Encounter for screening mammogram for malignant neoplasm of breast: Secondary | ICD-10-CM

## 2016-06-22 ENCOUNTER — Ambulatory Visit: Payer: Self-pay

## 2016-08-04 ENCOUNTER — Emergency Department (HOSPITAL_COMMUNITY)
Admission: EM | Admit: 2016-08-04 | Discharge: 2016-08-04 | Disposition: A | Discharge status: Home / Self Care | Payer: Medicare Other | Attending: Emergency Medicine | Admitting: Emergency Medicine

## 2016-08-04 ENCOUNTER — Encounter (HOSPITAL_COMMUNITY): Payer: Self-pay | Admitting: Emergency Medicine

## 2016-08-04 DIAGNOSIS — I10 Essential (primary) hypertension: Secondary | ICD-10-CM | POA: Insufficient documentation

## 2016-08-04 DIAGNOSIS — Z96651 Presence of right artificial knee joint: Secondary | ICD-10-CM | POA: Insufficient documentation

## 2016-08-04 DIAGNOSIS — M5410 Radiculopathy, site unspecified: Secondary | ICD-10-CM | POA: Insufficient documentation

## 2016-08-04 MED ORDER — PREDNISONE 20 MG PO TABS: 40.0000 mg | ORAL_TABLET | Freq: Every day | ORAL | 0 refills | Status: DC

## 2016-08-04 NOTE — ED Provider Notes (Signed)
[facility] DEPT Provider Note   CSN: [id number] Arrival date & time: 08/04/16  [id number]     History   Chief Complaint Chief Complaint  Patient presents with  . Back Pain    HPI Deanna Rodgers is a 50 y.o. female.  Patient presents to the ED with a chief complaint of back and neck pain.  She states that the symptoms started yesterday. She states that the symptoms run from her neck and mid back down her right leg. She states that it only hurts when she is bending. She denies any bowel or bladder incontinence. Denies any fevers chills. Denies any IV drug use. Denies any history of cancer. She denies weakness, numbness, or tingling. She has tried taking a muscle relaxer with minimal relief. There are no other associated symptoms.  She denies any trauma.   The history is provided by the patient. No language interpreter was used.    Past Medical History:  Diagnosis Date  . Abnormal Pap smear    bx  . Anemia   . 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 Depression   . History of duodenal ulcer   . History of gastroesophageal reflux (GERD)   . Hypertension   . Pain    LOWER BACK     Patient Active Problem List   Diagnosis Date Noted  . 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 Painful total knee replacement ([place]) 04/14/2013  . Synovitis of knee 12/23/2012  . Hypertension 10/16/2012  . Overdose 10/15/2012  . Degenerative arthritis of knee 12/25/2011  . HEMORRHOIDS-INTERNAL 10/14/2010  . HEMORRHOIDS-EXTERNAL 10/14/2010  . GERD 10/14/2010  . ULCER-DUODENAL 10/14/2010  . Irritable bowel syndrome 10/14/2010  . ACUT DUODEN ULCER W/PERF W/O MENTION OBSTRUCTION 10/28/2009  . IRON DEFICIENCY 08/08/2009  . DYSPEPSIA&OTHER [place] DISORDERS FUNCTION STOMACH 08/08/2009  . RECTAL BLEEDING 08/08/2009    Past Surgical History:  Procedure Laterality Date  . CESAREAN SECTION   12-24-11   '85- x1  . hemorroidectomy  yrs ago  . JOINT REPLACEMENT  12/2011   rt total knee  . KNEE ARTHROPLASTY  12/25/2011   Procedure: COMPUTER ASSISTED TOTAL KNEE ARTHROPLASTY;  Surgeon: Mcarthur Rossetti, MD;  Location: [facility];  Service: Orthopedics;  Laterality: Right;  . KNEE ARTHROSCOPY  12-24-11   x2 -[facility] 12[facility]12[facility]  . KNEE ARTHROSCOPY Right 12/23/2012   Procedure: Right Knee Arthroscopy with minimal Debridement ;  Surgeon: Mcarthur Rossetti, MD;  Location: [facility];  Service: Orthopedics;  Laterality: Right;  Right Knee Arthroscopy with minimal  Debridement   . TOTAL KNEE REVISION Right 04/14/2013   Procedure: Synovectomy right total knee with poly exchange;  Surgeon: Mcarthur Rossetti, MD;  Location: [facility];  Service: Orthopedics;  Laterality: Right;  . TOTAL KNEE REVISION Right 08/11/2013   Procedure: REVISION RIGHT TOTAL KNEE ARTHROPLASTY;  Surgeon: Mcarthur Rossetti, MD;  Location: [facility];  Service: Orthopedics;  Laterality: Right;  . TUBAL LIGATION      OB History    Gravida Para Term Preterm AB Living   5 5 3 2  0 5   SAB TAB Ectopic Multiple Live Births   0 0 0 0         Home Medications    Prior to Admission medications   Medication Sig Start Date End Date Taking? Authorizing Provider  clonazePAM (KLONOPIN) 0.5 MG tablet Take 0.5  mg by mouth 2 (two) times daily as needed for anxiety.    Historical Provider, MD  cyclobenzaprine (FLEXERIL) 10 MG tablet Take 10 mg by mouth 3 (three) times daily as needed for muscle spasms.    Historical Provider, MD  HYDROcodone-acetaminophen (NORCO/VICODIN) 5-325 MG tablet Take 2 tablets by mouth every 4 (four) hours as needed. 10/22/15   [place] Lions, PA-C  lidocaine (LIDODERM) 5 % Place 1 patch onto the skin daily. Remove & Discard patch within 12 hours or as directed by MD 05/03/16   Okey Regal, PA-C  losartan-hydrochlorothiazide (HYZAAR) 50-12.5 MG tablet Take 1 tablet by mouth daily.     Historical Provider, MD  oxyCODONE-acetaminophen (PERCOCET/ROXICET) 5-325 MG tablet Take 2 tablets by mouth every 4 (four) hours as needed for severe pain. 05/03/16   Okey Regal, PA-C    Family History Family History  Problem Relation Age of Onset  . Hypertension Maternal Grandmother   . Stroke Maternal Grandmother   . Hypertension Father   . Stroke Father   . Hypertension Maternal Aunt   . Cancer Maternal Uncle   . Cancer Paternal Uncle     Social History Social History  Substance Use Topics  . Smoking status: Never Smoker  . Smokeless tobacco: Never Used  . Alcohol use No     Allergies   Review of patient's allergies indicates no known allergies.   Review of Systems Review of Systems  Constitutional: Negative for chills and fever.  Gastrointestinal:       No bowel incontinence  Genitourinary:       No urinary incontinence  Musculoskeletal: Positive for arthralgias, back pain and myalgias.  Neurological:       No saddle anesthesia  All other systems reviewed and are negative.    Physical Exam Updated Vital Signs BP 131/93 (BP Location: Left Arm)   Pulse 109   Temp 98.7 F (37.1 C) (Oral)   Resp 20   LMP 07/17/2016   SpO2 99%   Physical Exam Physical Exam  Constitutional: Pt appears well-developed and well-nourished. No distress.  HENT:  Head: Normocephalic and atraumatic.  Mouth/Throat: Oropharynx is clear and moist. No oropharyngeal exudate.  Eyes: Conjunctivae are normal.  Neck: Normal range of motion. Neck supple.  No meningismus Cardiovascular: Normal rate, regular rhythm and intact distal pulses.   Pulmonary/Chest: Effort normal and breath sounds normal. No respiratory distress. Pt has no wheezes.  Abdominal: Pt exhibits no distension Musculoskeletal:  Cervical and lumbar paraspinal muscles tender to palpation, no bony CTLS spine tenderness, deformity, step-off, or crepitus Lymphadenopathy: Pt has no cervical adenopathy.  Neurological: Pt is  alert and oriented Speech is clear and goal oriented, follows commands Normal 5/5 strength in upper and lower extremities bilaterally Sensation intact Moves extremities without ataxia, coordination intact Normal gait Normal balance Skin: Skin is warm and dry. No rash noted. Pt is not diaphoretic. No erythema.  Psychiatric: Pt has a normal mood and affect. Behavior is normal.  Nursing note and vitals reviewed.   ED Treatments / Results  Labs (all labs ordered are listed, but only abnormal results are displayed) Labs Reviewed - No data to display  EKG  EKG Interpretation None       Radiology No results found.  Procedures Procedures (including critical care time)  Medications Ordered in ED Medications - No data to display   Initial Impression / Assessment and Plan / ED Course  I have reviewed the triage vital signs and the nursing notes.  Pertinent  labs & imaging results that were available during my care of the patient were reviewed by me and considered in my medical decision making (see chart for details).  Clinical Course    Patient with back pain.  No neurological deficits and normal neuro exam.  Patient is ambulatory.  No loss of bowel or bladder control.  Doubt cauda equina.  Denies fever,  doubt epidural abscess or other lesion. Recommend back exercises, stretching, RICE, and will treat with a short course of prednisone  Encouraged the patient that there could be a need for additional workup and/or imaging such as MRI, if the symptoms do not resolve. Patient advised that if the back pain does not resolve, or radiates, this could progress to more serious conditions and is encouraged to follow-up with PCP or orthopedics within 2 weeks.     Final Clinical Impressions(s) / ED Diagnoses   Final diagnoses:  Radiculopathy, unspecified spinal region    New Prescriptions New Prescriptions   PREDNISONE (DELTASONE) 20 MG TABLET    Take 2 tablets (40 mg total) by mouth  daily.     Montine Circle, PA-C 08/04/16 1727    Milton Ferguson, MD 08/04/16 2312

## 2016-08-04 NOTE — ED Triage Notes (Signed)
Pt states "my lower back and and neck hurt and it travels all the way down to my right leg." stated if she bends her neck down to her chin it pulls in her back. Pt ambulatory. States it started yesterday. Denies injury.

## 2017-01-12 ENCOUNTER — Encounter (HOSPITAL_COMMUNITY): Payer: Self-pay

## 2017-01-12 DIAGNOSIS — Z96651 Presence of right artificial knee joint: Secondary | ICD-10-CM | POA: Insufficient documentation

## 2017-01-12 DIAGNOSIS — Z79899 Other long term (current) drug therapy: Secondary | ICD-10-CM | POA: Insufficient documentation

## 2017-01-12 DIAGNOSIS — I10 Essential (primary) hypertension: Secondary | ICD-10-CM | POA: Insufficient documentation

## 2017-01-12 DIAGNOSIS — M545 Low back pain: Secondary | ICD-10-CM | POA: Insufficient documentation

## 2017-01-12 LAB — URINALYSIS, ROUTINE W REFLEX MICROSCOPIC
Bilirubin Urine: NEGATIVE
Glucose, UA: NEGATIVE mg/dL
Hgb urine dipstick: NEGATIVE
Ketones, ur: NEGATIVE mg/dL
Leukocytes, UA: NEGATIVE
Nitrite: NEGATIVE
Protein, ur: NEGATIVE mg/dL
Specific Gravity, Urine: 1.018 (ref 1.005–1.030)
pH: 5 (ref 5.0–8.0)

## 2017-01-12 MED ORDER — OXYCODONE-ACETAMINOPHEN 5-325 MG PO TABS
1.0000 | ORAL_TABLET | ORAL | Status: DC | PRN
  Administered 2017-01-12: 1 via ORAL
  Filled 2017-01-12: qty 1

## 2017-01-12 NOTE — ED Triage Notes (Signed)
Pt c/o bilateral flank pain. She states that she was dx'd with kidney stones on Fri. She was given pain medication, but has not since passed the stones and is out of medication. She denies urinary symptoms. A&Ox4. Ambulatory.

## 2017-01-13 ENCOUNTER — Emergency Department (HOSPITAL_COMMUNITY)
Admission: EM | Admit: 2017-01-13 | Discharge: 2017-01-13 | Disposition: A | Discharge status: Home / Self Care | Payer: Medicare Other | Attending: Emergency Medicine | Admitting: Emergency Medicine

## 2017-01-13 DIAGNOSIS — M545 Low back pain: Secondary | ICD-10-CM

## 2017-01-13 LAB — PREGNANCY, URINE: Preg Test, Ur: NEGATIVE

## 2017-01-13 MED ORDER — DEXAMETHASONE SODIUM PHOSPHATE 10 MG/ML IJ SOLN
10.0000 mg | Freq: Once | INTRAMUSCULAR | Status: AC
  Administered 2017-01-13: 10 mg via INTRAMUSCULAR
  Filled 2017-01-13: qty 1

## 2017-01-13 MED ORDER — ONDANSETRON 8 MG PO TBDP
8.0000 mg | ORAL_TABLET | Freq: Once | ORAL | Status: AC
  Administered 2017-01-13: 8 mg via ORAL
  Filled 2017-01-13: qty 1

## 2017-01-13 MED ORDER — BACLOFEN 10 MG PO TABS: 10.0000 mg | ORAL_TABLET | Freq: Three times a day (TID) | ORAL | 0 refills | Status: DC

## 2017-01-13 MED ORDER — HYDROMORPHONE HCL 1 MG/ML IJ SOLN
2.0000 mg | Freq: Once | INTRAMUSCULAR | Status: AC
  Administered 2017-01-13: 2 mg via INTRAMUSCULAR
  Filled 2017-01-13: qty 2

## 2017-01-13 MED ORDER — MELOXICAM 15 MG PO TABS: 15.0000 mg | ORAL_TABLET | Freq: Every day | ORAL | 0 refills | Status: DC

## 2017-01-13 NOTE — ED Provider Notes (Signed)
[facility] DEPT Provider Note   CSN: [id number] Arrival date & time: 01/12/17  2017     History   Chief Complaint Chief Complaint  Patient presents with  . Flank Pain    HPI Deanna Rodgers is a 51 y.o. female to the emergency department with chief complaint of flank pain. Patient states that she has bilateral flank pain and lower back pain that radiates into her hips and wraps around to the front thigh of both legs. It does not go past her knee. According to the EMR. She has a history of previous visits for low back pain and radiculopathy. The patient was seen  At an ER in [place] , on 01/08/2017. She is told that she had kidney stones. She was given hydrocodone but has run out of medication. She presents the ER for continued severe pain. She denies any weakness in the lower extremities, saddle anesthesia, loss of control of her bowel or bladder.  HPI  Past Medical History:  Diagnosis Date  . Abnormal Pap smear    bx  . Anemia   . 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 Depression   . History of duodenal ulcer   . History of gastroesophageal reflux (GERD)   . Hypertension   . Pain    LOWER BACK     Patient Active Problem List   Diagnosis Date Noted  . 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 Painful total knee replacement ([place]) 04/14/2013  . Synovitis of knee 12/23/2012  . Hypertension 10/16/2012  . Overdose 10/15/2012  . Degenerative arthritis of knee 12/25/2011  . HEMORRHOIDS-INTERNAL 10/14/2010  . HEMORRHOIDS-EXTERNAL 10/14/2010  . GERD 10/14/2010  . ULCER-DUODENAL 10/14/2010  . Irritable bowel syndrome 10/14/2010  . ACUT DUODEN ULCER W/PERF W/O MENTION OBSTRUCTION 10/28/2009  . IRON DEFICIENCY 08/08/2009  . DYSPEPSIA&OTHER [place] DISORDERS FUNCTION STOMACH 08/08/2009  . RECTAL BLEEDING 08/08/2009    Past Surgical History:    Procedure Laterality Date  . CESAREAN SECTION  12-24-11   '85- x1  . hemorroidectomy  yrs ago  . JOINT REPLACEMENT  12/2011   rt total knee  . KNEE ARTHROPLASTY  12/25/2011   Procedure: COMPUTER ASSISTED TOTAL KNEE ARTHROPLASTY;  Surgeon: Mcarthur Rossetti, MD;  Location: [facility];  Service: Orthopedics;  Laterality: Right;  . KNEE ARTHROSCOPY  12-24-11   x2 -[facility] 12[facility]  . KNEE ARTHROSCOPY Right 12/23/2012   Procedure: Right Knee Arthroscopy with minimal Debridement ;  Surgeon: Mcarthur Rossetti, MD;  Location: [facility];  Service: Orthopedics;  Laterality: Right;  Right Knee Arthroscopy with minimal  Debridement   . TOTAL KNEE REVISION Right 04/14/2013   Procedure: Synovectomy right total knee with poly exchange;  Surgeon: Mcarthur Rossetti, MD;  Location: [facility];  Service: Orthopedics;  Laterality: Right;  . TOTAL KNEE REVISION Right 08/11/2013   Procedure: REVISION RIGHT TOTAL KNEE ARTHROPLASTY;  Surgeon: Mcarthur Rossetti, MD;  Location: [facility];  Service: Orthopedics;  Laterality: Right;  . TUBAL LIGATION      OB History    Gravida Para Term Preterm AB Living   5 5 3 2  0 5   SAB TAB Ectopic Multiple Live Births   0 0 0 0         Home Medications    Prior to Admission medications   Medication Sig Start Date End Date Taking? Authorizing  Provider  HYDROcodone-acetaminophen (NORCO/VICODIN) 5-325 MG tablet Take 2 tablets by mouth every 4 (four) hours as needed. Patient not taking: Reported on 01/13/2017 10/22/15   [place] Lions, PA-C  lidocaine (LIDODERM) 5 % Place 1 patch onto the skin daily. Remove & Discard patch within 12 hours or as directed by MD Patient not taking: Reported on 01/13/2017 05/03/16   Okey Regal, PA-C  oxyCODONE-acetaminophen (PERCOCET/ROXICET) 5-325 MG tablet Take 2 tablets by mouth every 4 (four) hours as needed for severe pain. Patient not taking: Reported on 01/13/2017 05/03/16   Okey Regal, PA-C  predniSONE  (DELTASONE) 20 MG tablet Take 2 tablets (40 mg total) by mouth daily. Patient not taking: Reported on 01/13/2017 08/04/16   Montine Circle, PA-C    Family History Family History  Problem Relation Age of Onset  . Hypertension Maternal Grandmother   . Stroke Maternal Grandmother   . Hypertension Father   . Stroke Father   . Hypertension Maternal Aunt   . Cancer Maternal Uncle   . Cancer Paternal Uncle     Social History Social History  Substance Use Topics  . Smoking status: Never Smoker  . Smokeless tobacco: Never Used  . Alcohol use No     Allergies   Patient has no known allergies.   Review of Systems Review of Systems  Ten systems reviewed and are negative for acute change, except as noted in the HPI.   Physical Exam Updated Vital Signs BP (!) 167/113 (BP Location: Left Arm)   Pulse 97   Temp 98.5 F (36.9 C) (Oral)   Resp 20   Wt 72.6 kg   SpO2 100%   BMI 28.34 kg/m   Physical Exam  Constitutional: She is oriented to person, place, and time. She appears well-developed and well-nourished. No distress.  HENT:  Head: Normocephalic and atraumatic.  Eyes: Conjunctivae are normal. No scleral icterus.  Neck: Normal range of motion.  Cardiovascular: Normal rate, regular rhythm and normal heart sounds.  Exam reveals no gallop and no friction rub.   No murmur heard. Pulmonary/Chest: Effort normal and breath sounds normal. No respiratory distress.  Abdominal: Soft. Bowel sounds are normal. She exhibits no distension and no mass. There is no tenderness. There is no guarding.  Musculoskeletal:       Back:  Neurological: She is alert and oriented to person, place, and time.  Reflex Scores:      Patellar reflexes are 2+ on the right side and 2+ on the left side. Sensation equal bilaterally. Normal strength with flexion and extension at the hip. Normal plantar and dorsiflexion of the foot. Strong pulses bilaterally.  Skin: Skin is warm and dry. She is not diaphoretic.   Psychiatric: Her behavior is normal.  Nursing note and vitals reviewed.    ED Treatments / Results  Labs (all labs ordered are listed, but only abnormal results are displayed) Labs Reviewed  URINALYSIS, ROUTINE W REFLEX MICROSCOPIC  PREGNANCY, URINE    EKG  EKG Interpretation None       Radiology No results found.  Procedures Procedures (including critical care time)  Medications Ordered in ED Medications  oxyCODONE-acetaminophen (PERCOCET/ROXICET) 5-325 MG per tablet 1 tablet (1 tablet Oral Given 01/12/17 2047)  HYDROmorphone (DILAUDID) injection 2 mg (not administered)  dexamethasone (DECADRON) injection 10 mg (not administered)  ondansetron (ZOFRAN-ODT) disintegrating tablet 8 mg (8 mg Oral Given 01/13/17 0541)     Initial Impression / Assessment and Plan / ED Course  I have reviewed the triage  vital signs and the nursing notes.  Pertinent labs & imaging results that were available during my care of the patient were reviewed by me and considered in my medical decision making (see chart for details).     Patient urine is negative. I feel that the patient is having radiculopathy. Patient given an IM Dilaudid and Decadron here in the emergency department. She will be discharged with nonnarcotic pain medicine, and baclofen. Patient states that she does not have a PCP, however, it appears that she is assigned to Dr. Warren Danes. She has been released from the practice. I am having her follow up with him. The patient is advised to follow-up with her PCP.  Final Clinical Impressions(s) / ED Diagnoses   Final diagnoses:  None    New Prescriptions New Prescriptions   No medications on file     Margarita Mail, PA-C 01/13/17 0559    Shanon Rosser, MD 01/13/17 [phone]

## 2017-01-13 NOTE — Discharge Instructions (Signed)
SEEK IMMEDIATE MEDICAL ATTENTION IF: New numbness, tingling, weakness, or problem with the use of your arms or legs.  Severe back pain not relieved with medications.  Change in bowel or bladder control.  Increasing pain in any areas of the body (such as chest or abdominal pain).  Shortness of breath, dizziness or fainting.  Nausea (feeling sick to your stomach), vomiting, fever, or sweats.  

## 2017-01-13 NOTE — ED Notes (Signed)
Bed: OA41 Expected date:  Expected time:  Means of arrival:  Comments: TR

## 2020-01-10 ENCOUNTER — Encounter (HOSPITAL_COMMUNITY): Payer: Self-pay | Admitting: Emergency Medicine

## 2020-01-10 ENCOUNTER — Other Ambulatory Visit: Payer: Self-pay

## 2020-01-10 ENCOUNTER — Ambulatory Visit (HOSPITAL_COMMUNITY)
Admission: EM | Admit: 2020-01-10 | Discharge: 2020-01-10 | Disposition: A | Discharge status: Home / Self Care | Payer: Medicaid Other | Attending: Internal Medicine | Admitting: Internal Medicine

## 2020-01-10 DIAGNOSIS — M5442 Lumbago with sciatica, left side: Secondary | ICD-10-CM | POA: Diagnosis not present

## 2020-01-10 DIAGNOSIS — M5441 Lumbago with sciatica, right side: Secondary | ICD-10-CM

## 2020-01-10 MED ORDER — KETOROLAC TROMETHAMINE 30 MG/ML IJ SOLN
INTRAMUSCULAR | Status: AC
  Filled 2020-01-10: qty 1

## 2020-01-10 MED ORDER — CYCLOBENZAPRINE HCL 10 MG PO TABS: 10.0000 mg | ORAL_TABLET | Freq: Three times a day (TID) | ORAL | 0 refills | Status: DC | PRN

## 2020-01-10 MED ORDER — NAPROXEN 375 MG PO TABS: 375.0000 mg | ORAL_TABLET | Freq: Two times a day (BID) | ORAL | 0 refills | Status: DC

## 2020-01-10 MED ORDER — PREDNISONE 20 MG PO TABS: 20.0000 mg | ORAL_TABLET | Freq: Every day | ORAL | 0 refills | Status: AC

## 2020-01-10 MED ORDER — KETOROLAC TROMETHAMINE 30 MG/ML IJ SOLN
30.0000 mg | Freq: Once | INTRAMUSCULAR | Status: AC
  Administered 2020-01-10: 12:00:00 30 mg via INTRAMUSCULAR

## 2020-01-10 MED ORDER — DICLOFENAC SODIUM 1 % EX GEL: 4.0000 g | Freq: Four times a day (QID) | CUTANEOUS | 0 refills | Status: DC

## 2020-01-10 NOTE — ED Triage Notes (Signed)
Lower back pain and bilateral leg pain that started one week ago..  No known injury

## 2020-01-10 NOTE — ED Provider Notes (Signed)
[place]    CSN: [id number] Arrival date & time: 01/10/20  1003      History   Chief Complaint Chief Complaint  Patient presents with  . Back Pain    HPI Deanna Rodgers is a 54 y.o. female with history of left knee replacement and recurrent back pain comes to the urgent care for back pain which started about 1 week ago. Patient denies any preceding event. No trauma, falls or heavy lifting. Patient describes pain as 8/10, throbbing and severe. Pain radiates to the thighs and associated with some tingling sensation on the lateral aspect of the thigh.No weakness in the legs. No buckling of the knees.  Pain is aggravated by movement and relieved by standing. Denies perineal numbness, urin eo r bowle continence problems.  HPI  Past Medical History:  Diagnosis Date  . Abnormal Pap smear    bx  . Anemia   . 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 Depression   . History of duodenal ulcer   . History of gastroesophageal reflux (GERD)   . Hypertension   . Pain    LOWER BACK     Patient Active Problem List   Diagnosis Date Noted  . 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 Painful total knee replacement ([facility]) 04/14/2013  . Synovitis of knee 12/23/2012  . Hypertension 10/16/2012  . Overdose 10/15/2012  . Degenerative arthritis of knee 12/25/2011  . HEMORRHOIDS-INTERNAL 10/14/2010  . HEMORRHOIDS-EXTERNAL 10/14/2010  . GERD 10/14/2010  . ULCER-DUODENAL 10/14/2010  . Irritable bowel syndrome 10/14/2010  . ACUT DUODEN ULCER W/PERF W/O MENTION OBSTRUCTION 10/28/2009  . IRON DEFICIENCY 08/08/2009  . DYSPEPSIA&OTHER [place] DISORDERS FUNCTION STOMACH 08/08/2009  . RECTAL BLEEDING 08/08/2009    Past Surgical History:  Procedure Laterality Date  . CESAREAN SECTION  12-24-11   '85- x1  . hemorroidectomy  yrs ago  . JOINT REPLACEMENT  12/2011   rt total knee  .  KNEE ARTHROPLASTY  12/25/2011   Procedure: COMPUTER ASSISTED TOTAL KNEE ARTHROPLASTY;  Surgeon: Mcarthur Rossetti, MD;  Location: [facility];  Service: Orthopedics;  Laterality: Right;  . KNEE ARTHROSCOPY  12-24-11   x2 -[facility] 12[facility]  . KNEE ARTHROSCOPY Right 12/23/2012   Procedure: Right Knee Arthroscopy with minimal Debridement ;  Surgeon: Mcarthur Rossetti, MD;  Location: [facility];  Service: Orthopedics;  Laterality: Right;  Right Knee Arthroscopy with minimal  Debridement   . TOTAL KNEE REVISION Right 04/14/2013   Procedure: Synovectomy right total knee with poly exchange;  Surgeon: Mcarthur Rossetti, MD;  Location: [facility];  Service: Orthopedics;  Laterality: Right;  . TOTAL KNEE REVISION Right 08/11/2013   Procedure: REVISION RIGHT TOTAL KNEE ARTHROPLASTY;  Surgeon: Mcarthur Rossetti, MD;  Location: [facility];  Service: Orthopedics;  Laterality: Right;  . TUBAL LIGATION      OB History    Gravida  5   Para  5   Term  3   Preterm  2   AB  0   Living  5     SAB  0   TAB  0   Ectopic  0   Multiple  0   Live Births               Home Medications    Prior to Admission medications   Medication Sig Start Date End Date  Taking? Authorizing Provider  cyclobenzaprine (FLEXERIL) 10 MG tablet Take 1 tablet (10 mg total) by mouth 3 (three) times daily as needed for muscle spasms. 01/10/20   Latrel Szymczak, Myrene Galas, MD  diclofenac Sodium (VOLTAREN) 1 % GEL Apply 4 g topically 4 (four) times daily. 01/10/20   Chase Picket, MD  naproxen (NAPROSYN) 375 MG tablet Take 1 tablet (375 mg total) by mouth 2 (two) times daily. 01/10/20   Tyreka Henneke, Myrene Galas, MD  predniSONE (DELTASONE) 20 MG tablet Take 1 tablet (20 mg total) by mouth daily for 5 days. 01/10/20 01/15/20  Chase Picket, MD    Family History Family History  Problem Relation Age of Onset  . Hypertension Maternal Grandmother   . Stroke Maternal Grandmother   . Hypertension Father   . Stroke Father    . Hypertension Maternal Aunt   . Cancer Maternal Uncle   . Cancer Paternal Uncle     Social History Social History   Tobacco Use  . Smoking status: Never Smoker  . Smokeless tobacco: Never Used  Substance Use Topics  . Alcohol use: No  . Drug use: No     Allergies   Patient has no known allergies.   Review of Systems Review of Systems  Constitutional: Positive for activity change. Negative for unexpected weight change.  Gastrointestinal: Negative for nausea and vomiting.  Genitourinary: Negative for difficulty urinating and pelvic pain.  Musculoskeletal: Positive for arthralgias, back pain and joint swelling. Negative for gait problem and myalgias.  Skin: Negative.   Neurological: Negative for dizziness, light-headedness and headaches.  Psychiatric/Behavioral: Negative for confusion and decreased concentration.     Physical Exam Triage Vital Signs ED Triage Vitals  Enc Vitals Group     BP 01/10/20 1058 (!) 170/85     Pulse Rate 01/10/20 1058 88     Resp 01/10/20 1058 18     Temp 01/10/20 1058 98.5 F (36.9 C)     Temp Source 01/10/20 1058 Oral     SpO2 01/10/20 1058 98 %     Weight --      Height --      Head Circumference --      Peak Flow --      Pain Score 01/10/20 1054 8     Pain Loc --      Pain Edu? --      Excl. in [place]? --    No data found.  Updated Vital Signs BP (!) 170/85 (BP Location: Left Arm)   Pulse 88   Temp 98.5 F (36.9 C) (Oral)   Resp 18   SpO2 98%   Visual Acuity Right Eye Distance:   Left Eye Distance:   Bilateral Distance:    Right Eye Near:   Left Eye Near:    Bilateral Near:     Physical Exam Constitutional:      General: She is in acute distress.     Appearance: She is not ill-appearing.  Cardiovascular:     Rate and Rhythm: Normal rate and regular rhythm.     Pulses: Normal pulses.     Heart sounds: Normal heart sounds.  Abdominal:     General: Bowel sounds are normal.  Musculoskeletal:        General:  Normal range of motion.  Skin:    General: Skin is warm.     Capillary Refill: Capillary refill takes less than 2 seconds.     Findings: No bruising or rash.  Neurological:  General: No focal deficit present.     Mental Status: She is alert and oriented to person, place, and time.     Cranial Nerves: No cranial nerve deficit.     Sensory: No sensory deficit.     Motor: No weakness.     Coordination: Coordination normal.      UC Treatments / Results  Labs (all labs ordered are listed, but only abnormal results are displayed) Labs Reviewed - No data to display  EKG   Radiology No results found.  Procedures Procedures (including critical care time)  Medications Ordered in UC Medications  ketorolac (TORADOL) 30 MG/ML injection 30 mg (30 mg Intramuscular Given 01/10/20 1202)    Initial Impression / Assessment and Plan / UC Course  I have reviewed the triage vital signs and the nursing notes.  Pertinent labs & imaging results that were available during my care of the patient were reviewed by me and considered in my medical decision making (see chart for details).     1.  Lower back pain with sciatica: Toradol 30 mg IM x1 dose Naproxen 375 mg twice daily Flexeril 10 mg twice daily as needed for muscle spasm Prednisone 20 mg orally daily for 5 days Gentle range of motion exercises If patient notices worsening weakness, persistent numbness she is advised to return to ED for further evaluation and possibly imaging. Final Clinical Impressions(s) / UC Diagnoses   Final diagnoses:  Low back pain due to bilateral sciatica   Discharge Instructions   None    ED Prescriptions    Medication Sig Dispense Auth. Provider   naproxen (NAPROSYN) 375 MG tablet Take 1 tablet (375 mg total) by mouth 2 (two) times daily. 20 tablet Arriah Wadle, Myrene Galas, MD   cyclobenzaprine (FLEXERIL) 10 MG tablet Take 1 tablet (10 mg total) by mouth 3 (three) times daily as needed for muscle spasms.  20 tablet Raksha Wolfgang, Myrene Galas, MD   diclofenac Sodium (VOLTAREN) 1 % GEL Apply 4 g topically 4 (four) times daily. 150 g Chase Picket, MD   predniSONE (DELTASONE) 20 MG tablet Take 1 tablet (20 mg total) by mouth daily for 5 days. 5 tablet Qusay Villada, Myrene Galas, MD     PDMP not reviewed this encounter.   Chase Picket, MD 01/10/20 1343

## 2020-02-16 ENCOUNTER — Other Ambulatory Visit: Payer: Self-pay | Admitting: Rehabilitation

## 2020-02-16 DIAGNOSIS — M5416 Radiculopathy, lumbar region: Secondary | ICD-10-CM

## 2020-02-22 ENCOUNTER — Encounter (HOSPITAL_COMMUNITY): Payer: Self-pay

## 2020-02-22 ENCOUNTER — Ambulatory Visit (HOSPITAL_COMMUNITY)
Admission: EM | Admit: 2020-02-22 | Discharge: 2020-02-22 | Disposition: A | Discharge status: Home / Self Care | Payer: Medicare Other | Attending: Family Medicine | Admitting: Family Medicine

## 2020-02-22 ENCOUNTER — Other Ambulatory Visit: Payer: Self-pay

## 2020-02-22 DIAGNOSIS — I10 Essential (primary) hypertension: Secondary | ICD-10-CM

## 2020-02-22 MED ORDER — AMLODIPINE BESYLATE 5 MG PO TABS: 5.0000 mg | ORAL_TABLET | Freq: Every day | ORAL | 0 refills | Status: DC

## 2020-02-22 NOTE — ED Provider Notes (Signed)
[place]    CSN: [id number] Arrival date & time: 02/22/20  1518      History   Chief Complaint Chief Complaint  Patient presents with  . Hypertension    HPI Deanna Rodgers is a 54 y.o. female history of hypertension, GERD, chronic back pain, presenting today for evaluation of hypertension. Patient notes that this week she has noticed that her blood pressure has been elevated at work. She has had a coworker check it and has noted that she has had readings of 123456 diastolic, she is unsure what the top number was, but states that it was high. Overall she has felt relatively normal, denies any headaches, vision changes, dizziness or lightheadedness. Denies any weakness. Denies chest pain or shortness of breath. She previously was on amlodipine for elevated blood pressure, but has not been taking this of recently. She has plans to reestablish care with a PCP on 5/25. She does report occasional blurring, but attributes this to a new prescription glasses. Has been working on exercise, healthy eating.  HPI  Past Medical History:  Diagnosis Date  . Abnormal Pap smear    bx  . Anemia   . 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 Depression   . History of duodenal ulcer   . History of gastroesophageal reflux (GERD)   . Hypertension   . Pain    LOWER BACK     Patient Active Problem List   Diagnosis Date Noted  . 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 Painful total knee replacement ([place]) 04/14/2013  . Synovitis of knee 12/23/2012  . Hypertension 10/16/2012  . Overdose 10/15/2012  . Degenerative arthritis of knee 12/25/2011  . HEMORRHOIDS-INTERNAL 10/14/2010  . HEMORRHOIDS-EXTERNAL 10/14/2010  . GERD 10/14/2010  . ULCER-DUODENAL 10/14/2010  . Irritable bowel syndrome 10/14/2010  . ACUT DUODEN ULCER W/PERF W/O MENTION OBSTRUCTION 10/28/2009  . IRON DEFICIENCY  08/08/2009  . DYSPEPSIA&OTHER [place] DISORDERS FUNCTION STOMACH 08/08/2009  . RECTAL BLEEDING 08/08/2009    Past Surgical History:  Procedure Laterality Date  . CESAREAN SECTION  12-24-11   '85- x1  . hemorroidectomy  yrs ago  . JOINT REPLACEMENT  12/2011   rt total knee  . KNEE ARTHROPLASTY  12/25/2011   Procedure: COMPUTER ASSISTED TOTAL KNEE ARTHROPLASTY;  Surgeon: Mcarthur Rossetti, MD;  [facility];  Service: Orthopedics;  Laterality: Right;  . KNEE ARTHROSCOPY  12-24-11   x2 -[facility] 12[facility]  . KNEE ARTHROSCOPY Right 12/23/2012   Procedure: Right Knee Arthroscopy with minimal Debridement ;  Surgeon: Mcarthur Rossetti, MD;  Location[facility];  Service: Orthopedics;  Laterality: Right;  Right Knee Arthroscopy with minimal  Debridement   . TOTAL KNEE REVISION Right 04/14/2013   Procedure: Synovectomy right total knee with poly exchange;  Surgeon: Mcarthur Rossetti, MD;  [facility];  Service: Orthopedics;  Laterality: Right;  . TOTAL KNEE REVISION Right 08/11/2013   Procedure: REVISION RIGHT TOTAL KNEE ARTHROPLASTY;  Surgeon: Mcarthur Rossetti, MD;  [facility];  Service: Orthopedics;  Laterality: Right;  . TUBAL LIGATION      OB History    Gravida  5   Para  5   Term  3   Preterm  2   AB  0   Living  5     SAB  0   TAB  0  Ectopic  0   Multiple  0   Live Births               Home Medications    Prior to Admission medications   Medication Sig Start Date End Date Taking? Authorizing Provider  amLODipine (NORVASC) 5 MG tablet Take 1 tablet (5 mg total) by mouth daily. 02/22/20   Wieters, Hallie C, PA-C  cyclobenzaprine (FLEXERIL) 10 MG tablet Take 1 tablet (10 mg total) by mouth 3 (three) times daily as needed for muscle spasms. 01/10/20   Lamptey, Myrene Galas, MD  diclofenac Sodium (VOLTAREN) 1 % GEL Apply 4 g topically 4 (four) times daily. 01/10/20   Chase Picket, MD  naproxen (NAPROSYN) 375 MG tablet Take 1  tablet (375 mg total) by mouth 2 (two) times daily. 01/10/20   LampteyMyrene Galas, MD    Family History Family History  Problem Relation Age of Onset  . Hypertension Maternal Grandmother   . Stroke Maternal Grandmother   . Hypertension Father   . Stroke Father   . Hypertension Maternal Aunt   . Cancer Maternal Uncle   . Cancer Paternal Uncle     Social History Social History   Tobacco Use  . Smoking status: Never Smoker  . Smokeless tobacco: Never Used  Substance Use Topics  . Alcohol use: No  . Drug use: No     Allergies   Patient has no known allergies.   Review of Systems Review of Systems  Constitutional: Negative for fatigue and fever.  HENT: Negative for congestion, sinus pressure and sore throat.   Eyes: Negative for photophobia, pain and visual disturbance.  Respiratory: Negative for cough and shortness of breath.   Cardiovascular: Negative for chest pain.  Gastrointestinal: Negative for abdominal pain, nausea and vomiting.  Genitourinary: Negative for decreased urine volume and hematuria.  Musculoskeletal: Negative for myalgias, neck pain and neck stiffness.  Neurological: Negative for dizziness, syncope, facial asymmetry, speech difficulty, weakness, light-headedness, numbness and headaches.     Physical Exam Triage Vital Signs ED Triage Vitals  Enc Vitals Group     BP 02/22/20 1610 140/87     Pulse Rate 02/22/20 1610 97     Resp 02/22/20 1610 18     Temp 02/22/20 1610 98.4 F (36.9 C)     Temp Source 02/22/20 1610 Oral     SpO2 02/22/20 1610 100 %     Weight --      Height --      Head Circumference --      Peak Flow --      Pain Score 02/22/20 1612 0     Pain Loc --      Pain Edu? --      Excl. in [place]? --    No data found.  Updated Vital Signs BP 140/87 (BP Location: Right Arm)   Pulse 97   Temp 98.4 F (36.9 C) (Oral)   Resp 18   LMP 01/25/2020   SpO2 100%   Visual Acuity Right Eye Distance:   Left Eye Distance:   Bilateral  Distance:    Right Eye Near:   Left Eye Near:    Bilateral Near:     Physical Exam Vitals and nursing note reviewed.  Constitutional:      Appearance: She is well-developed.     Comments: No acute distress  HENT:     Head: Normocephalic and atraumatic.     Ears:     Comments: Bilateral ears  without tenderness to palpation of external auricle, tragus and mastoid, EAC's without erythema or swelling, TM's with good bony landmarks and cone of light. Non erythematous.     Nose: Nose normal.     Mouth/Throat:     Comments: Oral mucosa pink and moist, no tonsillar enlargement or exudate. Posterior pharynx patent and nonerythematous, no uvula deviation or swelling. Normal phonation. Eyes:     Extraocular Movements: Extraocular movements intact.     Conjunctiva/sclera: Conjunctivae normal.     Pupils: Pupils are equal, round, and reactive to light.  Cardiovascular:     Rate and Rhythm: Normal rate.     Comments: No carotid bruits auscultated Pulmonary:     Effort: Pulmonary effort is normal. No respiratory distress.     Comments: Breathing comfortably at rest, CTABL, no wheezing, rales or other adventitious sounds auscultated Abdominal:     General: There is no distension.  Musculoskeletal:        General: Normal range of motion.     Cervical back: Neck supple.  Skin:    General: Skin is warm and dry.  Neurological:     General: No focal deficit present.     Mental Status: She is alert and oriented to person, place, and time.     Comments: Patient A&O x3, cranial nerves II-XII grossly intact, strength at shoulders, hips and knees 5/5, equal bilaterally, patellar reflex 2+ bilaterally. . Negative Romberg . Gait without abnormality.       UC Treatments / Results  Labs (all labs ordered are listed, but only abnormal results are displayed) Labs Reviewed - No data to display  EKG   Radiology No results found.  Procedures Procedures (including critical care time)   Medications Ordered in UC Medications - No data to display  Initial Impression / Assessment and Plan / UC Course  I have reviewed the triage vital signs and the nursing notes.  Pertinent labs & imaging results that were available during my care of the patient were reviewed by me and considered in my medical decision making (see chart for details).     Blood pressure reassuring at 140/87, last visit 1 month ago with systolic readings in [id number]. Has history of hypertension. Will restart patient on amlodipine and have follow-up with PCP as planned in May. Currently asymptomatic, discussed warning signs to follow-up in emergency room for.  Discussed strict return precautions. Patient verbalized understanding and is agreeable with plan.  Final Clinical Impressions(s) / UC Diagnoses   Final diagnoses:  Essential hypertension     Discharge Instructions     I am restarting you on amlodipine 5 mg daily. Monitor blood pressure at work. Follow up with primary as planned  Please go to Emergency Room if you start to experience severe headache, vision changes, decreased urine production, chest pain, shortness of breath, speech slurring, one sided weakness.   ED Prescriptions    Medication Sig Dispense Auth. Provider   amLODipine (NORVASC) 5 MG tablet Take 1 tablet (5 mg total) by mouth daily. 90 tablet Wieters, [place] C, PA-C     PDMP not reviewed this encounter.   Janith Lima, [place] 02/22/20 1644

## 2020-02-22 NOTE — ED Triage Notes (Signed)
Pt presents with elevated blood pressure the past few days.

## 2020-02-22 NOTE — Discharge Instructions (Signed)
I am restarting you on amlodipine 5 mg daily. Monitor blood pressure at work. Follow up with primary as planned  Please go to Emergency Room if you start to experience severe headache, vision changes, decreased urine production, chest pain, shortness of breath, speech slurring, one sided weakness.

## 2020-03-02 ENCOUNTER — Other Ambulatory Visit: Payer: Self-pay

## 2020-03-02 ENCOUNTER — Ambulatory Visit
Admission: RE | Admit: 2020-03-02 | Discharge: 2020-03-02 | Disposition: A | Discharge status: Home / Self Care | Payer: Medicare Other | Source: Ambulatory Visit | Attending: Rehabilitation | Admitting: Rehabilitation

## 2020-03-02 DIAGNOSIS — M5416 Radiculopathy, lumbar region: Secondary | ICD-10-CM

## 2020-03-26 ENCOUNTER — Other Ambulatory Visit: Payer: Self-pay

## 2020-03-27 ENCOUNTER — Encounter: Payer: Self-pay | Admitting: Internal Medicine

## 2020-03-27 ENCOUNTER — Encounter: Payer: Self-pay | Admitting: Gastroenterology

## 2020-03-27 ENCOUNTER — Ambulatory Visit (INDEPENDENT_AMBULATORY_CARE_PROVIDER_SITE_OTHER): Payer: 59 | Admitting: Internal Medicine

## 2020-03-27 VITALS — BP 110/80 | HR 97 | Temp 97.6°F | Ht 61.0 in | Wt 190.2 lb

## 2020-03-27 DIAGNOSIS — Z124 Encounter for screening for malignant neoplasm of cervix: Secondary | ICD-10-CM

## 2020-03-27 DIAGNOSIS — R600 Localized edema: Secondary | ICD-10-CM

## 2020-03-27 DIAGNOSIS — Z1239 Encounter for other screening for malignant neoplasm of breast: Secondary | ICD-10-CM | POA: Diagnosis not present

## 2020-03-27 DIAGNOSIS — I10 Essential (primary) hypertension: Secondary | ICD-10-CM

## 2020-03-27 DIAGNOSIS — Z1211 Encounter for screening for malignant neoplasm of colon: Secondary | ICD-10-CM

## 2020-03-27 DIAGNOSIS — K219 Gastro-esophageal reflux disease without esophagitis: Secondary | ICD-10-CM

## 2020-03-27 MED ORDER — HYDROCHLOROTHIAZIDE 25 MG PO TABS: 25.0000 mg | ORAL_TABLET | Freq: Every day | ORAL | 1 refills | Status: DC

## 2020-03-27 NOTE — Patient Instructions (Signed)
-Nice seeing you today!!  -Stop taking amlodipine.  -Start HCTZ 25 mg daily.  -Schedule a 6 week follow up for blood pressure management.  -Low salt diet (see below).   DASH Eating Plan DASH stands for "Dietary Approaches to Stop Hypertension." The DASH eating plan is a healthy eating plan that has been shown to reduce high blood pressure (hypertension). It may also reduce your risk for type 2 diabetes, heart disease, and stroke. The DASH eating plan may also help with weight loss. What are tips for following this plan?  General guidelines  Avoid eating more than 2,300 mg (milligrams) of salt (sodium) a day. If you have hypertension, you may need to reduce your sodium intake to 1,500 mg a day.  Limit alcohol intake to no more than 1 drink a day for nonpregnant women and 2 drinks a day for men. One drink equals 12 oz of beer, 5 oz of wine, or 1 oz of hard liquor.  Work with your health care provider to maintain a healthy body weight or to lose weight. Ask what an ideal weight is for you.  Get at least 30 minutes of exercise that causes your heart to beat faster (aerobic exercise) most days of the week. Activities may include walking, swimming, or biking.  Work with your health care provider or diet and nutrition specialist (dietitian) to adjust your eating plan to your individual calorie needs. Reading food labels   Check food labels for the amount of sodium per serving. Choose foods with less than 5 percent of the Daily Value of sodium. Generally, foods with less than 300 mg of sodium per serving fit into this eating plan.  To find whole grains, look for the word "whole" as the first word in the ingredient list. Shopping  Buy products labeled as "low-sodium" or "no salt added."  Buy fresh foods. Avoid canned foods and premade or frozen meals. Cooking  Avoid adding salt when cooking. Use salt-free seasonings or herbs instead of table salt or sea salt. Check with your health  care provider or pharmacist before using salt substitutes.  Do not fry foods. Cook foods using healthy methods such as baking, boiling, grilling, and broiling instead.  Cook with heart-healthy oils, such as olive, canola, soybean, or sunflower oil. Meal planning  Eat a balanced diet that includes: ? 5 or more servings of fruits and vegetables each day. At each meal, try to fill half of your plate with fruits and vegetables. ? Up to 6-8 servings of whole grains each day. ? Less than 6 oz of lean meat, poultry, or fish each day. A 3-oz serving of meat is about the same size as a deck of cards. One egg equals 1 oz. ? 2 servings of low-fat dairy each day. ? A serving of nuts, seeds, or beans 5 times each week. ? Heart-healthy fats. Healthy fats called Omega-3 fatty acids are found in foods such as flaxseeds and coldwater fish, like sardines, salmon, and mackerel.  Limit how much you eat of the following: ? Canned or prepackaged foods. ? Food that is high in trans fat, such as fried foods. ? Food that is high in saturated fat, such as fatty meat. ? Sweets, desserts, sugary drinks, and other foods with added sugar. ? Full-fat dairy products.  Do not salt foods before eating.  Try to eat at least 2 vegetarian meals each week.  Eat more home-cooked food and less restaurant, buffet, and fast food.  When eating at a  restaurant, ask that your food be prepared with less salt or no salt, if possible. What foods are recommended? The items listed may not be a complete list. Talk with your dietitian about what dietary choices are best for you. Grains Whole-grain or whole-wheat bread. Whole-grain or whole-wheat pasta. Brown rice. [place]. Bulgur. Whole-grain and low-sodium cereals. Pita bread. Low-fat, low-sodium crackers. Whole-wheat flour tortillas. Vegetables Fresh or frozen vegetables (raw, steamed, roasted, or grilled). Low-sodium or reduced-sodium tomato and vegetable juice. Low-sodium  or reduced-sodium tomato sauce and tomato paste. Low-sodium or reduced-sodium canned vegetables. Fruits All fresh, dried, or frozen fruit. Canned fruit in natural juice (without added sugar). Meat and other protein foods Skinless chicken or [place]. Ground chicken or [place]. Pork with fat trimmed off. Fish and seafood. Egg whites. Dried beans, peas, or lentils. Unsalted nuts, nut butters, and seeds. Unsalted canned beans. Lean cuts of beef with fat trimmed off. Low-sodium, lean deli meat. Dairy Low-fat (1%) or fat-free (skim) milk. Fat-free, low-fat, or reduced-fat cheeses. Nonfat, low-sodium ricotta or cottage cheese. Low-fat or nonfat yogurt. Low-fat, low-sodium cheese. Fats and oils Soft margarine without trans fats. Vegetable oil. Low-fat, reduced-fat, or light mayonnaise and salad dressings (reduced-sodium). Canola, safflower, olive, soybean, and sunflower oils. Avocado. Seasoning and other foods Herbs. Spices. Seasoning mixes without salt. Unsalted popcorn and pretzels. Fat-free sweets. What foods are not recommended? The items listed may not be a complete list. Talk with your dietitian about what dietary choices are best for you. Grains Baked goods made with fat, such as croissants, muffins, or some breads. Dry pasta or rice meal packs. Vegetables Creamed or fried vegetables. Vegetables in a cheese sauce. Regular canned vegetables (not low-sodium or reduced-sodium). Regular canned tomato sauce and paste (not low-sodium or reduced-sodium). Regular tomato and vegetable juice (not low-sodium or reduced-sodium). Angie Fava. Olives. Fruits Canned fruit in a light or heavy syrup. Fried fruit. Fruit in cream or butter sauce. Meat and other protein foods Fatty cuts of meat. Ribs. Fried meat. Berniece Salines. Sausage. Bologna and other processed lunch meats. Salami. Fatback. Hotdogs. Bratwurst. Salted nuts and seeds. Canned beans with added salt. Canned or smoked fish. Whole eggs or egg yolks. Chicken or [place]  with skin. Dairy Whole or 2% milk, cream, and half-and-half. Whole or full-fat cream cheese. Whole-fat or sweetened yogurt. Full-fat cheese. Nondairy creamers. Whipped toppings. Processed cheese and cheese spreads. Fats and oils Butter. Stick margarine. Lard. Shortening. Ghee. Bacon fat. Tropical oils, such as coconut, palm kernel, or palm oil. Seasoning and other foods Salted popcorn and pretzels. Onion salt, garlic salt, seasoned salt, table salt, and sea salt. Worcestershire sauce. Tartar sauce. Barbecue sauce. Teriyaki sauce. Soy sauce, including reduced-sodium. Steak sauce. Canned and packaged gravies. Fish sauce. Oyster sauce. Cocktail sauce. Horseradish that you find on the shelf. Ketchup. Mustard. Meat flavorings and tenderizers. Bouillon cubes. Hot sauce and Tabasco sauce. Premade or packaged marinades. Premade or packaged taco seasonings. Relishes. Regular salad dressings. Where to find more information:  National Heart, Lung, and [place]: https://wilson-eaton.com/  American Heart Association: www.heart.org Summary  The DASH eating plan is a healthy eating plan that has been shown to reduce high blood pressure (hypertension). It may also reduce your risk for type 2 diabetes, heart disease, and stroke.  With the DASH eating plan, you should limit salt (sodium) intake to 2,300 mg a day. If you have hypertension, you may need to reduce your sodium intake to 1,500 mg a day.  When on the DASH eating plan, aim to eat more  fresh fruits and vegetables, whole grains, lean proteins, low-fat dairy, and heart-healthy fats.  Work with your health care provider or diet and nutrition specialist (dietitian) to adjust your eating plan to your individual calorie needs. This information is not intended to replace advice given to you by your health care provider. Make sure you discuss any questions you have with your health care provider. Document Revised: 10/01/2017 Document Reviewed:  10/12/2016 Elsevier Patient Education  2020 [employer].

## 2020-03-27 NOTE — Progress Notes (Signed)
New Patient Office Visit     This visit occurred during the SARS-CoV-2 public health emergency.  Safety protocols were in place, including screening questions prior to the visit, additional usage of staff PPE, and extensive cleaning of exam room while observing appropriate contact time as indicated for disinfecting solutions.    CC/Reason for Visit: Establish care, discuss chronic conditions, discuss lower extremity swelling Previous PCP: Physician in [facility] Last Visit: Early 2020  HPI: Deanna Rodgers is a 54 y.o. female who is coming in today for the above mentioned reasons. Past Medical History is significant for: Hypertension that has been well controlled on amlodipine 5 mg and GERD well-controlled on daily PPI therapy.  She is here to establish care as she is trying to move to [place].  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  She has been complaining of some right greater than left lower extremity edema now for 3 weeks.  Denies chest pain or shortness of breath.  No pain to her legs other than pain to her right knee that is chronic.  She also states that "her stomach is full of air".  She tells me that she had a colonoscopy 2 years ago with a large polyp and was asked to follow-up in 1 year which she has not.  She is also due for breast and cervical cancer screening.   Past Medical/Surgical History: Past Medical History:  Diagnosis Date  . Abnormal Pap smear    bx  . Anemia   . 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 Depression   . History of  duodenal ulcer   . History of gastroesophageal reflux (GERD)   . Hypertension   . Pain    LOWER BACK     Past Surgical History:  Procedure Laterality Date  . CESAREAN SECTION  12-24-11   '85- x1  . hemorroidectomy  yrs ago  . JOINT REPLACEMENT  12/2011   rt total knee  . KNEE ARTHROPLASTY  12/25/2011   Procedure: COMPUTER ASSISTED TOTAL KNEE ARTHROPLASTY;  Surgeon: Mcarthur Rossetti, MD;  Location: [facility];  Service: Orthopedics;  Laterality: Right;  . KNEE ARTHROSCOPY  12-24-11   x2 -[facility] 12'[facility]  . KNEE ARTHROSCOPY Right 12/23/2012   Procedure: Right Knee Arthroscopy with minimal Debridement ;  Surgeon: Mcarthur Rossetti, MD;  Location: [facility];  Service: Orthopedics;  Laterality: Right;  Right Knee Arthroscopy with minimal  Debridement   . TOTAL KNEE REVISION Right 04/14/2013   Procedure: Synovectomy right total knee with poly exchange;  Surgeon: Mcarthur Rossetti, MD;  Location: [facility];  Service: Orthopedics;  Laterality: Right;  . TOTAL KNEE REVISION Right 08/11/2013   Procedure: REVISION RIGHT TOTAL KNEE ARTHROPLASTY;  Surgeon: Mcarthur Rossetti, MD;  Location: [facility];  Service: Orthopedics;  Laterality: Right;  . TUBAL LIGATION      Social History:  reports that she has never smoked. She has never used smokeless tobacco. She reports that she does not drink alcohol or use drugs.  Allergies: No Known  Allergies  Family History:  Family History  Problem Relation Age of Onset  . Hypertension Maternal Grandmother   . Stroke Maternal Grandmother   . Hypertension Father   . Stroke Father   . Hypertension Maternal Aunt   . Cancer Maternal Uncle   . Cancer Paternal Uncle      Current Outpatient Medications:  .  amLODipine (NORVASC) 5 MG tablet, Take 1 tablet (5 mg total) by mouth daily., Disp: 90 tablet, Rfl: 0 .  diclofenac Sodium (VOLTAREN) 1 % GEL, Apply topically 4 (four) times daily., Disp: , Rfl:  .  linaclotide (LINZESS) 72 MCG capsule,  Take 72 mcg by mouth daily before breakfast., Disp: , Rfl:  .  omeprazole (PRILOSEC) 40 MG capsule, Take 40 mg by mouth daily., Disp: , Rfl:  .  pregabalin (LYRICA) 50 MG capsule, Take 50 mg by mouth 3 (three) times daily., Disp: , Rfl:  .  hydrochlorothiazide (HYDRODIURIL) 25 MG tablet, Take 1 tablet (25 mg total) by mouth daily., Disp: 90 tablet, Rfl: 1  Review of Systems:  Constitutional: Denies fever, chills, diaphoresis, appetite change and fatigue.  HEENT: Denies photophobia, eye pain, redness, hearing loss, ear pain, congestion, sore throat, rhinorrhea, sneezing, mouth sores, trouble swallowing, neck pain, neck stiffness and tinnitus.   Respiratory: Denies SOB, DOE, cough, chest tightness,  and wheezing.   Cardiovascular: Denies chest pain, palpitations. Gastrointestinal: Denies nausea, vomiting, abdominal pain, diarrhea, constipation, blood in stool. Genitourinary: Denies dysuria, urgency, frequency, hematuria, flank pain and difficulty urinating.  Endocrine: Denies: hot or cold intolerance, sweats, changes in hair or nails, polyuria, polydipsia. Musculoskeletal: Denies myalgias, back pain, joint swelling, arthralgias and gait problem.  Skin: Denies pallor, rash and wound.  Neurological: Denies dizziness, seizures, syncope, weakness, light-headedness, numbness and headaches.  Hematological: Denies adenopathy. Easy bruising, personal or family bleeding history  Psychiatric/Behavioral: Denies suicidal ideation, mood changes, confusion, nervousness, sleep disturbance and agitation    Physical Exam: Vitals:   03/27/20 1058  BP: 110/80  Pulse: 97  Temp: 97.6 F (36.4 C)  TempSrc: Temporal  SpO2: 98%  Weight: 190 lb 3.2 oz (86.3 kg)  Height: 5\' 1"  (1.549 m)   Body mass index is 35.94 kg/m.   Constitutional: NAD, calm, comfortable Eyes: PERRL, lids and conjunctivae normal ENMT: Mucous membranes are moist.  Respiratory: clear to auscultation bilaterally, no wheezing, no  crackles. Normal respiratory effort. No accessory muscle use.  Cardiovascular: Regular rate and rhythm, no murmurs / rubs / gallops.  2+ lower extremity edema from mid shins down more prominent on the right. Abdomen: no tenderness, no masses palpated.  Positive distention, no fluid wave bowel sounds positive.  Neurologic: Grossly intact and nonfocal.  Psychiatric: Normal judgment and insight. Alert and oriented x 3. Normal mood.    Impression and Plan:  Screening for malignant neoplasm of colon  - Plan: Ambulatory referral to Gastroenterology -I am somewhat concerned about her complaint of abdominal distention and the findings of the colonoscopy 2 years ago with lack of appropriate follow-up.  Screening for cervical cancer  - Plan: Ambulatory referral to Obstetrics / Gynecology  Screening breast examination  - Plan: MM Digital Screening  Essential hypertension  Bilateral lower extremity edema -Blood pressures well controlled today at 110/80, however I suspect that her bilateral lower extremity edema may be a side effect from amlodipine. -Will discontinue amlodipine and placed on hydrochlorothiazide 25 mg daily. -She will return in 6 weeks for follow-up.  Gastroesophageal reflux disease without esophagitis -Well-controlled on daily PPI therapy  Patient Instructions  -Nice seeing you today!!  -Stop taking amlodipine.  -Start HCTZ 25 mg daily.  -Schedule a 6 week follow up for blood pressure management.  -Low salt diet (see below).   DASH Eating Plan DASH stands for "Dietary Approaches to Stop Hypertension." The DASH eating plan is a healthy eating plan that has been shown to reduce high blood pressure (hypertension). It may also reduce your risk for type 2 diabetes, heart disease, and stroke. The DASH eating plan may also help with weight loss. What are tips for following this plan?  General guidelines  Avoid eating more than 2,300 mg (milligrams) of salt (sodium) a  day. If you have hypertension, you may need to reduce your sodium intake to 1,500 mg a day.  Limit alcohol intake to no more than 1 drink a day for nonpregnant women and 2 drinks a day for men. One drink equals 12 oz of beer, 5 oz of wine, or 1 oz of hard liquor.  Work with your health care provider to maintain a healthy body weight or to lose weight. Ask what an ideal weight is for you.  Get at least 30 minutes of exercise that causes your heart to beat faster (aerobic exercise) most days of the week. Activities may include walking, swimming, or biking.  Work with your health care provider or diet and nutrition specialist (dietitian) to adjust your eating plan to your individual calorie needs. Reading food labels   Check food labels for the amount of sodium per serving. Choose foods with less than 5 percent of the Daily Value of sodium. Generally, foods with less than 300 mg of sodium per serving fit into this eating plan.  To find whole grains, look for the word "whole" as the first word in the ingredient list. Shopping  Buy products labeled as "low-sodium" or "no salt added."  Buy fresh foods. Avoid canned foods and premade or frozen meals. Cooking  Avoid adding salt when cooking. Use salt-free seasonings or herbs instead of table salt or sea salt. Check with your health care provider or pharmacist before using salt substitutes.  Do not fry foods. Cook foods using healthy methods such as baking, boiling, grilling, and broiling instead.  Cook with heart-healthy oils, such as olive, canola, soybean, or sunflower oil. Meal planning  Eat a balanced diet that includes: ? 5 or more servings of fruits and vegetables each day. At each meal, try to fill half of your plate with fruits and vegetables. ? Up to 6-8 servings of whole grains each day. ? Less than 6 oz of lean meat, poultry, or fish each day. A 3-oz serving of meat is about the same size as a deck of cards. One egg equals 1  oz. ? 2 servings of low-fat dairy each day. ? A serving of nuts, seeds, or beans 5 times each week. ? Heart-healthy fats. Healthy fats called Omega-3 fatty acids are found in foods such as flaxseeds and coldwater fish, like sardines, salmon, and mackerel.  Limit how much you eat of the following: ? Canned or prepackaged foods. ? Food that is high in trans fat, such as fried foods. ? Food that is high in saturated fat, such as fatty meat. ? Sweets, desserts, sugary drinks, and other foods with added sugar. ? Full-fat dairy products.  Do not salt foods before eating.  Try to eat at least 2 vegetarian meals each week.  Eat more home-cooked food and less restaurant, buffet, and fast food.  When eating at a restaurant, ask that your food be prepared with less salt or no salt, if possible. What foods are recommended? The items listed may not be a complete list. Talk with your dietitian about what dietary choices are best for you. Grains Whole-grain or whole-wheat bread. Whole-grain or whole-wheat pasta. Brown rice. [place]. Bulgur. Whole-grain and low-sodium cereals. Pita bread. Low-fat, low-sodium crackers. Whole-wheat flour tortillas. Vegetables Fresh or frozen vegetables (raw, steamed, roasted, or grilled). Low-sodium or reduced-sodium tomato and vegetable juice. Low-sodium or reduced-sodium tomato sauce and tomato paste. Low-sodium or reduced-sodium canned vegetables. Fruits All fresh, dried, or frozen fruit. Canned fruit in natural juice (without added sugar). Meat and other protein foods Skinless chicken or [place]. Ground chicken or [place]. Pork with fat trimmed off. Fish and seafood. Egg whites. Dried beans, peas, or lentils. Unsalted nuts, nut butters, and seeds. Unsalted canned beans. Lean cuts of beef with fat trimmed off. Low-sodium, lean deli meat. Dairy Low-fat (1%) or fat-free (skim) milk. Fat-free, low-fat, or reduced-fat cheeses. Nonfat, low-sodium ricotta or cottage  cheese. Low-fat or nonfat yogurt. Low-fat, low-sodium cheese. Fats and oils Soft margarine without trans fats. Vegetable oil. Low-fat, reduced-fat, or light mayonnaise and salad dressings (reduced-sodium). Canola, safflower, olive, soybean, and sunflower oils. Avocado. Seasoning and other foods Herbs. Spices. Seasoning mixes without salt. Unsalted popcorn and pretzels. Fat-free sweets. What foods are not recommended? The items listed may not be a complete list. Talk with your dietitian about what dietary choices are best for you. Grains Baked goods made with fat, such as croissants, muffins, or some breads. Dry pasta or rice meal packs. Vegetables Creamed or fried vegetables. Vegetables in a cheese sauce. Regular canned vegetables (not low-sodium or reduced-sodium). Regular canned tomato sauce and paste (not low-sodium or reduced-sodium). Regular tomato and vegetable juice (not low-sodium or reduced-sodium). Angie Fava. Olives. Fruits Canned fruit in a light or heavy syrup. Fried fruit. Fruit in cream or butter sauce. Meat and other protein foods Fatty cuts of meat. Ribs. Fried meat. Berniece Salines. Sausage. Bologna and other processed lunch meats. Salami. Fatback. Hotdogs. Bratwurst. Salted nuts and seeds. Canned beans with added salt. Canned or smoked fish. Whole eggs or egg yolks. Chicken or [place] with skin. Dairy Whole or 2% milk, cream, and half-and-half. Whole or full-fat cream cheese. Whole-fat or sweetened yogurt. Full-fat cheese. Nondairy creamers. Whipped toppings. Processed cheese and cheese spreads. Fats and oils Butter. Stick margarine. Lard. Shortening. Ghee. Bacon fat. Tropical oils, such as coconut, palm kernel, or palm oil. Seasoning and other foods Salted popcorn and pretzels. Onion salt, garlic salt, seasoned salt, table salt, and sea salt. Worcestershire sauce. Tartar sauce. Barbecue sauce. Teriyaki sauce. Soy sauce, including reduced-sodium. Steak sauce. Canned and packaged gravies.  Fish sauce. Oyster sauce. Cocktail sauce. Horseradish that you find on the shelf. Ketchup. Mustard. Meat flavorings and tenderizers. Bouillon cubes. Hot sauce and Tabasco sauce. Premade or packaged marinades. Premade or packaged taco seasonings. Relishes. Regular salad dressings. Where to find more information:  National Heart, Lung, and [place]: https://wilson-eaton.com/  American Heart Association: www.heart.org Summary  The DASH eating plan is a healthy eating plan that has been shown to reduce high blood pressure (hypertension). It may also reduce your risk for type 2 diabetes, heart disease, and stroke.  With the DASH eating plan, you should limit salt (sodium) intake to 2,300 mg a day. If you have hypertension, you may need to reduce your sodium intake to 1,500 mg a day.  When on the DASH eating plan,  aim to eat more fresh fruits and vegetables, whole grains, lean proteins, low-fat dairy, and heart-healthy fats.  Work with your health care provider or diet and nutrition specialist (dietitian) to adjust your eating plan to your individual calorie needs. This information is not intended to replace advice given to you by your health care provider. Make sure you discuss any questions you have with your health care provider. Document Revised: 10/01/2017 Document Reviewed: 10/12/2016 Elsevier Patient Education  2020 [place], MD [facility]

## 2020-04-05 ENCOUNTER — Encounter: Payer: Self-pay | Admitting: Obstetrics and Gynecology

## 2020-04-05 ENCOUNTER — Ambulatory Visit (INDEPENDENT_AMBULATORY_CARE_PROVIDER_SITE_OTHER): Payer: 59 | Admitting: Obstetrics and Gynecology

## 2020-04-05 ENCOUNTER — Other Ambulatory Visit: Payer: Self-pay

## 2020-04-05 VITALS — BP 122/80 | Ht 65.0 in | Wt 187.0 lb

## 2020-04-05 DIAGNOSIS — Z01419 Encounter for gynecological examination (general) (routine) without abnormal findings: Secondary | ICD-10-CM

## 2020-04-05 DIAGNOSIS — Z1329 Encounter for screening for other suspected endocrine disorder: Secondary | ICD-10-CM

## 2020-04-05 DIAGNOSIS — Z124 Encounter for screening for malignant neoplasm of cervix: Secondary | ICD-10-CM

## 2020-04-05 DIAGNOSIS — Z13220 Encounter for screening for lipoid disorders: Secondary | ICD-10-CM

## 2020-04-05 DIAGNOSIS — R102 Pelvic and perineal pain: Secondary | ICD-10-CM

## 2020-04-05 NOTE — Progress Notes (Signed)
Deanna Rodgers January 23, 1966 [id number]  SUBJECTIVE:  54 y.o. [id number] female new patient for annual routine gynecologic exam and Pap smear. She has no gynecologic concerns. Occasional lower midline abdominal pains every few weeks.  Dull pain, no pressure, only notes if pushing on the area or if she empties her bladder.  She notices it when underpants press on the area.  No radiation of pain to flank or back.  No painful urination.  She gets up to bathroom 4-5 times per night to void.  She does take HCTZ for HTN.  She is very constipated describing only 1 bowel movement per week on average. She used to have heavy periods but is now only having light spotting on her period after having an endometrial ablation about 1 year ago in [place].  The abdominal pain is not related to timing of her period.   Current Outpatient Medications  Medication Sig Dispense Refill  . amLODipine (NORVASC) 5 MG tablet Take 1 tablet (5 mg total) by mouth daily. 90 tablet 0  . diclofenac Sodium (VOLTAREN) 1 % GEL Apply topically 4 (four) times daily.    . hydrochlorothiazide (HYDRODIURIL) 25 MG tablet Take 1 tablet (25 mg total) by mouth daily. 90 tablet 1  . linaclotide (LINZESS) 72 MCG capsule Take 72 mcg by mouth daily before breakfast.    . omeprazole (PRILOSEC) 40 MG capsule Take 40 mg by mouth daily.    . pregabalin (LYRICA) 50 MG capsule Take 50 mg by mouth 3 (three) times daily.     No current facility-administered medications for this visit.   Allergies: Patient has no known allergies.  Patient's last menstrual period was 03/22/2020.  Past medical history,surgical history, problem list, medications, allergies, family history and social history were all reviewed and documented as reviewed in the EPIC chart.  ROS:  Feeling well. No dyspnea or chest pain on exertion.  +abdominal pain, +constipation, no black or bloody stools.  No urinary tract symptoms. GYN ROS: no abnormal bleeding, pelvic pain or discharge,  no breast pain or new or enlarging lumps on self exam. No neurological complaints.   OBJECTIVE:  BP 122/80   Ht 5\' 5"  (1.651 m)   Wt 187 lb (84.8 kg)   LMP 03/22/2020   BMI 31.12 kg/m  The patient appears well, alert, oriented x 3, in no distress. ENT normal.  Neck supple. No cervical or supraclavicular adenopathy or thyromegaly.  Lungs are clear, good air entry, no wheezes, rhonchi or rales. S1 and S2 normal, no murmurs, regular rate and rhythm.  Abdomen soft without tenderness, guarding, mass or organomegaly.  Neurological is normal, no focal findings.  BREAST EXAM: breasts appear normal, no suspicious masses, no skin or nipple changes or axillary nodes  PELVIC EXAM: VULVA: normal appearing vulva with no masses, tenderness or lesions, VAGINA: normal appearing vagina with normal color and discharge, no lesions, CERVIX: normal appearing cervix without discharge or lesions, UTERUS: uterus is normal size, shape, consistency and nontender, ADNEXA: normal adnexa in size, nontender and no masses, PAP: Pap smear done today, thin-prep method  Chaperone: Caryn Bee present during the examination  ASSESSMENT:  54 y.o. [id number] here for annual gynecologic exam  PLAN:   1. Perimenopausal.  No hormonal concerns.  Denies hot flashes or night sweats.  No abnormal bleeding.  Previous endometrial ablation about 1 year ago at another facility and just having very light periods now.  Previous tubal ligation for contraception. 2. Pap smear 2012.  She reports a history  of abnormal Pap smears and cryotherapy in about 2006.  Pap smear is collected today. 3. Mammogram 2011.  Normal breast exam today.  Next mammogram is scheduled 04/18/2020. 4. Colonoscopy 2019.  Recommended that she follow up at the recommended interval.  She states she has a follow-up colonoscopy this year.   5.  Lower abdominal pain.  We will check a urinalysis.  Unlikely to be related to gynecologic organs with normal pelvic exam and  noncyclic nature of the discomfort without laterality.  She describes significant constipation and bloating and is currently being worked up by GI for this. 6. Health maintenance. She will proceed to lab today for routine screening blood work (lipids, CBC, CMP, TSH).   Return annually or sooner, prn.  Joseph Pierini MD 04/05/20

## 2020-04-05 NOTE — Addendum Note (Signed)
Addended by: Nelva Nay on: 04/05/2020 03:30 PM   Modules accepted: Orders

## 2020-04-06 LAB — URINALYSIS, COMPLETE W/RFL CULTURE
Bilirubin Urine: NEGATIVE
Glucose, UA: NEGATIVE
Hgb urine dipstick: NEGATIVE
Hyaline Cast: NONE SEEN /LPF
Ketones, ur: NEGATIVE
Leukocyte Esterase: NEGATIVE
Nitrites, Initial: NEGATIVE
RBC / HPF: NONE SEEN /HPF (ref 0–2)
Specific Gravity, Urine: 1.025 (ref 1.001–1.03)
WBC, UA: NONE SEEN /HPF (ref 0–5)
pH: 6 (ref 5.0–8.0)

## 2020-04-06 LAB — URINE CULTURE
MICRO NUMBER:: 10554273
Result:: NO GROWTH
SPECIMEN QUALITY:: ADEQUATE

## 2020-04-06 LAB — COMPREHENSIVE METABOLIC PANEL
AG Ratio: 1.8 (calc) (ref 1.0–2.5)
ALT: 16 U/L (ref 6–29)
AST: 18 U/L (ref 10–35)
Albumin: 3.6 g/dL (ref 3.6–5.1)
Alkaline phosphatase (APISO): 51 U/L (ref 37–153)
BUN: 14 mg/dL (ref 7–25)
CO2: 27 mmol/L (ref 20–32)
Calcium: 9.2 mg/dL (ref 8.6–10.4)
Chloride: 104 mmol/L (ref 98–110)
Creat: 0.88 mg/dL (ref 0.50–1.05)
Globulin: 2 g/dL (calc) (ref 1.9–3.7)
Glucose, Bld: 95 mg/dL (ref 65–99)
Potassium: 3.3 mmol/L — ABNORMAL LOW (ref 3.5–5.3)
Sodium: 142 mmol/L (ref 135–146)
Total Bilirubin: 0.5 mg/dL (ref 0.2–1.2)
Total Protein: 5.6 g/dL — ABNORMAL LOW (ref 6.1–8.1)

## 2020-04-06 LAB — TSH: TSH: 1.78 mIU/L

## 2020-04-06 LAB — CBC
HCT: 42 % (ref 35.0–45.0)
Hemoglobin: 12.8 g/dL (ref 11.7–15.5)
MCH: 21.4 pg — ABNORMAL LOW (ref 27.0–33.0)
MCHC: 30.5 g/dL — ABNORMAL LOW (ref 32.0–36.0)
MCV: 70.1 fL — ABNORMAL LOW (ref 80.0–100.0)
Platelets: 239 10*3/uL (ref 140–400)
RBC: 5.99 10*6/uL — ABNORMAL HIGH (ref 3.80–5.10)
RDW: 15.8 % — ABNORMAL HIGH (ref 11.0–15.0)
WBC: 5.3 10*3/uL (ref 3.8–10.8)

## 2020-04-06 LAB — LIPID PANEL
Cholesterol: 160 mg/dL (ref <200)
HDL: 44 mg/dL — ABNORMAL LOW (ref >=50)
LDL Cholesterol (Calc): 91 mg/dL (calc)
Non-HDL Cholesterol (Calc): 116 mg/dL (calc) (ref <130)
Total CHOL/HDL Ratio: 3.6 (calc) (ref <5.0)
Triglycerides: 158 mg/dL — ABNORMAL HIGH (ref <150)

## 2020-04-06 LAB — CULTURE INDICATED

## 2020-04-08 LAB — PAP IG W/ RFLX HPV ASCU

## 2020-04-18 ENCOUNTER — Ambulatory Visit
Admission: RE | Admit: 2020-04-18 | Discharge: 2020-04-18 | Disposition: A | Discharge status: Home / Self Care | Payer: 59 | Source: Ambulatory Visit | Attending: Internal Medicine | Admitting: Internal Medicine

## 2020-04-18 ENCOUNTER — Other Ambulatory Visit: Payer: Self-pay

## 2020-04-18 DIAGNOSIS — Z1239 Encounter for other screening for malignant neoplasm of breast: Secondary | ICD-10-CM

## 2020-04-26 ENCOUNTER — Other Ambulatory Visit: Payer: Self-pay

## 2020-04-26 ENCOUNTER — Ambulatory Visit (AMBULATORY_SURGERY_CENTER): Payer: Self-pay | Admitting: *Deleted

## 2020-04-26 VITALS — Ht 65.0 in | Wt 189.0 lb

## 2020-04-26 DIAGNOSIS — Z1211 Encounter for screening for malignant neoplasm of colon: Secondary | ICD-10-CM

## 2020-04-26 DIAGNOSIS — Z01818 Encounter for other preprocedural examination: Secondary | ICD-10-CM

## 2020-04-26 MED ORDER — SUTAB 1479-225-188 MG PO TABS
1.0000 | ORAL_TABLET | Freq: Once | ORAL | 0 refills | Status: AC
Start: 2020-04-26 — End: 2020-04-26

## 2020-04-26 NOTE — Progress Notes (Signed)
Takes Linzess once every two weeks per pt  Pt is aware that care partner will wait in the car during procedure; if they feel like they will be too hot or cold to wait in the car; they may wait in the 4 th floor lobby. Patient is aware to bring only one care partner. We want them to wear a mask (we do not have any that we can provide them), practice social distancing, and we will check their temperatures when they get here.  I did remind the patient that their care partner needs to stay in the parking lot the entire time and have a cell phone available, we will call them when the pt is ready for discharge. Patient will wear mask into building.  Pt has chronic constipation- she does not wish to drink Suprep- 2 day Sutab/Miralax prep given   No egg or soy allergy  No home oxygen use   No medications for weight loss taken  emmi information given No trouble with anesthesia, difficulty with intubation or hx/fam hx of malignant hyperthermia per pt  covid test 05-08-20 at 2:30 pm   Sutab code put into RX and paper copy given to pt to show pharmacy

## 2020-05-08 ENCOUNTER — Other Ambulatory Visit: Payer: Self-pay | Admitting: Gastroenterology

## 2020-05-08 ENCOUNTER — Ambulatory Visit (INDEPENDENT_AMBULATORY_CARE_PROVIDER_SITE_OTHER): Payer: 59

## 2020-05-08 DIAGNOSIS — Z1159 Encounter for screening for other viral diseases: Secondary | ICD-10-CM

## 2020-05-09 LAB — SARS CORONAVIRUS 2 (TAT 6-24 HRS): SARS Coronavirus 2: NEGATIVE

## 2020-05-10 ENCOUNTER — Ambulatory Visit (AMBULATORY_SURGERY_CENTER): Payer: 59 | Admitting: Gastroenterology

## 2020-05-10 ENCOUNTER — Other Ambulatory Visit: Payer: Self-pay

## 2020-05-10 ENCOUNTER — Encounter: Payer: Self-pay | Admitting: Gastroenterology

## 2020-05-10 VITALS — BP 118/93 | HR 77 | Temp 97.3°F | Resp 12 | Ht 65.0 in | Wt 189.0 lb

## 2020-05-10 DIAGNOSIS — Z1211 Encounter for screening for malignant neoplasm of colon: Secondary | ICD-10-CM

## 2020-05-10 MED ORDER — SODIUM CHLORIDE 0.9 % IV SOLN: 500.0000 mL | Freq: Once | INTRAVENOUS | Status: DC

## 2020-05-10 NOTE — Progress Notes (Signed)
To PACU, VSS. Report to [email or web address] 

## 2020-05-10 NOTE — Op Note (Signed)
[place] Patient Name: Deanna Rodgers Procedure Date: 05/10/2020 11:14 AM MRN: [id number] Endoscopist: Mallie Mussel L. Loletha Carrow , MD Age: 54 Referring MD:  Date of Birth: [birth year] Gender: Female Account #: [id number] Procedure:                Colonoscopy Indications:              Screening for colorectal malignant neoplasm                            (patient reports screening colonoscopy in [place],                            [place] within last 2 years. Limited history and no                            outside records available, but most likely                            incomplete due to poor prep) Medicines:                Monitored Anesthesia Care Procedure:                Pre-Anesthesia Assessment:                           - Prior to the procedure, a History and Physical                            was performed, and patient medications and                            allergies were reviewed. The patient's tolerance of                            previous anesthesia was also reviewed. The risks                            and benefits of the procedure and the sedation                            options and risks were discussed with the patient.                            All questions were answered, and informed consent                            was obtained. Prior Anticoagulants: The patient has                            taken no previous anticoagulant or antiplatelet                            agents. ASA Grade Assessment: II - A patient with  mild systemic disease. After reviewing the risks                            and benefits, the patient was deemed in                            satisfactory condition to undergo the procedure.                           After obtaining informed consent, the colonoscope                            was passed under direct vision. Throughout the                            procedure, the patient's blood pressure, pulse,  and                            oxygen saturations were monitored continuously. The                            Colonoscope was introduced through the anus and                            advanced to the the cecum, identified by                            appendiceal orifice and ileocecal valve. The                            colonoscopy was performed without difficulty. The                            patient tolerated the procedure well. The quality                            of the bowel preparation was excellent. The                            ileocecal valve, appendiceal orifice, and rectum                            were photographed. The bowel preparation used was 2                            day Suprep/Miralax. Scope In: 11:32:42 AM Scope Out: 11:44:21 AM Scope Withdrawal Time: 0 hours 7 minutes 56 seconds  Total Procedure Duration: 0 hours 11 minutes 39 seconds  Findings:                 The perianal and digital rectal examinations were                            normal.  Multiple diverticula were found in the left colon.                           The exam was otherwise without abnormality on                            direct and retroflexion views. Complications:            No immediate complications. Estimated Blood Loss:     Estimated blood loss: none. Impression:               - Diverticulosis in the left colon.                           - The examination was otherwise normal on direct                            and retroflexion views.                           - No specimens collected. Recommendation:           - Patient has a contact number available for                            emergencies. The signs and symptoms of potential                            delayed complications were discussed with the                            patient. Return to normal activities tomorrow.                            Written discharge instructions were provided to the                             patient.                           - Resume previous diet.                           - Continue present medications.                           - Repeat colonoscopy in 10 years for screening                            purposes.                           - Return to my office at appointment to be                            scheduled for reported chronic bloating and  constipation. If patient can recall prior GI                            practice, records would be helpful, for both                            continuing care of symptoms and colon cancer                            screening.                           - Miralax 1 capful (17 grams) in 8 ounces of water                            PO daily, increase to twice daily if needed to                            reileve constipation. Tama Grosz L. Loletha Carrow, MD 05/10/2020 11:52:47 AM This report has been signed electronically.

## 2020-05-10 NOTE — Patient Instructions (Signed)
Please read handouts provided. Continue present medications. Return to GI office at appointment to be scheduled for chronic bloating and constipation. Miralax 1 capful ( 17 grams ) in 8 ounces of water daily, increase to twice daily if needed to relieve constipation.      YOU HAD AN ENDOSCOPIC PROCEDURE TODAY AT [facility]:   Refer to the procedure report that was given to you for any specific questions about what was found during the examination.  If the procedure report does not answer your questions, please call your gastroenterologist to clarify.  If you requested that your care partner not be given the details of your procedure findings, then the procedure report has been included in a sealed envelope for you to review at your convenience later.  YOU SHOULD EXPECT: Some feelings of bloating in the abdomen. Passage of more gas than usual.  Walking can help get rid of the air that was put into your GI tract during the procedure and reduce the bloating. If you had a lower endoscopy (such as a colonoscopy or flexible sigmoidoscopy) you may notice spotting of blood in your stool or on the toilet paper. If you underwent a bowel prep for your procedure, you may not have a normal bowel movement for a few days.  Please Note:  You might notice some irritation and congestion in your nose or some drainage.  This is from the oxygen used during your procedure.  There is no need for concern and it should clear up in a day or so.  SYMPTOMS TO REPORT IMMEDIATELY:   Following lower endoscopy (colonoscopy or flexible sigmoidoscopy):  Excessive amounts of blood in the stool  Significant tenderness or worsening of abdominal pains  Swelling of the abdomen that is new, acute  Fever of 100F or higher   For urgent or emergent issues, a gastroenterologist can be reached at any hour by calling [phone]. Do not use MyChart messaging for urgent concerns.    DIET:  We do recommend a  small meal at first, but then you may proceed to your regular diet.  Drink plenty of fluids but you should avoid alcoholic beverages for 24 hours.  ACTIVITY:  You should plan to take it easy for the rest of today and you should NOT DRIVE or use heavy machinery until tomorrow (because of the sedation medicines used during the test).    FOLLOW UP: Our staff will call the number listed on your records 48-72 hours following your procedure to check on you and address any questions or concerns that you may have regarding the information given to you following your procedure. If we do not reach you, we will leave a message.  We will attempt to reach you two times.  During this call, we will ask if you have developed any symptoms of COVID 19. If you develop any symptoms (ie: fever, flu-like symptoms, shortness of breath, cough etc.) before then, please call [phone].  If you test positive for Covid 19 in the 2 weeks post procedure, please call and report this information to [place].    If any biopsies were taken you will be contacted by phone or by letter within the next 1-3 weeks.  Please call us at [phone] if you have not heard about the biopsies in 3 weeks.    SIGNATURES/CONFIDENTIALITY: You and/or your care partner have signed paperwork which will be entered into your electronic medical record.  These signatures attest to the fact that that  the information above on your After Visit Summary has been reviewed and is understood.  Full responsibility of the confidentiality of this discharge information lies with you and/or your care-partner.

## 2020-05-10 NOTE — Progress Notes (Signed)
VS by CW  Pt's states no medical or surgical changes since previsit or office visit.  

## 2020-05-14 ENCOUNTER — Telehealth: Payer: Self-pay

## 2020-05-14 ENCOUNTER — Telehealth: Payer: Self-pay | Admitting: *Deleted

## 2020-05-14 NOTE — Telephone Encounter (Signed)
Returning call to advise that she is doing well after her procedure.

## 2020-05-14 NOTE — Telephone Encounter (Signed)
Left message on f/u call 

## 2020-05-14 NOTE — Telephone Encounter (Signed)
Left messaged on 2nd follow up call.

## 2020-06-04 ENCOUNTER — Ambulatory Visit: Payer: 59 | Admitting: Gastroenterology

## 2020-06-04 ENCOUNTER — Telehealth: Payer: Self-pay | Admitting: Internal Medicine

## 2020-06-04 NOTE — Telephone Encounter (Signed)
Pt stated the bp medication hydrochlorothiazide 25mg  is not working her bp is still high. Pt is wondering if she can be prescribed something else? She also wants to know if she was suppose to continue her other bp medication while on this new one?  Her bp today was 190/120   Medication Refill:  Voltaren  Nexium Linaclotide  Pharmacy:   [id number] - [place], [address] AT [address] Phone:  [phone]  Fax:  [phone]

## 2020-06-04 NOTE — Telephone Encounter (Signed)
Last seen on 5/26. She was supposed to have a 6 week f/u. Please have her schedule when able.

## 2020-06-04 NOTE — Telephone Encounter (Signed)
Appointment scheduled for 06/11/20.

## 2020-06-11 ENCOUNTER — Telehealth (INDEPENDENT_AMBULATORY_CARE_PROVIDER_SITE_OTHER): Payer: 59 | Admitting: Internal Medicine

## 2020-06-11 ENCOUNTER — Ambulatory Visit: Payer: Self-pay | Admitting: Internal Medicine

## 2020-06-11 DIAGNOSIS — K581 Irritable bowel syndrome with constipation: Secondary | ICD-10-CM | POA: Diagnosis not present

## 2020-06-11 DIAGNOSIS — K269 Duodenal ulcer, unspecified as acute or chronic, without hemorrhage or perforation: Secondary | ICD-10-CM | POA: Diagnosis not present

## 2020-06-11 DIAGNOSIS — I10 Essential (primary) hypertension: Secondary | ICD-10-CM | POA: Diagnosis not present

## 2020-06-11 DIAGNOSIS — M659 Synovitis and tenosynovitis, unspecified: Secondary | ICD-10-CM | POA: Diagnosis not present

## 2020-06-11 MED ORDER — DICLOFENAC SODIUM 1 % EX GEL: 2.0000 g | Freq: Four times a day (QID) | CUTANEOUS | 1 refills | Status: DC

## 2020-06-11 MED ORDER — ESOMEPRAZOLE MAGNESIUM 20 MG PO CPDR: 20.0000 mg | DELAYED_RELEASE_CAPSULE | Freq: Every day | ORAL | 1 refills | Status: DC

## 2020-06-11 MED ORDER — LINACLOTIDE 72 MCG PO CAPS: 72.0000 ug | ORAL_CAPSULE | Freq: Every day | ORAL | 1 refills | Status: AC

## 2020-06-11 MED ORDER — LISINOPRIL 20 MG PO TABS: 20.0000 mg | ORAL_TABLET | Freq: Every day | ORAL | 1 refills | Status: DC

## 2020-06-11 NOTE — Progress Notes (Signed)
Virtual Visit via Telephone Note  I connected with Deanna Rodgers on 06/11/20 at 11:30 AM EDT by telephone and verified that I am speaking with the correct person using two identifiers.   I discussed the limitations, risks, security and privacy concerns of performing an evaluation and management service by telephone and the availability of in person appointments. I also discussed with the patient that there may be a patient responsible charge related to this service. The patient expressed understanding and agreed to proceed.  Location patient: home Location provider: work office Participants present for the call: patient, provider Patient did not have a visit in the prior 7 days to address this/these issue(s).   History of Present Illness:  She has scheduled this visit to discuss some acute issues  1.  A coworker with whom she was in close contact over the weekend reported that they tested positive for Covid.  Her job automatically put her in a 14-day quarantine.  Neither of them were wearing masks.  She has not yet displayed any symptoms of Covid.  2.  Her blood pressures have remained elevated.  The lowest she has seen  is 409/735 but the diastolic pressure has been as high as 329 and the systolic as high as 924.  She is on hydrochlorothiazide 25 mg.  3.  She needs refills of her Linzess, Nexium and diclofenac gel.   Observations/Objective: Patient sounds cheerful and well on the phone. I do not appreciate any increased work of breathing. Speech and thought processing are grossly intact. Patient reported vitals: Blood pressure today of 174/100   Current Outpatient Medications:  .  amLODipine (NORVASC) 5 MG tablet, Take 1 tablet (5 mg total) by mouth daily. (Patient not taking: Reported on 05/10/2020), Disp: 90 tablet, Rfl: 0 .  diclofenac Sodium (VOLTAREN) 1 % GEL, Apply 2 g topically 4 (four) times daily., Disp: 100 g, Rfl: 1 .  esomeprazole (NEXIUM) 20 MG capsule, Take 1  capsule (20 mg total) by mouth daily at 12 noon., Disp: 90 capsule, Rfl: 1 .  hydrochlorothiazide (HYDRODIURIL) 25 MG tablet, Take 1 tablet (25 mg total) by mouth daily., Disp: 90 tablet, Rfl: 1 .  linaclotide (LINZESS) 72 MCG capsule, Take 1 capsule (72 mcg total) by mouth daily before breakfast., Disp: 90 capsule, Rfl: 1 .  lisinopril (ZESTRIL) 20 MG tablet, Take 1 tablet (20 mg total) by mouth daily., Disp: 90 tablet, Rfl: 1 .  omeprazole (PRILOSEC) 40 MG capsule, Take 40 mg by mouth daily. (Patient not taking: Reported on 05/10/2020), Disp: , Rfl:  .  pregabalin (LYRICA) 50 MG capsule, Take 50 mg by mouth 3 (three) times daily. (Patient not taking: Reported on 04/26/2020), Disp: , Rfl:   Review of Systems:  Constitutional: Denies fever, chills, diaphoresis, appetite change and fatigue.  HEENT: Denies photophobia, eye pain, redness, hearing loss, ear pain, congestion, sore throat, rhinorrhea, sneezing, mouth sores, trouble swallowing, neck pain, neck stiffness and tinnitus.   Respiratory: Denies SOB, DOE, cough, chest tightness,  and wheezing.   Cardiovascular: Denies chest pain, palpitations and leg swelling.  Gastrointestinal: Denies nausea, vomiting, abdominal pain, diarrhea, constipation, blood in stool and abdominal distention.  Genitourinary: Denies dysuria, urgency, frequency, hematuria, flank pain and difficulty urinating.  Endocrine: Denies: hot or cold intolerance, sweats, changes in hair or nails, polyuria, polydipsia. Musculoskeletal: Denies myalgias, back pain, joint swelling, arthralgias and gait problem.  Skin: Denies pallor, rash and wound.  Neurological: Denies dizziness, seizures, syncope, weakness, light-headedness, numbness and headaches.  Hematological: Denies adenopathy. Easy bruising, personal or family bleeding history  Psychiatric/Behavioral: Denies suicidal ideation, mood changes, confusion, nervousness, sleep disturbance and agitation   Assessment and  Plan:  Essential hypertension -Blood pressure is not at goal. -Continue hydrochlorothiazide 25 mg and add lisinopril 20 mg. -She will continue to do ambulatory blood pressure monitoring and return in 6 weeks for blood pressure check in basic metabolic profile to follow renal function and electrolytes.  Covid exposure -Have advised her to get tested at a local pharmacy. -If positive she would likely qualify for monoclonal antibody infusion.  Synovitis of knee  - Plan: diclofenac Sodium (VOLTAREN) 1 % GEL  Duodenal ulcer without hemorrhage or perforation and without obstruction  - Plan: esomeprazole (NEXIUM) 20 MG capsule  Irritable bowel syndrome with constipation  - Plan: linaclotide (LINZESS) 72 MCG capsule    I discussed the assessment and treatment plan with the patient. The patient was provided an opportunity to ask questions and all were answered. The patient agreed with the plan and demonstrated an understanding of the instructions.   The patient was advised to call back or seek an in-person evaluation if the symptoms worsen or if the condition fails to improve as anticipated.  I provided 22 minutes of non-face-to-face time during this encounter.   Lelon Frohlich, MD [facility]

## 2020-08-28 ENCOUNTER — Ambulatory Visit (INDEPENDENT_AMBULATORY_CARE_PROVIDER_SITE_OTHER): Payer: 59 | Admitting: Gastroenterology

## 2020-08-28 ENCOUNTER — Ambulatory Visit: Payer: 59 | Admitting: Gastroenterology

## 2020-08-28 ENCOUNTER — Encounter: Payer: Self-pay | Admitting: Gastroenterology

## 2020-08-28 VITALS — BP 126/60 | HR 91 | Ht 63.0 in | Wt 179.0 lb

## 2020-08-28 DIAGNOSIS — K5904 Chronic idiopathic constipation: Secondary | ICD-10-CM | POA: Diagnosis not present

## 2020-08-28 DIAGNOSIS — R140 Abdominal distension (gaseous): Secondary | ICD-10-CM

## 2020-08-28 NOTE — Progress Notes (Signed)
GI Progress Note  Chief Complaint: Chronic constipation and bloating  Subjective  History: Patient was seen for screening colonoscopy 05/10/2020.  History somewhat unclear, but she reported a prior colonoscopy perhaps 2 years prior.  No report was available, sounded like perhaps poor bowel preparation. On the exam this past July, 2-day preparation with good results, complete exam, no polyps.  Left colon diverticulosis seen.  She then requested office follow-up for chronic constipation and bloating.  MiraLAX recommended. (30 minutes late to appointment today, so given an appointment later in the morning)  Rhona's history is unclear, but she seems to have many years of chronic constipation, though worse in the last 2 to 3 years.  She reports going at least days but sometimes weeks without a bowel movement.  She is most bothered by the bloating and visible asymmetric distention that only briefly improves if she takes Linzess as needed, and recurs almost immediately after a bowel movement.  The Linzess also causes loose stool so she does not like it much. She lives in the [place] area, started getting her health care here earlier this year because she plans to move back to the area.  Now sees Dr. Isaac Bliss for primary care.  ROS: Cardiovascular:  no chest pain Respiratory: no dyspnea Remainder of systems negative except as above The patient's Past Medical, Family and Social History were reviewed and are on file in the EMR.  Objective:  Med list reviewed  Current Outpatient Medications:  .  diclofenac Sodium (VOLTAREN) 1 % GEL, Apply 2 g topically 4 (four) times daily., Disp: 100 g, Rfl: 1 .  esomeprazole (NEXIUM) 20 MG capsule, Take 1 capsule (20 mg total) by mouth daily at 12 noon., Disp: 90 capsule, Rfl: 1 .  hydrochlorothiazide (HYDRODIURIL) 25 MG tablet, Take 1 tablet (25 mg total) by mouth daily., Disp: 90 tablet, Rfl: 1 .  linaclotide (LINZESS) 72 MCG  capsule, Take 1 capsule (72 mcg total) by mouth daily before breakfast., Disp: 90 capsule, Rfl: 1 .  lisinopril (ZESTRIL) 20 MG tablet, Take 1 tablet (20 mg total) by mouth daily., Disp: 90 tablet, Rfl: 1 .  pregabalin (LYRICA) 50 MG capsule, Take 50 mg by mouth 3 (three) times daily. , Disp: , Rfl:    Vital signs in last 24 hrs: Vitals:   08/28/20 1028  BP: 126/60  Pulse: 91  SpO2: 98%    Physical Exam  Well-appearing  HEENT: sclera anicteric, oral mucosa moist without lesions  Neck: supple, no thyromegaly, JVD or lymphadenopathy  Cardiac: RRR without murmurs, S1S2 heard, no peripheral edema  Pulm: clear to auscultation bilaterally, normal RR and effort noted  Abdomen: soft, no tenderness, with active bowel sounds. No guarding or palpable hepatosplenomegaly. C sxn scar (mid 80's).  Obese, right upper abdominal wall mildly more prominent than left  Skin; warm and dry, no jaundice or rash  Labs:  CBC Latest Ref Rng & Units 04/05/2020 10/22/2015 08/14/2013  WBC 3.8 - 10.8 Thousand/uL 5.3 3.8(L) 8.4  Hemoglobin 11.7 - 15.5 g/dL 12.8 11.0(L) 8.5(L)  Hematocrit 35 - 45 % 42.0 35.5(L) 26.4(L)  Platelets 140 - 400 Thousand/uL 239 188 132(L)   CMP Latest Ref Rng & Units 04/05/2020 10/22/2015 08/12/2013  Glucose 65 - 99 mg/dL 95 94 105(H)  BUN 7 - 25 mg/dL 14 12 12   Creatinine 0.50 - 1.05 mg/dL 0.88 0.53 0.65  Sodium 135 - 146 mmol/L 142 141 134(L)  Potassium 3.5 - 5.3 mmol/L 3.3(L) 3.8 3.4(L)  Chloride 98 - 110 mmol/L 104 112(H) 104  CO2 20 - 32 mmol/L 27 23 25   Calcium 8.6 - 10.4 mg/dL 9.2 8.3(L) 7.7(L)  Total Protein 6.1 - 8.1 g/dL 5.6(L) 7.0 -  Total Bilirubin 0.2 - 1.2 mg/dL 0.5 0.7 -  Alkaline Phos 38 - 126 U/L - 66 -  AST 10 - 35 U/L 18 14(L) -  ALT 6 - 29 U/L 16 13(L) -   Lab Results  Component Value Date   TSH 1.78 04/05/2020    ___________________________________________ Radiologic  studies:   ____________________________________________ Other:   _____________________________________________ Assessment & Plan  Assessment: Encounter Diagnoses  Name Primary?  . Chronic idiopathic constipation Yes  . Abdominal bloating    I think she has chronic idiopathic constipation. MiraLAX and Linzess have not been very helpful.  They might cause some bowel movement but is usually loose and does not improve the distention. Less likely bacterial overgrowth  Plan: Trial of Motegrity 2 mg daily, 2 weeks of samples given.  Asked her to call with update at the end of that trial, or sooner if needed, so we can then decide how to proceed with this medicine.  If not much better, may need anorectal manometry.  Clinical history much more consistent with functional constipation then PFD.  30 minutes were spent on this encounter (including chart review, history/exam, counseling/coordination of care, and documentation)  Nelida Meuse III

## 2020-08-28 NOTE — Patient Instructions (Signed)
If you are age 52 or older, your body mass index should be between 23-30. Your Body mass index is 31.71 kg/m. If this is out of the aforementioned range listed, please consider follow up with your Primary Care Provider.  If you are age 66 or younger, your body mass index should be between 19-25. Your Body mass index is 31.71 kg/m. If this is out of the aformentioned range listed, please consider follow up with your Primary Care Provider.   Medication Samples have been provided to the patient.  Drug name: Motegrity       Strength: 2mg         Qty: 14  LOT: [id number]  Exp.Date: 12-2020  Dosing instructions: one a day  The patient has been instructed regarding the correct time, dose, and frequency of taking this medication, including desired effects and most common side effects.   Call or send a mychart message and give an update after using samples  It was a pleasure to see you today!  Dr. Loletha Carrow

## 2020-10-03 ENCOUNTER — Telehealth: Payer: Self-pay | Admitting: Gastroenterology

## 2020-10-03 NOTE — Telephone Encounter (Signed)
Called and tried to reach patient for a detailed update. No answer

## 2020-10-03 NOTE — Telephone Encounter (Signed)
Pt is requesting a call back from a nurse to be prescribed Motegrity, pt received samples for this and it worked for her. Pharmacy: [facility]

## 2020-10-03 NOTE — Telephone Encounter (Signed)
Pt is requesting a call back (missed call) 

## 2020-10-07 NOTE — Telephone Encounter (Signed)
No answer and the voicemail box is full.

## 2020-10-14 ENCOUNTER — Other Ambulatory Visit: Payer: Self-pay

## 2020-10-14 MED ORDER — MOTEGRITY 2 MG PO TABS: 2.0000 mg | ORAL_TABLET | Freq: Every day | ORAL | 3 refills | Status: AC

## 2020-10-15 ENCOUNTER — Other Ambulatory Visit: Payer: Self-pay | Admitting: Gastroenterology

## 2020-10-17 NOTE — Telephone Encounter (Signed)
PA has been submitted with cover my meds. 

## 2020-10-28 ENCOUNTER — Other Ambulatory Visit: Payer: Self-pay | Admitting: Gastroenterology

## 2020-10-28 ENCOUNTER — Telehealth: Payer: Self-pay | Admitting: Internal Medicine

## 2020-10-28 DIAGNOSIS — I10 Essential (primary) hypertension: Secondary | ICD-10-CM

## 2020-11-13 ENCOUNTER — Other Ambulatory Visit: Payer: Self-pay

## 2020-11-13 ENCOUNTER — Telehealth: Payer: Self-pay | Admitting: Internal Medicine

## 2020-11-13 ENCOUNTER — Telehealth (INDEPENDENT_AMBULATORY_CARE_PROVIDER_SITE_OTHER): Payer: 59 | Admitting: Internal Medicine

## 2020-11-13 DIAGNOSIS — Z96652 Presence of left artificial knee joint: Secondary | ICD-10-CM

## 2020-11-13 DIAGNOSIS — T8484XD Pain due to internal orthopedic prosthetic devices, implants and grafts, subsequent encounter: Secondary | ICD-10-CM

## 2020-11-13 DIAGNOSIS — K219 Gastro-esophageal reflux disease without esophagitis: Secondary | ICD-10-CM

## 2020-11-13 DIAGNOSIS — Z96659 Presence of unspecified artificial knee joint: Secondary | ICD-10-CM

## 2020-11-13 DIAGNOSIS — T84018A Broken internal joint prosthesis, other site, initial encounter: Secondary | ICD-10-CM | POA: Diagnosis not present

## 2020-11-13 DIAGNOSIS — R600 Localized edema: Secondary | ICD-10-CM

## 2020-11-13 DIAGNOSIS — I10 Essential (primary) hypertension: Secondary | ICD-10-CM | POA: Diagnosis not present

## 2020-11-13 DIAGNOSIS — K261 Acute duodenal ulcer with perforation: Secondary | ICD-10-CM

## 2020-11-13 MED ORDER — LISINOPRIL 20 MG PO TABS: 20.0000 mg | ORAL_TABLET | Freq: Every day | ORAL | 1 refills | Status: DC

## 2020-11-13 MED ORDER — TRAMADOL HCL 50 MG PO TABS: 50.0000 mg | ORAL_TABLET | Freq: Two times a day (BID) | ORAL | 0 refills | Status: DC | PRN

## 2020-11-13 MED ORDER — HYDROCHLOROTHIAZIDE 25 MG PO TABS: 25.0000 mg | ORAL_TABLET | Freq: Every day | ORAL | 1 refills | Status: DC

## 2020-11-13 NOTE — Telephone Encounter (Signed)
Pt is returning a call to the office and would like to have a call at 11:00 or after 3:00 due to her being at work and unable to accept the call.

## 2020-11-13 NOTE — Progress Notes (Signed)
Virtual Visit via Video Note  I connected with Deanna Rodgers on 11/13/20 at  8:00 AM EST by a video enabled telemedicine application and verified that I am speaking with the correct person using two identifiers.  Location patient: home Location provider: work office Persons participating in the virtual visit: patient, provider  I discussed the limitations of evaluation and management by telemedicine and the availability of in person appointments. The patient expressed understanding and agreed to proceed.   HPI: She has scheduled this video visit to discuss 2 main issues:  1.  She needs refills of her blood pressure medication.  She is currently on hydrochlorothiazide 25 mg and lisinopril 20 mg daily.  She tells me that her blood pressures at home have been around the 1 81-4 60 range systolic.  She denies chest pain, shortness of breath or any other major symptoms.  2. she has had a failed left knee replacement and tells me that she has seen multiple orthopedists, most recently in [place].  She has been told that there is nothing further they can do for it.  She has started a 10-hour a day job that is standing, at the end of the day her knee is swollen and quite painful.  She is requesting some short and possibly long-term pain medication for it.   ROS: Constitutional: Denies fever, chills, diaphoresis, appetite change and fatigue.  HEENT: Denies photophobia, eye pain, redness, hearing loss, ear pain, congestion, sore throat, rhinorrhea, sneezing, mouth sores, trouble swallowing, neck pain, neck stiffness and tinnitus.   Respiratory: Denies SOB, DOE, cough, chest tightness,  and wheezing.   Cardiovascular: Denies chest pain, palpitations and leg swelling.  Gastrointestinal: Denies nausea, vomiting, abdominal pain, diarrhea, constipation, blood in stool and abdominal distention.  Genitourinary: Denies dysuria, urgency, frequency, hematuria, flank pain and difficulty urinating.   Endocrine: Denies: hot or cold intolerance, sweats, changes in hair or nails, polyuria, polydipsia. Musculoskeletal: Denies myalgias, back pain, joint swelling, arthralgias and gait problem.  Skin: Denies pallor, rash and wound.  Neurological: Denies dizziness, seizures, syncope, weakness, light-headedness, numbness and headaches.  Hematological: Denies adenopathy. Easy bruising, personal or family bleeding history  Psychiatric/Behavioral: Denies suicidal ideation, mood changes, confusion, nervousness, sleep disturbance and agitation   Past Medical History:  Diagnosis Date  . Abnormal Pap smear    bx  . Anemia   . Anxiety   . 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 Blood transfusion without reported diagnosis   . Depression   . GERD (gastroesophageal reflux disease)   . History of duodenal ulcer   . History of gastroesophageal reflux (GERD)   . Hypertension   . Pain    LOWER BACK     Past Surgical History:  Procedure Laterality Date  . CESAREAN SECTION  12-24-11   '85- x1  . COLONOSCOPY    . ENDOMETRIAL ABLATION    . hemorroidectomy  yrs ago  . JOINT REPLACEMENT  12/2011   rt total knee  . KNEE ARTHROPLASTY  12/25/2011   Procedure: COMPUTER ASSISTED TOTAL KNEE ARTHROPLASTY;  Surgeon: Mcarthur Rossetti, MD;  Location: [facility];  Service: Orthopedics;  Laterality: Right;  . KNEE ARTHROSCOPY  12-24-11   x2 -[facility]  . KNEE ARTHROSCOPY Right 12/23/2012   Procedure: Right Knee Arthroscopy with minimal Debridement ;  Surgeon: Mcarthur Rossetti, MD;  Location: [facility];  Service: Orthopedics;  Laterality: Right;  Right Knee Arthroscopy with minimal  Debridement   . TOTAL KNEE REVISION Right 04/14/2013   Procedure: Synovectomy right total knee with poly exchange;  Surgeon: Mcarthur Rossetti, MD;  Location: [facility];  Service: Orthopedics;  Laterality: Right;  .  TOTAL KNEE REVISION Right 08/11/2013   Procedure: REVISION RIGHT TOTAL KNEE ARTHROPLASTY;  Surgeon: Mcarthur Rossetti, MD;  Location: [facility];  Service: Orthopedics;  Laterality: Right;  . TUBAL LIGATION    . UPPER GASTROINTESTINAL ENDOSCOPY      Family History  Problem Relation Age of Onset  . Hypertension Maternal Grandmother   . Stroke Maternal Grandmother   . Hypertension Father   . Stroke Father   . Hypertension Maternal Aunt   . Cancer Maternal Uncle   . Cancer Paternal Uncle        Colon  . Colon cancer Neg Hx   . Esophageal cancer Neg Hx   . Rectal cancer Neg Hx   . Stomach cancer Neg Hx     SOCIAL HX:   reports that she has never smoked. She has never used smokeless tobacco. She reports that she does not drink alcohol and does not use drugs.   Current Outpatient Medications:  .  traMADol (ULTRAM) 50 MG tablet, Take 1 tablet (50 mg total) by mouth every 12 (twelve) hours as needed for severe pain., Disp: 60 tablet, Rfl: 0 .  diclofenac Sodium (VOLTAREN) 1 % GEL, Apply 2 g topically 4 (four) times daily., Disp: 100 g, Rfl: 1 .  esomeprazole (NEXIUM) 20 MG capsule, Take 1 capsule (20 mg total) by mouth daily at 12 noon., Disp: 90 capsule, Rfl: 1 .  hydrochlorothiazide (HYDRODIURIL) 25 MG tablet, Take 1 tablet (25 mg total) by mouth daily., Disp: 90 tablet, Rfl: 1 .  linaclotide (LINZESS) 72 MCG capsule, Take 1 capsule (72 mcg total) by mouth daily before breakfast., Disp: 90 capsule, Rfl: 1 .  lisinopril (ZESTRIL) 20 MG tablet, Take 1 tablet (20 mg total) by mouth daily., Disp: 90 tablet, Rfl: 1 .  pregabalin (LYRICA) 50 MG capsule, Take 50 mg by mouth 3 (three) times daily. , Disp: , Rfl:  .  Prucalopride Succinate (MOTEGRITY) 2 MG TABS, Take 1 tablet (2 mg total) by mouth daily., Disp: 30 tablet, Rfl: 3  EXAM:   VITALS per patient if applicable: None reported  GENERAL: alert, oriented, appears well and in no acute distress  HEENT: atraumatic, conjunttiva clear,  no obvious abnormalities on inspection of external nose and ears  NECK: normal movements of the head and neck  LUNGS: on inspection no signs of respiratory distress, breathing rate appears normal, no obvious gross increased work of breathing, gasping or wheezing  CV: no obvious cyanosis  MS: moves all visible extremities without noticeable abnormality  PSYCH/NEURO: pleasant and cooperative, no obvious depression or anxiety, speech and thought processing grossly intact  ASSESSMENT AND PLAN:   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  Over the past 12 months she has had 2 prescriptions for Tramadol and 20 tablets of oxycodone. -We have discussed chronic pain management.  I have told her that in order for me to consider chronic pain contract I will need to see her most recent notes from orthopedics which she will get to me as they have most recently been from [place].  She will also need  to come in and sign a pain contract. -She will come in the office in 4 to 6 weeks for follow-up.  I will go ahead and give her 60 tablets of tramadol for now.  No further prescriptions until seen in office.  I believe this is reasonable as her history of duodenal ulcers and GERD precludes NSAID treatment.  Primary hypertension -Per report it appears her blood pressures are still elevated. -I will refill hydrochlorothiazide and lisinopril as requested, repeat blood pressure in the office when she returns in 6 weeks.  Gastroesophageal reflux disease without esophagitis Acute duodenal ulcer with perforation (HCC) -Continue PPI therapy and GI follow-up as scheduled.     I discussed the assessment and treatment plan with the patient. The patient was provided an opportunity to ask questions and all were answered. The patient agreed with the plan and demonstrated an  understanding of the instructions.   The patient was advised to call back or seek an in-person evaluation if the symptoms worsen or if the condition fails to improve as anticipated.    Lelon Frohlich, MD  [facility]

## 2020-11-13 NOTE — Telephone Encounter (Signed)
She was sent Rx today after her VV...can we find out what the issue is?

## 2020-11-13 NOTE — Telephone Encounter (Signed)
Pt is calling in stating that she needs a new prescription for Tramadol (ULTRAM) 50 MG #14 twice a day for 7 days is what the pharmacy is letting her know and she would like to see if she could get refills on it with the new change.  [facility]arm:  [facility]

## 2020-11-15 MED ORDER — TRAMADOL HCL 50 MG PO TABS: 50.0000 mg | ORAL_TABLET | Freq: Two times a day (BID) | ORAL | 2 refills | Status: DC | PRN

## 2020-11-15 NOTE — Telephone Encounter (Signed)
Pt is calling in to see if Dr. Jerilee Hoh will send in a new prescription for #14 due to the insurance will only fill that amount only.  [facility] [place] [place]

## 2020-11-15 NOTE — Telephone Encounter (Signed)
DO you mind pending and deleting prior Rx?

## 2020-12-17 ENCOUNTER — Other Ambulatory Visit: Payer: Self-pay | Admitting: Internal Medicine

## 2020-12-17 DIAGNOSIS — Z96659 Presence of unspecified artificial knee joint: Secondary | ICD-10-CM

## 2020-12-17 DIAGNOSIS — Z96652 Presence of left artificial knee joint: Secondary | ICD-10-CM

## 2020-12-17 DIAGNOSIS — T84018A Broken internal joint prosthesis, other site, initial encounter: Secondary | ICD-10-CM

## 2020-12-17 DIAGNOSIS — T8484XD Pain due to internal orthopedic prosthetic devices, implants and grafts, subsequent encounter: Secondary | ICD-10-CM

## 2020-12-25 ENCOUNTER — Ambulatory Visit (INDEPENDENT_AMBULATORY_CARE_PROVIDER_SITE_OTHER): Payer: 59 | Admitting: Internal Medicine

## 2020-12-25 ENCOUNTER — Encounter: Payer: Self-pay | Admitting: Internal Medicine

## 2020-12-25 ENCOUNTER — Ambulatory Visit: Payer: 59 | Admitting: Internal Medicine

## 2020-12-25 ENCOUNTER — Other Ambulatory Visit: Payer: Self-pay

## 2020-12-25 VITALS — BP 150/90 | HR 86 | Temp 98.6°F | Wt 172.5 lb

## 2020-12-25 DIAGNOSIS — I10 Essential (primary) hypertension: Secondary | ICD-10-CM

## 2020-12-25 MED ORDER — LISINOPRIL 40 MG PO TABS: 40.0000 mg | ORAL_TABLET | Freq: Every day | ORAL | 1 refills | Status: DC

## 2020-12-25 NOTE — Patient Instructions (Signed)
-  Nice seeing you today!!  -increase lisinopril from 20 to 40 mg daily.  -Continue HCTZ 25 mg daily.  -Schedule follow up in 6-8 weeks for your blood pressure.

## 2020-12-25 NOTE — Progress Notes (Signed)
Established Patient Office Visit     This visit occurred during the SARS-CoV-2 public health emergency.  Safety protocols were in place, including screening questions prior to the visit, additional usage of staff PPE, and extensive cleaning of exam room while observing appropriate contact time as indicated for disinfecting solutions.    CC/Reason for Visit: Follow-up blood pressure  HPI: Deanna Rodgers is a 55 y.o. female who is coming in today for the above mentioned reasons.  She is here today to follow-up on her hypertension.  She is on hydrochlorothiazide 25 mg daily as well as lisinopril 20 mg daily.  She states she is compliant with medications, states she eats a low-sodium diet.  Blood pressure remains uncontrolled.  She denies chest pain, shortness of breath, dizziness, lightheadedness or focal neurologic deficits.  Blood pressure in office today is 150/90.   Past Medical/Surgical History: Past Medical History:  Diagnosis Date  . Abnormal Pap smear    bx  . Anemia   . Anxiety   . 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 Blood transfusion without reported diagnosis   . Depression   . GERD (gastroesophageal reflux disease)   . History of duodenal ulcer   . History of gastroesophageal reflux (GERD)   . Hypertension   . Pain    LOWER BACK     Past Surgical History:  Procedure Laterality Date  . CESAREAN SECTION  12-24-11   '85- x1  . COLONOSCOPY    . ENDOMETRIAL ABLATION    . hemorroidectomy  yrs ago  . JOINT REPLACEMENT  12/2011   rt total knee  . KNEE ARTHROPLASTY  12/25/2011   Procedure: COMPUTER ASSISTED TOTAL KNEE ARTHROPLASTY;  Surgeon: Mcarthur Rossetti, MD;  Location: [facility];  Service: Orthopedics;  Laterality: Right;  . KNEE ARTHROSCOPY  12-24-11   [facility] -[facility]  . KNEE ARTHROSCOPY Right 12/23/2012   Procedure: Right Knee Arthroscopy  with minimal Debridement ;  Surgeon: Mcarthur Rossetti, MD;  Location: [facility];  Service: Orthopedics;  Laterality: Right;  Right Knee Arthroscopy with minimal  Debridement   . TOTAL KNEE REVISION Right 04/14/2013   Procedure: Synovectomy right total knee with poly exchange;  Surgeon: Mcarthur Rossetti, MD;  Location: [facility];  Service: Orthopedics;  Laterality: Right;  . TOTAL KNEE REVISION Right 08/11/2013   Procedure: REVISION RIGHT TOTAL KNEE ARTHROPLASTY;  Surgeon: Mcarthur Rossetti, MD;  Location: [facility];  Service: Orthopedics;  Laterality: Right;  . TUBAL LIGATION    . UPPER GASTROINTESTINAL ENDOSCOPY      Social History:  reports that she has never smoked. She has never used smokeless tobacco. She reports that she does not drink alcohol and does not use drugs.  Allergies: No Known Allergies  Family History:  Family History  Problem Relation Age of Onset  . Hypertension Maternal Grandmother   . Stroke Maternal Grandmother   . Hypertension Father   . Stroke Father   . Hypertension Maternal Aunt   . Cancer Maternal Uncle   . Cancer Paternal Uncle        Colon  . Colon cancer Neg Hx   . Esophageal cancer Neg Hx   . Rectal cancer Neg Hx   . Stomach cancer Neg Hx      Current Outpatient Medications:  .  diclofenac Sodium (VOLTAREN) 1 % GEL, Apply  2 g topically 4 (four) times daily., Disp: 100 g, Rfl: 1 .  esomeprazole (NEXIUM) 20 MG capsule, Take 1 capsule (20 mg total) by mouth daily at 12 noon., Disp: 90 capsule, Rfl: 1 .  hydrochlorothiazide (HYDRODIURIL) 25 MG tablet, Take 1 tablet (25 mg total) by mouth daily., Disp: 90 tablet, Rfl: 1 .  linaclotide (LINZESS) 72 MCG capsule, Take 1 capsule (72 mcg total) by mouth daily before breakfast., Disp: 90 capsule, Rfl: 1 .  pregabalin (LYRICA) 50 MG capsule, Take 50 mg by mouth 3 (three) times daily. , Disp: , Rfl:  .  traMADol (ULTRAM) 50 MG tablet, TAKE 1 TABLET (50 MG TOTAL) BY MOUTH EVERY 12 (TWELVE) HOURS AS  NEEDED FOR SEVERE PAIN., Disp: 14 tablet, Rfl: 1 .  lisinopril (ZESTRIL) 40 MG tablet, Take 1 tablet (40 mg total) by mouth daily., Disp: 90 tablet, Rfl: 1 .  Prucalopride Succinate (MOTEGRITY) 2 MG TABS, Take 1 tablet (2 mg total) by mouth daily., Disp: 30 tablet, Rfl: 3  Review of Systems:  Constitutional: Denies fever, chills, diaphoresis, appetite change and fatigue.  HEENT: Denies photophobia, eye pain, redness, hearing loss, ear pain, congestion, sore throat, rhinorrhea, sneezing, mouth sores, trouble swallowing, neck pain, neck stiffness and tinnitus.   Respiratory: Denies SOB, DOE, cough, chest tightness,  and wheezing.   Cardiovascular: Denies chest pain, palpitations and leg swelling.  Gastrointestinal: Denies nausea, vomiting, abdominal pain, diarrhea, constipation, blood in stool and abdominal distention.  Genitourinary: Denies dysuria, urgency, frequency, hematuria, flank pain and difficulty urinating.  Endocrine: Denies: hot or cold intolerance, sweats, changes in hair or nails, polyuria, polydipsia. Musculoskeletal: Denies myalgias, back pain, joint swelling, arthralgias and gait problem.  Skin: Denies pallor, rash and wound.  Neurological: Denies dizziness, seizures, syncope, weakness, light-headedness, numbness and headaches.  Hematological: Denies adenopathy. Easy bruising, personal or family bleeding history  Psychiatric/Behavioral: Denies suicidal ideation, mood changes, confusion, nervousness, sleep disturbance and agitation    Physical Exam: Vitals:   12/25/20 1506  BP: (!) 150/90  Pulse: 86  Temp: 98.6 F (37 C)  TempSrc: Oral  SpO2: 99%  Weight: 172 lb 8 oz (78.2 kg)    Body mass index is 30.56 kg/m.  Constitutional: NAD, calm, comfortable Eyes: PERRL, lids and conjunctivae normal ENMT: Mucous membranes are moist.  Respiratory: clear to auscultation bilaterally, no wheezing, no crackles. Normal respiratory effort. No accessory muscle use.   Cardiovascular: Regular rate and rhythm, no murmurs / rubs / gallops. No extremity edema.  Neurologic: Grossly intact and nonfocal. Psychiatric: Normal judgment and insight. Alert and oriented x 3. Normal mood.    Impression and Plan:  Essential hypertension  -Increase lisinopril from 20 to 40 mg, continue hydrochlorothiazide 25 mg daily. -She will return in 6 to 8 weeks for follow-up.   Patient Instructions  -Nice seeing you today!!  -increase lisinopril from 20 to 40 mg daily.  -Continue HCTZ 25 mg daily.  -Schedule follow up in 6-8 weeks for your blood pressure.     Lelon Frohlich, MD [facility]

## 2021-01-16 ENCOUNTER — Other Ambulatory Visit: Payer: Self-pay | Admitting: Internal Medicine

## 2021-01-16 DIAGNOSIS — T8484XD Pain due to internal orthopedic prosthetic devices, implants and grafts, subsequent encounter: Secondary | ICD-10-CM

## 2021-01-16 DIAGNOSIS — T84018A Broken internal joint prosthesis, other site, initial encounter: Secondary | ICD-10-CM

## 2021-01-16 DIAGNOSIS — Z96659 Presence of unspecified artificial knee joint: Secondary | ICD-10-CM

## 2021-01-16 DIAGNOSIS — Z96652 Presence of left artificial knee joint: Secondary | ICD-10-CM

## 2021-03-22 ENCOUNTER — Other Ambulatory Visit: Payer: Self-pay | Admitting: Internal Medicine

## 2021-03-22 DIAGNOSIS — T84018A Broken internal joint prosthesis, other site, initial encounter: Secondary | ICD-10-CM

## 2021-03-22 DIAGNOSIS — Z96652 Presence of left artificial knee joint: Secondary | ICD-10-CM

## 2021-03-22 DIAGNOSIS — Z96659 Presence of unspecified artificial knee joint: Secondary | ICD-10-CM

## 2021-03-22 DIAGNOSIS — T8484XD Pain due to internal orthopedic prosthetic devices, implants and grafts, subsequent encounter: Secondary | ICD-10-CM

## 2021-04-29 ENCOUNTER — Other Ambulatory Visit: Payer: Self-pay | Admitting: Internal Medicine

## 2021-04-29 DIAGNOSIS — M659 Synovitis and tenosynovitis, unspecified: Secondary | ICD-10-CM

## 2022-01-10 IMAGING — MR MR LUMBAR SPINE W/O CM
5 series · 45 of 48 positions shown · non-contrast
Comparison: 02/18/2010 lumbar spine radiographs. 10/12/2008 lumbar
spine MRI.

CLINICAL DATA: Lumbar radiculopathy.

EXAM:
MRI LUMBAR SPINE WITHOUT CONTRAST
TECHNIQUE: Multiplanar, multisequence MR imaging of the lumbar spine was
performed. No intravenous contrast was administered.

[Series 3: T2 post-contrast · sagittal · 4.0mm · 0.88mm/px · 5 of 12 slices shown]
[im 1/12]
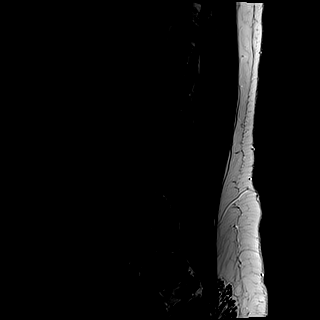
[im 3/12]
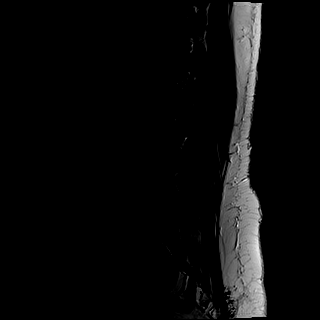
[im 6/12]
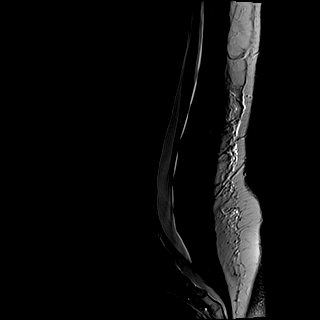
[im 9/12]
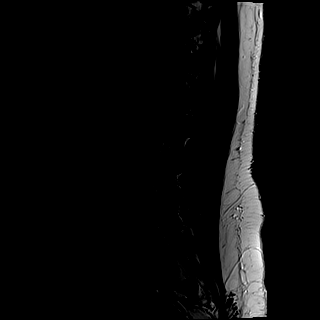
[im 12/12]
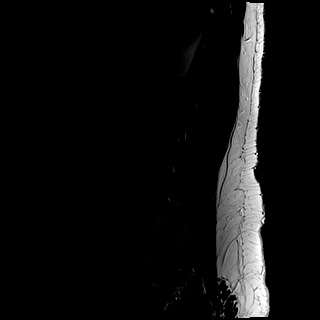

[Series 4: T1 · sagittal · 4.0mm · 0.88mm/px · 5 of 12 slices shown (1 of 2)]
[im 1/12]
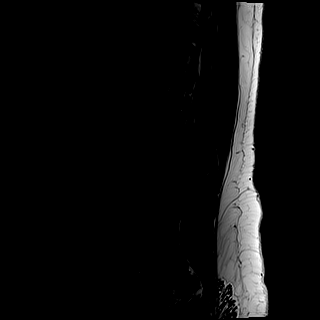
[im 3/12]
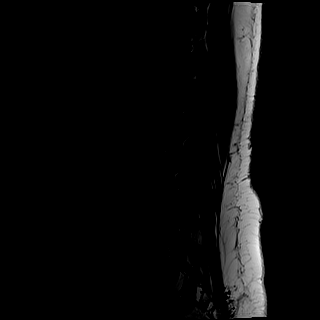
[im 6/12]
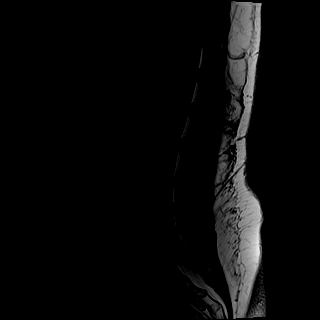
[im 9/12]
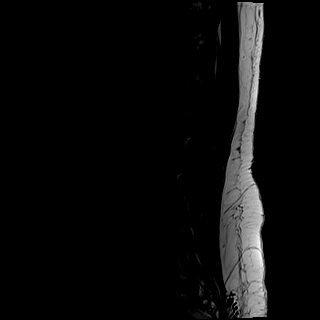
[im 12/12]
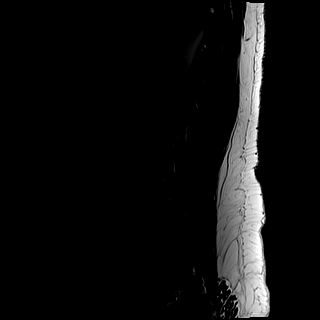

[Series 5: tirm sag · sagittal · 4.0mm · 0.55mm/px · 6 of 12 slices shown]
[im 1/12]
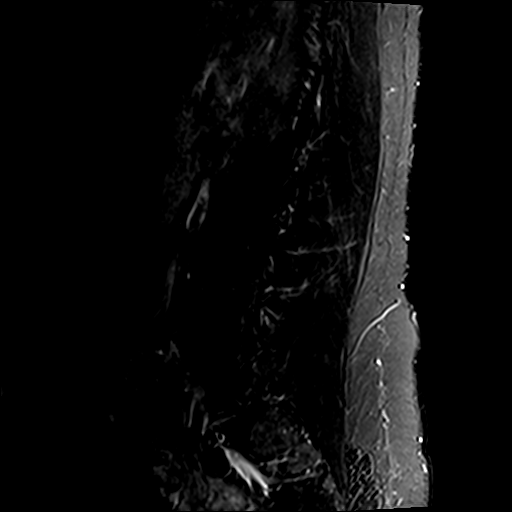
[im 3/12]
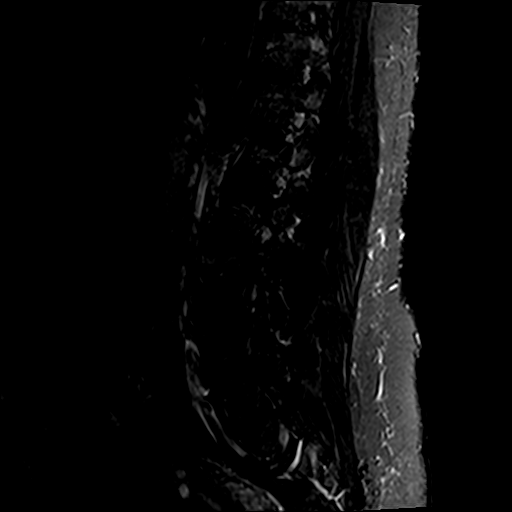
[im 5/12]
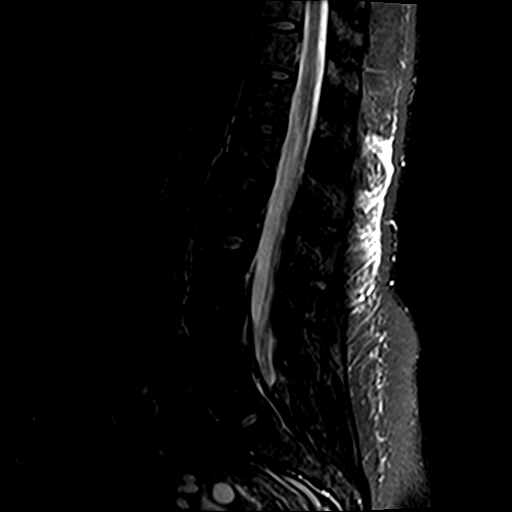
[im 7/12]
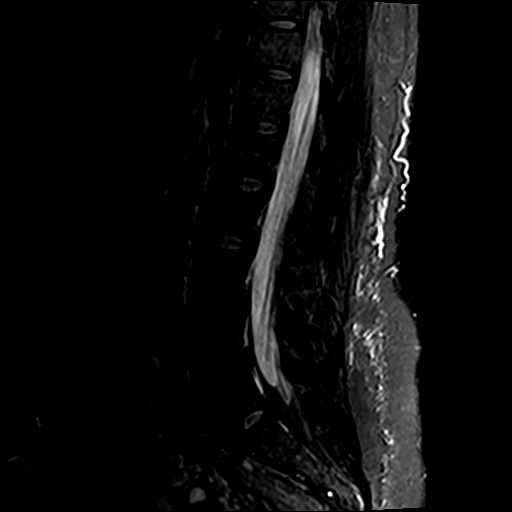
[im 9/12]
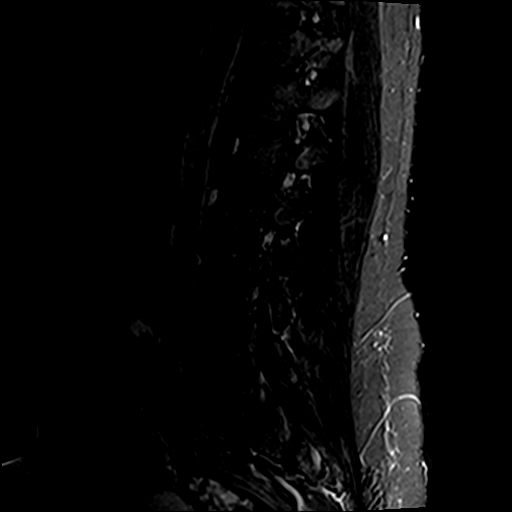
[im 12/12]
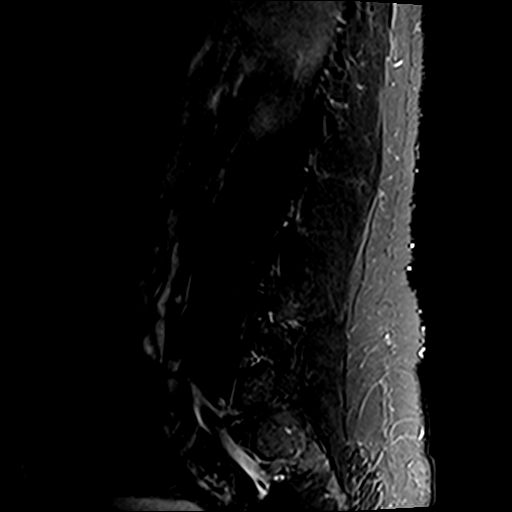

[Series 6: T1 · axial · 4.0mm · 0.78mm/px · z∈[-119,+88]mm · 13 of 34 slices shown (2 of 2)]
[im 1/34]
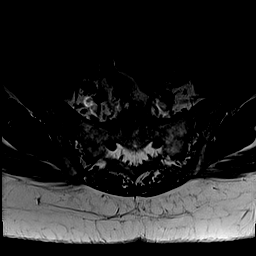
[im 3/34]
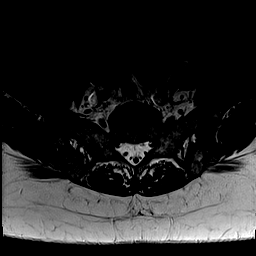
[im 5/34]
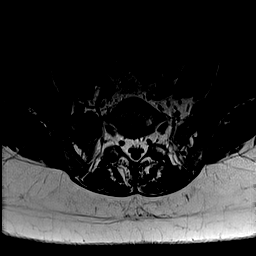
[im 7/34]
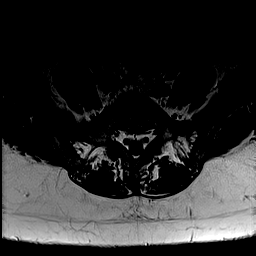
[im 9/34]
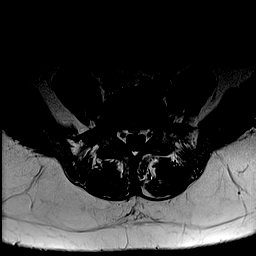
[im 12/34]
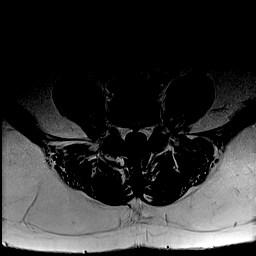
[im 14/34]
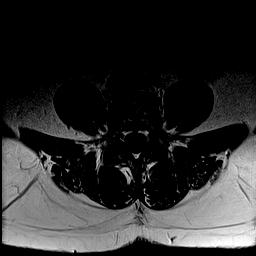
[im 16/34]
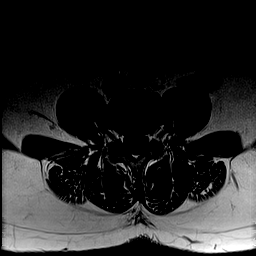
[im 18/34]
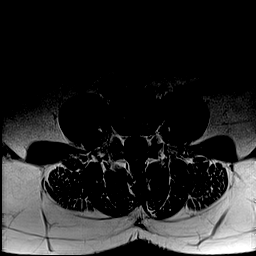
[im 20/34]
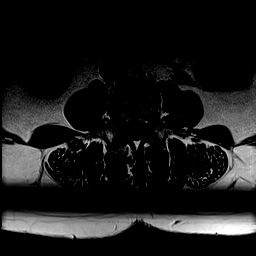
[im 25/34]
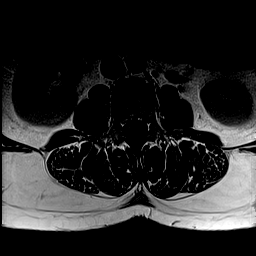
[im 29/34]
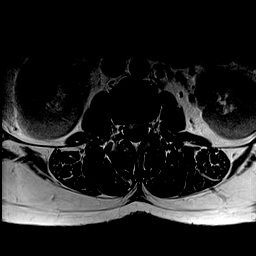
[im 34/34]
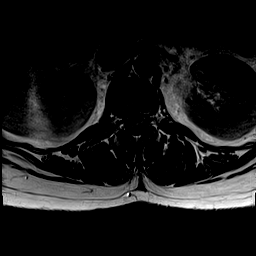

[Series 7: T2 · axial · 4.0mm · 0.78mm/px · z∈[-119,+88]mm · 16 of 34 slices shown]
[im 1/34]
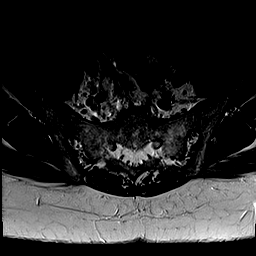
[im 3/34]
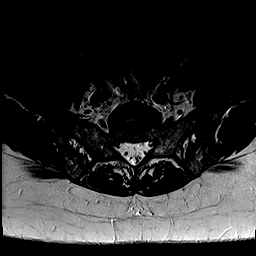
[im 5/34]
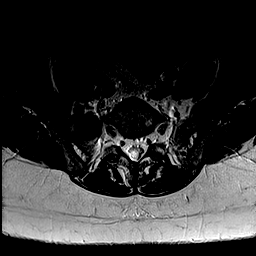
[im 7/34]
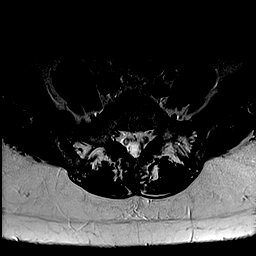
[im 9/34]
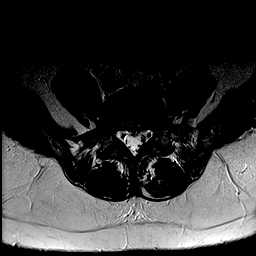
[im 12/34]
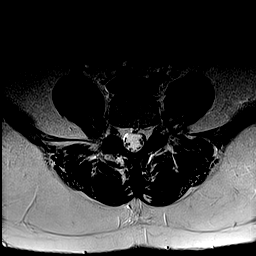
[im 14/34]
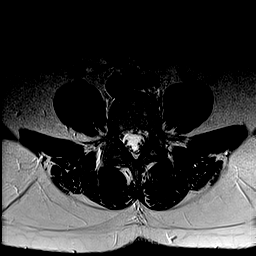
[im 16/34]
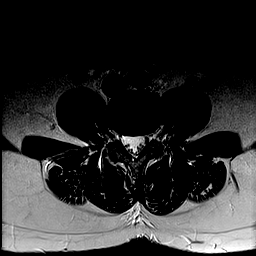
[im 18/34]
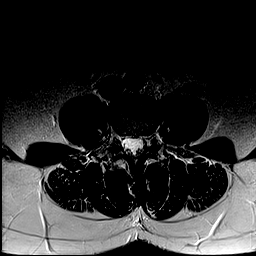
[im 20/34]
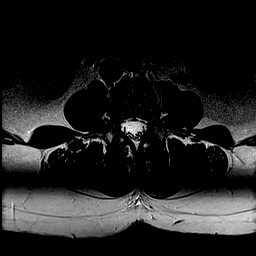
[im 23/34]
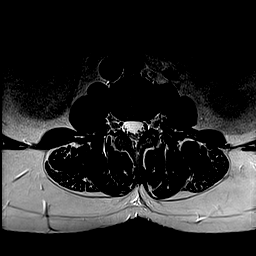
[im 25/34]
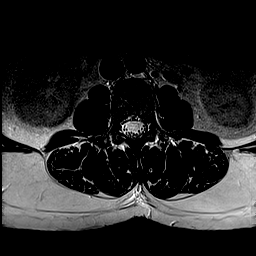
[im 27/34]
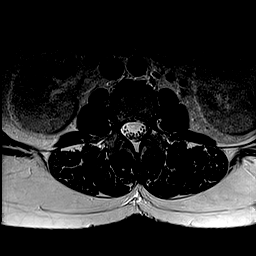
[im 29/34]
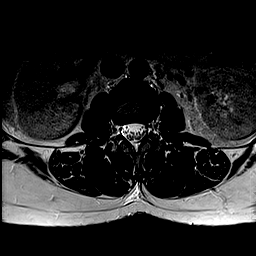
[im 31/34]
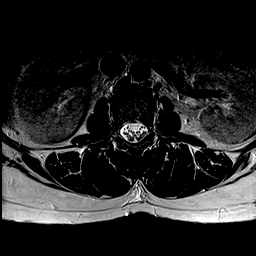
[im 34/34]
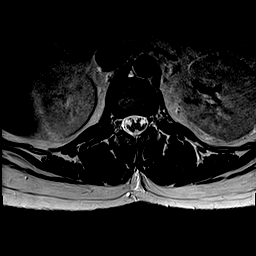

[45 of 48 positions shown; findings below may reference images not displayed]

FINDINGS: Segmentation: Transitional lumbosacral anatomy with lumbarization of
S1 and right lumbosacral pseudoarthrosis.

Alignment:  Normal.

Vertebrae:  No fracture, evidence of discitis, or bone lesion.

Conus medullaris and cauda equina: Conus extends to the L2 level.
Conus and cauda equina appear normal.

Paraspinal and other soft tissues: Incidental nabothian cysts.
Otherwise unremarkable.

Disc levels:

Multilevel desiccation with mild L4-5 and L5-S1 disc space loss.
Modic type 2 endplate degenerative changes at the L5-S1 level.

L1-2: No significant disc bulge, spinal canal or neural foraminal
narrowing.

L2-3: No significant disc bulge, spinal canal or neural foraminal
narrowing.

L3-4: Minimal disc bulge. No significant spinal canal or neural
foraminal narrowing.

L4-5: Mild disc bulge. No significant spinal canal or neural
foraminal narrowing.

L5-S1: Disc bulge with superimposed central protrusion, ligamentum
flavum and bilateral facet hypertrophy. Mild bilateral neural
foraminal narrowing. No significant spinal canal narrowing.

S1-2: No significant disc bulge, spinal canal or neural foraminal
narrowing.
IMPRESSION: S1 lumbarization with right lumbosacral pseudoarthrosis.

Mild bilateral L5-S1 neural foraminal narrowing.

No significant spinal canal narrowing.

## 2022-04-10 ENCOUNTER — Encounter: Payer: Self-pay | Admitting: Emergency Medicine

## 2022-04-10 ENCOUNTER — Ambulatory Visit
Admission: EM | Admit: 2022-04-10 | Discharge: 2022-04-10 | Disposition: A | Discharge status: Home / Self Care | Payer: 59 | Attending: Emergency Medicine | Admitting: Emergency Medicine

## 2022-04-10 ENCOUNTER — Ambulatory Visit: Payer: Self-pay

## 2022-04-10 DIAGNOSIS — R9431 Abnormal electrocardiogram [ECG] [EKG]: Secondary | ICD-10-CM | POA: Diagnosis not present

## 2022-04-10 DIAGNOSIS — I10 Essential (primary) hypertension: Secondary | ICD-10-CM | POA: Diagnosis not present

## 2022-04-10 MED ORDER — AMLODIPINE BESYLATE 5 MG PO TABS: 5.0000 mg | ORAL_TABLET | Freq: Every day | ORAL | 2 refills | Status: DC

## 2022-04-10 MED ORDER — HYDROCHLOROTHIAZIDE 25 MG PO TABS: 25.0000 mg | ORAL_TABLET | Freq: Every morning | ORAL | 2 refills | Status: DC

## 2022-04-10 NOTE — ED Provider Notes (Signed)
[facility]    CSN: [id number] Arrival date & time: 04/10/22  1640    HISTORY   Chief Complaint  Patient presents with   Hypertension   Headache   Nausea   diaphoresis   Blurred Vision   HPI Deanna Rodgers is a 56 y.o. female. Patient presents to urgent care today with chief complaint of headache that continues to worsen, blurred vision, intermittent episodes of feeling sweaty and nauseous.  Patient states she has been compliant with her blood pressure medications, lisinopril and hydrochlorothiazide however per EMR, this prescription has not been refilled since February 2022 at which time she was only provided with 180 doses of each.  When asked how she was still taking these medications after 1 year with only a 81-monthsupply, patient admits that she has only been taking them every other day because she felt like 40 mg of lisinopril was too much and that the hydrochlorothiazide really was not helping.  On arrival, patient has a blood pressure of 175/112.  Patient states she was not aware that her blood pressure was elevated.  The history is provided by the patient.   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   [place]   Synovitis of knee 12/23/2012   Hypertension 10/16/2012   Overdose 10/15/2012   Degenerative arthritis of knee 12/25/2011   HEMORRHOIDS-INTERNAL 10/14/2010   HEMORRHOIDS-EXTERNAL 10/14/2010   GERD 10/14/2010    Duodenal ulcer without hemorrhage or perforation and without obstruction 10/14/2010   Irritable bowel syndrome 10/14/2010   [place] 10[place]   IRON DEFICIENCY 08/08/2009   DYSPEPSIA&OTHER SPEC DISORDERS FUNCTION STOMACH 08/08/2009   RECTAL BLEEDING 08/08/2009   Past Surgical History:  Procedure Laterality Date   CESAREAN SECTION  12-24-11   '85- x1   COLONOSCOPY     ENDOMETRIAL ABLATION     hemorroidectomy  yrs ago   JOINT REPLACEMENT  12/2011   rt total knee   KNEE ARTHROPLASTY  12/25/2011   Procedure: COMPUTER ASSISTED TOTAL KNEE ARTHROPLASTY;  Surgeon: CMcarthur Rossetti MD;  Location: [facility];  Service: Orthopedics;  Laterality: Right;   KNEE ARTHROSCOPY  12-24-11   x2 -last 12'12([facility])   KNEE ARTHROSCOPY Right 12/23/2012   Procedure: Right Knee Arthroscopy with minimal Debridement ;  Surgeon: CMcarthur Rossetti MD;  Location: [facility];  Service: Orthopedics;  Laterality: Right;  Right Knee Arthroscopy with minimal  Debridement    TOTAL KNEE REVISION Right 04/14/2013   Procedure: Synovectomy right total knee with poly exchange;  Surgeon: CMcarthur Rossetti MD;  Location: [facility];  Service: Orthopedics;  Laterality: Right;   TOTAL KNEE REVISION Right 08/11/2013   Procedure: REVISION RIGHT TOTAL KNEE ARTHROPLASTY;  Surgeon: CMcarthur Rossetti MD;  Location: [facility];  Service: Orthopedics;  Laterality: Right;   TUBAL LIGATION  UPPER GASTROINTESTINAL ENDOSCOPY     OB History     Gravida  5   Para  5   Term  3   Preterm  2   AB  0   Living  5      SAB  0   IAB  0   Ectopic  0   Multiple  0   Live Births             Home Medications    Prior to Admission medications   Medication Sig Start Date End Date Taking? Authorizing Provider  traMADol (ULTRAM) 50 MG tablet TAKE 1 TABLET (50 MG TOTAL) BY MOUTH EVERY 12 (TWELVE) HOURS AS NEEDED FOR SEVERE PAIN. 03/25/21   Isaac Bliss, Rayford Halsted, MD   diclofenac Sodium (VOLTAREN) 1 % GEL APPLY 2 GRAMS TO AFFECTED AREA 4 TIMES A DAY 04/29/21   Isaac Bliss, Rayford Halsted, MD  esomeprazole (NEXIUM) 20 MG capsule Take 1 capsule (20 mg total) by mouth daily at 12 noon. 06/11/20   Isaac Bliss, Rayford Halsted, MD  hydrochlorothiazide (HYDRODIURIL) 25 MG tablet Take 1 tablet (25 mg total) by mouth daily. 11/13/20   Isaac Bliss, Rayford Halsted, MD  linaclotide [facility]) 72 MCG capsule Take 1 capsule (72 mcg total) by mouth daily before breakfast. 06/11/20   Isaac Bliss, Rayford Halsted, MD  lisinopril (ZESTRIL) 40 MG tablet Take 1 tablet (40 mg total) by mouth daily. 12/25/20   Isaac Bliss, Rayford Halsted, MD  pregabalin (LYRICA) 50 MG capsule Take 50 mg by mouth 3 (three) times daily.     [provider]  Prucalopride Succinate (MOTEGRITY) 2 MG TABS Take 1 tablet (2 mg total) by mouth daily. 10/14/20   Doran Stabler, MD    Family History Family History  Problem Relation Age of Onset   Hypertension Maternal Grandmother    Stroke Maternal Grandmother    Hypertension Father    Stroke Father    Hypertension Maternal Aunt    Cancer Maternal Uncle    Cancer Paternal Uncle        Colon   Colon cancer Neg Hx    Esophageal cancer Neg Hx    Rectal cancer Neg Hx    Stomach cancer Neg Hx    Social History Social History   Tobacco Use   Smoking status: Never   Smokeless tobacco: Never  Vaping Use   Vaping Use: Never used  Substance Use Topics   Alcohol use: No   Drug use: No   Allergies   Patient has no known allergies.  Review of Systems Review of Systems Pertinent findings noted in history of present illness.   Physical Exam Triage Vital Signs ED Triage Vitals  Enc Vitals Group     BP 08/29/21 0827 (!) 147/82     Pulse Rate 08/29/21 0827 72     Resp 08/29/21 0827 18     Temp 08/29/21 0827 98.3 F (36.8 C)     Temp Source 08/29/21 0827 Oral     SpO2 08/29/21 0827 98 %     Weight --      Height --      Head Circumference  --      Peak Flow --      Pain Score 08/29/21 0826 5     Pain Loc --      Pain Edu? --      Excl. in [place]? --   No data found.  Updated Vital Signs BP Marland Kitchen)  175/112 Comment: Taken twice in different loactions. This was better reading.  Pulse 96   Temp 98.8 F (37.1 C)   Resp 20   SpO2 98%   Physical Exam Vitals and nursing note reviewed.  Constitutional:      General: She is not in acute distress.    Appearance: Normal appearance. She is not ill-appearing.  HENT:     Head: Normocephalic and atraumatic.     Salivary Glands: Right salivary gland is not diffusely enlarged or tender. Left salivary gland is not diffusely enlarged or tender.     Right Ear: Tympanic membrane, ear canal and external ear normal. No drainage. No middle ear effusion. There is no impacted cerumen. Tympanic membrane is not erythematous or bulging.     Left Ear: Tympanic membrane, ear canal and external ear normal. No drainage.  No middle ear effusion. There is no impacted cerumen. Tympanic membrane is not erythematous or bulging.     Nose: Nose normal. No nasal deformity, septal deviation, mucosal edema, congestion or rhinorrhea.     Right Turbinates: Not enlarged, swollen or pale.     Left Turbinates: Not enlarged, swollen or pale.     Right Sinus: No maxillary sinus tenderness or frontal sinus tenderness.     Left Sinus: No maxillary sinus tenderness or frontal sinus tenderness.     Mouth/Throat:     Lips: Pink. No lesions.     Mouth: Mucous membranes are moist. No oral lesions.     Pharynx: Oropharynx is clear. Uvula midline. No posterior oropharyngeal erythema or uvula swelling.     Tonsils: No tonsillar exudate. 0 on the right. 0 on the left.  Eyes:     General: Lids are normal.        Right eye: No discharge.        Left eye: No discharge.     Extraocular Movements: Extraocular movements intact.     Conjunctiva/sclera: Conjunctivae normal.     Right eye: Right conjunctiva is not injected.     Left  eye: Left conjunctiva is not injected.  Neck:     Trachea: Trachea and phonation normal.  Cardiovascular:     Rate and Rhythm: Normal rate and regular rhythm.     Pulses: Normal pulses.     Heart sounds: Normal heart sounds. No murmur heard.    No friction rub. No gallop.  Pulmonary:     Effort: Pulmonary effort is normal. No accessory muscle usage, prolonged expiration or respiratory distress.     Breath sounds: Normal breath sounds. No stridor, decreased air movement or transmitted upper airway sounds. No decreased breath sounds, wheezing, rhonchi or rales.  Chest:     Chest wall: No tenderness.  Musculoskeletal:        General: Normal range of motion.     Cervical back: Normal range of motion and neck supple. Normal range of motion.  Lymphadenopathy:     Cervical: No cervical adenopathy.  Skin:    General: Skin is warm and dry.     Findings: No erythema or rash.  Neurological:     General: No focal deficit present.     Mental Status: She is alert and oriented to person, place, and time.  Psychiatric:        Mood and Affect: Mood normal.        Behavior: Behavior normal.     Visual Acuity Right Eye Distance:   Left Eye Distance:   Bilateral Distance:    Right  Eye Near:   Left Eye Near:    Bilateral Near:     UC Couse / Diagnostics / Procedures:    EKG  Radiology No results found.  Procedures Procedures (including critical care time)  UC Diagnoses / Final Clinical Impressions(s)   I have reviewed the triage vital signs and the nursing notes.  Pertinent labs & imaging results that were available during my care of the patient were reviewed by me and considered in my medical decision making (see chart for details).    Final diagnoses:  Nonspecific abnormal electrocardiogram (ECG) (EKG)  Essential hypertension   Patient provided with information about hypertension and advised not to skip doses of her medications.  Patient provided with prescription for  amlodipine, calcium channel blocker being preferred agent for African-American patients.  Patient also provided with a renewal of hydrochlorothiazide and advised her to take it daily to reduce her diastolic pressure.  Patient states she has an appointment coming up with her new primary care provider next month.  EKG obtained today with subtle findings including right atrial enlargement and prolonged QT.  Patient advised of abnormal findings.  Patient also advised that while I do not suspect that she is having an acute ischemic event at this time, if her headache and nausea persist despite beginning blood pressure medication and getting her blood pressure under control, she should go to emergency immediately for cardiac work-up.  Patient verbalized understanding with this plan.  ED Prescriptions     Medication Sig Dispense Auth. Provider   amLODipine (NORVASC) 5 MG tablet Take 1 tablet (5 mg total) by mouth daily. 30 tablet Lynden Oxford Scales, PA-C   hydrochlorothiazide (HYDRODIURIL) 25 MG tablet Take 1 tablet (25 mg total) by mouth in the morning. 30 tablet Lynden Oxford Scales, PA-C      PDMP not reviewed this encounter.  Pending results:  Labs Reviewed - No data to display  Medications Ordered in UC: Medications - No data to display  Disposition Upon Discharge:  Condition: stable for discharge home Home: take medications as prescribed; routine discharge instructions as discussed; follow up as advised.  Patient presented with an acute illness with associated systemic symptoms and significant discomfort requiring urgent management. In my opinion, this is a condition that a prudent lay person (someone who possesses an average knowledge of health and medicine) may potentially expect to result in complications if not addressed urgently such as respiratory distress, impairment of bodily function or dysfunction of bodily organs.   Routine symptom specific, illness specific and/or disease  specific instructions were discussed with the patient and/or caregiver at length.   As such, the patient has been evaluated and assessed, work-up was performed and treatment was provided in alignment with urgent care protocols and evidence based medicine.  Patient/parent/caregiver has been advised that the patient may require follow up for further testing and treatment if the symptoms continue in spite of treatment, as clinically indicated and appropriate.  Patient/parent/caregiver has been advised to return to the [facility] or PCP if no better; to PCP or the Emergency Department if new signs and symptoms develop, or if the current signs or symptoms continue to change or worsen for further workup, evaluation and treatment as clinically indicated and appropriate  The patient will follow up with their current PCP if and as advised. If the patient does not currently have a PCP we will assist them in obtaining one.   The patient may need specialty follow up if the symptoms continue, in spite  of conservative treatment and management, for further workup, evaluation, consultation and treatment as clinically indicated and appropriate.   Patient/parent/caregiver verbalized understanding and agreement of plan as discussed.  All questions were addressed during visit.  Please see discharge instructions below for further details of plan.  Discharge Instructions:   Discharge Instructions      For your blood pressure, please begin amlodipine 5 mg and hydrochlorothiazide 25 mg, 1 tablet of each every morning.  Please do not skip doses.  The reason to call hypertension the silent killer is because by the time you actually have symptoms, these usually lead to heart attack and stroke.  Please follow-up with your primary care provider regarding your blood pressure.  If you do not have relief of your headache and nausea despite improved blood pressure, please go to the emergency room for further evaluation.  Thank you  for visiting urgent care today.      This office note has been dictated using Museum/gallery curator.  Unfortunately, and despite my best efforts, this method of dictation can sometimes lead to occasional typographical or grammatical errors.  I apologize in advance if this occurs.     Lynden Oxford Scales, PA-C 04/10/22 1935

## 2022-04-10 NOTE — ED Triage Notes (Signed)
Pt here with symptomatic HTN x 3 days. Experiencing pounding headache that continues to worsen, blurred vision, intermittent diaphoresis, and nausea. Did not know her BP was high until taken here. Pt takes BP meds as prescribed.

## 2022-04-10 NOTE — Discharge Instructions (Signed)
For your blood pressure, please begin amlodipine 5 mg and hydrochlorothiazide 25 mg, 1 tablet of each every morning.  Please do not skip doses.  The reason to call hypertension the silent killer is because by the time you actually have symptoms, these usually lead to heart attack and stroke.  Please follow-up with your primary care provider regarding your blood pressure.  If you do not have relief of your headache and nausea despite improved blood pressure, please go to the emergency room for further evaluation.  Thank you for visiting urgent care today.

## 2022-07-02 ENCOUNTER — Other Ambulatory Visit: Payer: Self-pay

## 2022-07-02 ENCOUNTER — Emergency Department
Admission: EM | Admit: 2022-07-02 | Discharge: 2022-07-02 | Disposition: A | Discharge status: Home / Self Care | Payer: 59 | Attending: Emergency Medicine | Admitting: Emergency Medicine

## 2022-07-02 ENCOUNTER — Emergency Department: Payer: 59

## 2022-07-02 DIAGNOSIS — I10 Essential (primary) hypertension: Secondary | ICD-10-CM | POA: Insufficient documentation

## 2022-07-02 DIAGNOSIS — R0789 Other chest pain: Secondary | ICD-10-CM | POA: Diagnosis present

## 2022-07-02 DIAGNOSIS — R079 Chest pain, unspecified: Secondary | ICD-10-CM

## 2022-07-02 LAB — CBC
HCT: 41.2 % (ref 36.0–46.0)
Hemoglobin: 12.5 g/dL (ref 12.0–15.0)
MCH: 20.7 pg — ABNORMAL LOW (ref 26.0–34.0)
MCHC: 30.3 g/dL (ref 30.0–36.0)
MCV: 68.3 fL — ABNORMAL LOW (ref 80.0–100.0)
Platelets: 181 10*3/uL (ref 150–400)
RBC: 6.03 MIL/uL — ABNORMAL HIGH (ref 3.87–5.11)
RDW: 15.3 % (ref 11.5–15.5)
WBC: 4.8 10*3/uL (ref 4.0–10.5)
nRBC: 0 % (ref 0.0–0.2)

## 2022-07-02 LAB — HCG, QUANTITATIVE, PREGNANCY: hCG, Beta Chain, Quant, S: <1 m[IU]/mL (ref <5)

## 2022-07-02 LAB — BASIC METABOLIC PANEL WITH GFR
Anion gap: 10 (ref 5–15)
BUN: 17 mg/dL (ref 6–20)
CO2: 29 mmol/L (ref 22–32)
Calcium: 9 mg/dL (ref 8.9–10.3)
Chloride: 101 mmol/L (ref 98–111)
Creatinine, Ser: 0.64 mg/dL (ref 0.44–1.00)
GFR, Estimated: >60 mL/min
Glucose, Bld: 94 mg/dL (ref 70–99)
Potassium: 2.9 mmol/L — ABNORMAL LOW (ref 3.5–5.1)
Sodium: 140 mmol/L (ref 135–145)

## 2022-07-02 LAB — D-DIMER, QUANTITATIVE: D-Dimer, Quant: 0.43 ug{FEU}/mL (ref 0.00–0.50)

## 2022-07-02 LAB — TROPONIN I (HIGH SENSITIVITY)
Troponin I (High Sensitivity): <2 ng/L (ref <18)
Troponin I (High Sensitivity): <2 ng/L (ref <18)

## 2022-07-02 MED ORDER — POTASSIUM CHLORIDE CRYS ER 20 MEQ PO TBCR
40.0000 meq | EXTENDED_RELEASE_TABLET | Freq: Once | ORAL | Status: AC
  Administered 2022-07-02: 40 meq via ORAL
  Filled 2022-07-02: qty 2

## 2022-07-02 MED ORDER — ALUM & MAG HYDROXIDE-SIMETH 200-200-20 MG/5ML PO SUSP
30.0000 mL | Freq: Once | ORAL | Status: AC
  Administered 2022-07-02: 30 mL via ORAL
  Filled 2022-07-02: qty 30

## 2022-07-02 NOTE — ED Provider Notes (Signed)
[facility] Provider Note    Event Date/Time   First MD Initiated Contact with Patient 07/02/22 1648     (approximate)   History   Chest Pain   HPI  Deanna Rodgers is a 56 y.o. female with past medical history of hypertension, GERD, depression, anxiety, anemia presents with chest pain.  Patient's pain started around 9 AM today.  She describes it as a pressure-like feeling that has been constant is nonradiating and not associated with shortness of breath nausea or diaphoresis.  Feels worse when she takes a deep breath and when she lies back actually feels improved when she walks around.  Denies history of similar.The patient denies hx of prior DVT/PE, unilateral leg pain/swelling, hormone use, recent surgery, hx of cancer, prolonged immobilization, or hemoptysis.   Patient had a regadenoson nuclear stress test yesterday.  Said she had chest tightness during the test as expected but this resolved.  I can see the stress test results and it was negative.    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   Painful total knee replacement ([place]) 04/14/2013   Synovitis of knee 12/23/2012   Hypertension 10/16/2012   Overdose 10/15/2012   Degenerative arthritis of knee 12/25/2011   HEMORRHOIDS-INTERNAL 10/14/2010   HEMORRHOIDS-EXTERNAL 10/14/2010   GERD 10/14/2010   Duodenal ulcer without hemorrhage or perforation and without obstruction 10/14/2010   Irritable  bowel syndrome 10/14/2010   Acute duodenal ulcer with perforation ([place]) 10/28/2009   IRON DEFICIENCY 08/08/2009   DYSPEPSIA&OTHER SPEC DISORDERS FUNCTION STOMACH 08/08/2009   RECTAL BLEEDING 08/08/2009     Physical Exam  Triage Vital Signs: ED Triage Vitals  Enc Vitals Group     BP 07/02/22 1546 (!) 144/95     Pulse Rate 07/02/22 1546 100     Resp 07/02/22 1546 18     Temp 07/02/22 1546 98.2 F (36.8 C)     Temp Source 07/02/22 1546 Oral     SpO2 07/02/22 1546 99 %     Weight 07/02/22 1544 160 lb (72.6 kg)     Height 07/02/22 1544 '5\' 3"'$  (1.6 m)     Head Circumference --      Peak Flow --      Pain Score 07/02/22 1544 9     Pain Loc --      Pain Edu? --      Excl. in [place]? --     Most recent vital signs: Vitals:   07/02/22 1546  BP: (!) 144/95  Pulse: 100  Resp: 18  Temp: 98.2 F (36.8 C)  SpO2: 99%     General: Awake, no distress.  CV:  Good peripheral perfusion.  No lower extremity edema or asymmetry Resp:  Normal effort.  Lungs are clear Abd:  No distention.  Neuro:  Awake, Alert, Oriented x 3  Other:     ED Results / Procedures / Treatments  Labs (all labs ordered are listed, but only abnormal results are displayed) Labs Reviewed  BASIC METABOLIC PANEL - Abnormal; Notable for the following components:      Result Value   Potassium 2.9 (*)    All other components within normal limits  CBC - Abnormal; Notable for the following components:   RBC 6.03 (*)    MCV 68.3 (*)    MCH 20.7 (*)    All other components within normal limits  D-DIMER, QUANTITATIVE  HCG, QUANTITATIVE, PREGNANCY  TROPONIN I (HIGH SENSITIVITY)  TROPONIN I (HIGH SENSITIVITY)     EKG  EKG interpreted myself shows sinus tachycardia with normal axis normal intervals no acute ischemic changes   RADIOLOGY I reviewed and interpreted the CXR which does not show any acute cardiopulmonary process    PROCEDURES:  Critical Care performed:  No  Procedures   MEDICATIONS ORDERED IN ED: Medications  potassium chloride SA (KLOR-CON M) CR tablet 40 mEq (40 mEq Oral Given 07/02/22 1803)  alum & mag hydroxide-simeth (MAALOX/MYLANTA) 200-200-20 MG/5ML suspension 30 mL (30 mLs Oral Given 07/02/22 1803)     IMPRESSION / MDM / ASSESSMENT AND PLAN / ED COURSE  I reviewed the triage vital signs and the nursing notes.                              Patient's presentation is most consistent with acute presentation with potential threat to life or bodily function.  Differential diagnosis includes, but is not limited to, acute coronary syndrome, pleurisy, pulmonary embolism, GERD, esophagitis, musculoskeletal pain  The patient is a 56 year old female hypertension and heart failure preserved ejection fraction who presents with chest pain.  The started on 9 AM.  As pressure-like constant nonradiating nonexertional feels worse with deep breath and lying backward.  Interestingly patient had a regadenoson stress test yesterday which was negative.  She tells me she had the stress test not because of chest pain or anginal symptoms because she was diagnosed with heart failure with preserved ejection fraction they wanted to make sure she did not have a blockage.  Patient's vitals are reassuring.  Heart rate is mildly elevated just above 100.  She looks overall well with clear lungs and no signs of DVT on exam.  Her EKG is nonischemic and high-sensitivity troponin greater than 3 hours from onset of symptoms is undetectable less than 2.  Chest pain is atypical and with negative troponin and no ischemic changes on EKG especially with his negative stress test just yesterday I feel that ACS is less likely.  However with her tachycardia and age she does not PERC out and do feel that we need to rule out pulmonary embolism so will obtain D-dimer.  Will try GI cocktail in case this pain is at all GI related.     D-dimer is negative as is repeat troponin.  Given the  reassuring work-up here will discharge with cardiology follow-up.  FINAL CLINICAL IMPRESSION(S) / ED DIAGNOSES   Final diagnoses:  Nonspecific chest pain     Rx / DC Orders   ED Discharge Orders     None        Note:  This document was prepared using Dragon voice recognition software and may include unintentional dictation errors.   Rada Hay, MD 07/02/22 [phone]

## 2022-07-02 NOTE — Discharge Instructions (Addendum)
Your blood work, clean your cardiac enzymes to screen for heart attacks and your D-dimer which screens for blood clot in your lungs were all reassuring.  We are unclear exactly what is causing your pain but feel that is unlikely to be heart attack.  Please follow-up with your cardiologist if you continue to have pain.  If your pain is changing in someway or you develop new symptoms such as shortness of breath sweating or vomiting please return the emergency department.

## 2022-07-02 NOTE — ED Triage Notes (Signed)
Pt states she started with CP this AM- pt states pain is in the center of her chest- pt denies back pain and jaw pain- pt does endorse SHOB, denies nausea- pt states the pain feels more like a pressure- denies any recent injuries

## 2022-07-25 NOTE — Progress Notes (Signed)
I think this was ordered from triage. If patient were pregnant this would make pulmonary embolism of greater concern.

## 2023-03-22 ENCOUNTER — Telehealth: Payer: 59 | Admitting: Physician Assistant

## 2023-03-22 NOTE — Progress Notes (Signed)
The patient no-showed for appointment despite this provider sending direct link, reaching out via phone with no response and waiting for at least 10 minutes from appointment time for patient to join. They will be marked as a NS for this appointment/time.   Kalianne Fetting M Richad Ramsay, PA-C    

## 2023-04-08 ENCOUNTER — Encounter: Payer: 59 | Admitting: Internal Medicine

## 2023-04-08 ENCOUNTER — Other Ambulatory Visit: Payer: Self-pay | Admitting: Internal Medicine

## 2023-04-08 DIAGNOSIS — M659 Synovitis and tenosynovitis, unspecified: Secondary | ICD-10-CM

## 2023-04-08 DIAGNOSIS — M65969 Unspecified synovitis and tenosynovitis, unspecified lower leg: Secondary | ICD-10-CM

## 2023-04-08 DIAGNOSIS — D509 Iron deficiency anemia, unspecified: Secondary | ICD-10-CM

## 2023-04-08 DIAGNOSIS — Z96659 Presence of unspecified artificial knee joint: Secondary | ICD-10-CM

## 2023-04-08 DIAGNOSIS — I10 Essential (primary) hypertension: Secondary | ICD-10-CM

## 2023-04-08 DIAGNOSIS — T84018A Broken internal joint prosthesis, other site, initial encounter: Secondary | ICD-10-CM

## 2023-04-08 DIAGNOSIS — K269 Duodenal ulcer, unspecified as acute or chronic, without hemorrhage or perforation: Secondary | ICD-10-CM

## 2023-04-08 DIAGNOSIS — T8484XD Pain due to internal orthopedic prosthetic devices, implants and grafts, subsequent encounter: Secondary | ICD-10-CM

## 2023-04-08 MED ORDER — AMLODIPINE BESYLATE 5 MG PO TABS: 5.0000 mg | ORAL_TABLET | Freq: Every day | ORAL | 0 refills | Status: DC

## 2023-04-08 MED ORDER — DICLOFENAC SODIUM 1 % EX GEL: CUTANEOUS | 0 refills | Status: DC

## 2023-04-08 MED ORDER — TRAMADOL HCL 50 MG PO TABS: 50.0000 mg | ORAL_TABLET | Freq: Two times a day (BID) | ORAL | 0 refills | Status: DC | PRN

## 2023-04-08 MED ORDER — ESOMEPRAZOLE MAGNESIUM 20 MG PO CPDR: 20.0000 mg | DELAYED_RELEASE_CAPSULE | Freq: Every day | ORAL | 0 refills | Status: DC

## 2023-04-08 MED ORDER — HYDROCHLOROTHIAZIDE 25 MG PO TABS
25.0000 mg | ORAL_TABLET | Freq: Every morning | ORAL | 0 refills | Status: DC
Start: 2023-04-08 — End: 2023-05-04

## 2023-04-08 NOTE — Telephone Encounter (Signed)
Patient needs a refill on Lisinopril.  Patient states that she has been refills from the ER at [facility] until she could get in for a refill.  She had to r/s for 05/18/23 for her physical.  Patient states she also needs a refill on her hot flashes medication that she has been getting from the ER as well.  Please give patient a call.

## 2023-04-08 NOTE — Telephone Encounter (Signed)
Patient made a virtual appointment for 04/12/23 for refills.

## 2023-04-08 NOTE — Telephone Encounter (Signed)
Spoke with patient and found out which medications she needs refills of.

## 2023-04-12 ENCOUNTER — Telehealth: Payer: 59 | Admitting: Internal Medicine

## 2023-04-30 ENCOUNTER — Other Ambulatory Visit: Payer: Self-pay | Admitting: Internal Medicine

## 2023-04-30 DIAGNOSIS — K269 Duodenal ulcer, unspecified as acute or chronic, without hemorrhage or perforation: Secondary | ICD-10-CM

## 2023-05-18 ENCOUNTER — Encounter: Payer: Self-pay | Admitting: Internal Medicine

## 2023-05-18 ENCOUNTER — Ambulatory Visit (INDEPENDENT_AMBULATORY_CARE_PROVIDER_SITE_OTHER): Payer: 59 | Admitting: Internal Medicine

## 2023-05-18 ENCOUNTER — Other Ambulatory Visit: Payer: 59

## 2023-05-18 VITALS — BP 165/105 | HR 88 | Temp 98.4°F | Ht 64.5 in | Wt 173.0 lb

## 2023-05-18 DIAGNOSIS — Z1159 Encounter for screening for other viral diseases: Secondary | ICD-10-CM | POA: Diagnosis not present

## 2023-05-18 DIAGNOSIS — T8484XD Pain due to internal orthopedic prosthetic devices, implants and grafts, subsequent encounter: Secondary | ICD-10-CM | POA: Diagnosis not present

## 2023-05-18 DIAGNOSIS — T84018A Broken internal joint prosthesis, other site, initial encounter: Secondary | ICD-10-CM | POA: Diagnosis not present

## 2023-05-18 DIAGNOSIS — Z96652 Presence of left artificial knee joint: Secondary | ICD-10-CM

## 2023-05-18 DIAGNOSIS — R7309 Other abnormal glucose: Secondary | ICD-10-CM

## 2023-05-18 DIAGNOSIS — Z96659 Presence of unspecified artificial knee joint: Secondary | ICD-10-CM

## 2023-05-18 DIAGNOSIS — M1711 Unilateral primary osteoarthritis, right knee: Secondary | ICD-10-CM

## 2023-05-18 DIAGNOSIS — Z114 Encounter for screening for human immunodeficiency virus [HIV]: Secondary | ICD-10-CM

## 2023-05-18 DIAGNOSIS — Z0000 Encounter for general adult medical examination without abnormal findings: Secondary | ICD-10-CM

## 2023-05-18 DIAGNOSIS — I10 Essential (primary) hypertension: Secondary | ICD-10-CM | POA: Diagnosis not present

## 2023-05-18 LAB — CBC WITH DIFFERENTIAL/PLATELET
Basophils Absolute: 0 10*3/uL (ref 0.0–0.1)
Basophils Relative: 0.5 % (ref 0.0–3.0)
Eosinophils Absolute: 0 10*3/uL (ref 0.0–0.7)
Eosinophils Relative: 0.9 % (ref 0.0–5.0)
HCT: 42.6 % (ref 36.0–46.0)
Hemoglobin: 12.9 g/dL (ref 12.0–15.0)
Lymphocytes Relative: 45.3 % (ref 12.0–46.0)
Lymphs Abs: 1.8 10*3/uL (ref 0.7–4.0)
MCHC: 30.3 g/dL (ref 30.0–36.0)
MCV: 66.6 fl — ABNORMAL LOW (ref 78.0–100.0)
Monocytes Absolute: 0.3 10*3/uL (ref 0.1–1.0)
Monocytes Relative: 6.8 % (ref 3.0–12.0)
Neutro Abs: 1.9 10*3/uL (ref 1.4–7.7)
Neutrophils Relative %: 46.5 % (ref 43.0–77.0)
Platelets: 216 10*3/uL (ref 150.0–400.0)
RBC: 6.39 Mil/uL — ABNORMAL HIGH (ref 3.87–5.11)
RDW: 16.4 % — ABNORMAL HIGH (ref 11.5–15.5)
WBC: 4 10*3/uL (ref 4.0–10.5)

## 2023-05-18 LAB — COMPREHENSIVE METABOLIC PANEL
ALT: 11 U/L (ref 0–35)
AST: 11 U/L (ref 0–37)
Albumin: 4.4 g/dL (ref 3.5–5.2)
Alkaline Phosphatase: 102 U/L (ref 39–117)
BUN: 11 mg/dL (ref 6–23)
CO2: 28 mEq/L (ref 19–32)
Calcium: 9.5 mg/dL (ref 8.4–10.5)
Chloride: 105 mEq/L (ref 96–112)
Creatinine, Ser: 0.67 mg/dL (ref 0.40–1.20)
GFR: 98.39 mL/min (ref >60.00)
Glucose, Bld: 83 mg/dL (ref 70–99)
Potassium: 3.5 mEq/L (ref 3.5–5.1)
Sodium: 140 mEq/L (ref 135–145)
Total Bilirubin: 0.6 mg/dL (ref 0.2–1.2)
Total Protein: 7.6 g/dL (ref 6.0–8.3)

## 2023-05-18 LAB — LIPID PANEL
Cholesterol: 145 mg/dL (ref 0–200)
HDL: 49.1 mg/dL (ref >39.00)
LDL Cholesterol: 73 mg/dL (ref 0–99)
NonHDL: 95.85
Total CHOL/HDL Ratio: 3
Triglycerides: 113 mg/dL (ref 0.0–149.0)
VLDL: 22.6 mg/dL (ref 0.0–40.0)

## 2023-05-18 MED ORDER — TRAMADOL HCL 50 MG PO TABS: 50.0000 mg | ORAL_TABLET | Freq: Two times a day (BID) | ORAL | 0 refills | Status: DC | PRN

## 2023-05-18 MED ORDER — VALSARTAN-HYDROCHLOROTHIAZIDE 160-25 MG PO TABS: 1.0000 | ORAL_TABLET | Freq: Every day | ORAL | 1 refills | Status: DC

## 2023-05-18 NOTE — Patient Instructions (Signed)
-  Nice seeing you today!!  -Lab work today; will notify you once results are available.  -Stop hydrochlorothiazide.  -Start valsartan HCT 1 tablet daily in addition to amlodipine 5 mg daily for blood pressure.  -Schedule follow up in 6-8 weeks.

## 2023-05-18 NOTE — Progress Notes (Signed)
Established Patient Office Visit     CC/Reason for Visit: Annual preventive exam and follow-up chronic medical conditions  HPI: Deanna Rodgers is a 57 y.o. female who is coming in today for the above mentioned reasons. Past Medical History is significant for: Hypertension, peptic ulcer disease and known history of medication noncompliance.  She states she is taking her amlodipine and hydrochlorothiazide however pharmacy fills do not agree with this.  She has routine eye and dental care.  Is overdue for shingles vaccine as well as a mammogram.   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 -SEVERAL RT KNEE PROCEDURES SINCE BECAUSE OF PAIN AND SWELLING IN THE KNEE   Blood transfusion without reported diagnosis    Depression    GERD (gastroesophageal reflux disease)    History of duodenal ulcer    History of gastroesophageal reflux (GERD)    Hypertension    Pain    LOWER BACK     Past Surgical History:  Procedure Laterality Date   CESAREAN SECTION  12-24-11   '85- x1   COLONOSCOPY     ENDOMETRIAL ABLATION     hemorroidectomy  yrs ago   JOINT REPLACEMENT  12/2011   rt total knee   KNEE ARTHROPLASTY  12/25/2011   Procedure: COMPUTER ASSISTED TOTAL KNEE ARTHROPLASTY;  Surgeon: [name], MD;  Location: [facility];  Service: Orthopedics;  Laterality: Right;   KNEE ARTHROSCOPY  12-24-11   x2 -last 12'12([facility])   KNEE ARTHROSCOPY Right 12/23/2012   Procedure: Right Knee Arthroscopy with minimal Debridement ;  Surgeon: [name], MD;  Location: [facility];  Service: Orthopedics;  Laterality: Right;  Right Knee Arthroscopy with minimal  Debridement    TOTAL KNEE REVISION Right 04/14/2013   Procedure: Synovectomy right total knee with poly exchange;  Surgeon: [name], MD;  Location: [facility];  Service:  Orthopedics;  Laterality: Right;   TOTAL KNEE REVISION Right 08/11/2013   Procedure: REVISION RIGHT TOTAL KNEE ARTHROPLASTY;  Surgeon: [name], MD;  Location: [facility];  Service: Orthopedics;  Laterality: Right;   TUBAL LIGATION     UPPER GASTROINTESTINAL ENDOSCOPY      Social History:  reports that she has never smoked. She has never used smokeless tobacco. She reports that she does not drink alcohol and does not use drugs.  Allergies: No Known Allergies  Family History:  Family History  Problem Relation Age of Onset   Hypertension Maternal Grandmother    Stroke Maternal Grandmother    Hypertension Father    Stroke Father    Hypertension Maternal Aunt    Cancer Maternal Uncle    Cancer Paternal Uncle        Colon   Colon cancer Neg Hx    Esophageal cancer Neg Hx    Rectal cancer Neg Hx    Stomach cancer Neg Hx      Current Outpatient Medications:    amLODipine (NORVASC) 5 MG tablet, TAKE 1 TABLET (5 MG TOTAL) BY MOUTH DAILY., Disp: 30 tablet, Rfl: 0   diclofenac Sodium (VOLTAREN) 1 % GEL, APPLY 2 GRAMS TO AFFECTED AREA 4 TIMES A DAY, Disp: 100 g, Rfl: 0   esomeprazole (NEXIUM) 20 MG capsule, TAKE 1 CAPSULE (20 MG TOTAL) BY MOUTH DAILY AT 12 NOON.,  Disp: 30 capsule, Rfl: 0   linaclotide (LINZESS) 72 MCG capsule, Take 1 capsule (72 mcg total) by mouth daily before breakfast., Disp: 90 capsule, Rfl: 1   pregabalin (LYRICA) 50 MG capsule, Take 50 mg by mouth 3 (three) times daily. , Disp: , Rfl:    Prucalopride Succinate (MOTEGRITY) 2 MG TABS, Take 1 tablet (2 mg total) by mouth daily., Disp: 30 tablet, Rfl: 3   valsartan-hydrochlorothiazide (DIOVAN-HCT) 160-25 MG tablet, Take 1 tablet by mouth daily., Disp: 90 tablet, Rfl: 1   traMADol (ULTRAM) 50 MG tablet, Take 1 tablet (50 mg total) by mouth every 12 (twelve) hours as needed for severe pain., Disp: 30 tablet, Rfl: 0  Review of Systems:  Negative unless indicated in HPI.   Physical Exam: Vitals:   05/18/23  1431 05/18/23 1436  BP: (!) 140/100 (!) 165/105  Pulse: 88   Temp: 98.4 F (36.9 C)   TempSrc: Oral   SpO2: 99%   Weight: 173 lb (78.5 kg)   Height: 5' 4.5" (1.638 m)     Body mass index is 29.24 kg/m.   Physical Exam Vitals reviewed.  Constitutional:      General: She is not in acute distress.    Appearance: Normal appearance. She is not ill-appearing, toxic-appearing or diaphoretic.  HENT:     Head: Normocephalic.     Right Ear: Tympanic membrane, ear canal and external ear normal. There is no impacted cerumen.     Left Ear: Tympanic membrane, ear canal and external ear normal. There is no impacted cerumen.     Nose: Nose normal.     Mouth/Throat:     Mouth: Mucous membranes are moist.     Pharynx: Oropharynx is clear. No oropharyngeal exudate or posterior oropharyngeal erythema.  Eyes:     General: No scleral icterus.       Right eye: No discharge.        Left eye: No discharge.     Conjunctiva/sclera: Conjunctivae normal.     Pupils: Pupils are equal, round, and reactive to light.  Neck:     Vascular: No carotid bruit.  Cardiovascular:     Rate and Rhythm: Normal rate and regular rhythm.     Pulses: Normal pulses.     Heart sounds: Normal heart sounds.  Pulmonary:     Effort: Pulmonary effort is normal. No respiratory distress.     Breath sounds: Normal breath sounds.  Abdominal:     General: Abdomen is flat. Bowel sounds are normal.     Palpations: Abdomen is soft.  Musculoskeletal:        General: Normal range of motion.     Cervical back: Normal range of motion.  Skin:    General: Skin is warm and dry.  Neurological:     General: No focal deficit present.     Mental Status: She is alert and oriented to person, place, and time. Mental status is at baseline.  Psychiatric:        Mood and Affect: Mood normal.        Behavior: Behavior normal.        Thought Content: Thought content normal.        Judgment: Judgment normal.     Flowsheet Row Office  Visit from 05/18/2023 in [facility] [place]  PHQ-9 Total Score 4        Impression and Plan:  Encounter for preventive health examination -     Digital Screening Mammogram, Left  and Right; Future  Primary hypertension -     CBC with Differential/Platelet; Future -     Comprehensive metabolic panel; Future -     Hemoglobin A1c; Future -     Lipid panel; Future -     TSH; Future -     Vitamin B12; Future -     VITAMIN D 25 Hydroxy (Vit-D Deficiency, Fractures); Future -     Valsartan-hydroCHLOROthiazide; Take 1 tablet by mouth daily.  Dispense: 90 tablet; Refill: 1  Encounter for hepatitis C screening test for low risk patient -     Hepatitis C antibody; Future  Encounter for screening for HIV -     HIV Antibody (routine testing w rflx); Future  Osteoarthritis of right knee, unspecified osteoarthritis type  Pain due to total left knee replacement, subsequent encounter -     traMADol HCl; Take 1 tablet (50 mg total) by mouth every 12 (twelve) hours as needed for severe pain.  Dispense: 30 tablet; Refill: 0  Failure of total knee replacement, initial encounter (HCC) -     traMADol HCl; Take 1 tablet (50 mg total) by mouth every 12 (twelve) hours as needed for severe pain.  Dispense: 30 tablet; Refill: 0  -Recommend routine eye and dental care. -Healthy lifestyle discussed in detail. -Labs to be updated today. -Prostate cancer screening: N/A Health Maintenance  Topic Date Due   Medicare Annual Wellness Visit  Never done   HIV Screening  Never done   Hepatitis C Screening  Never done   Mammogram  04/18/2022   COVID-19 Vaccine (1 - 2023-24 season) Never done   Pap Smear  05/18/2023*   Zoster (Shingles) Vaccine (1 of 2) 08/18/2023*   Flu Shot  06/03/2023   DTaP/Tdap/Td vaccine (2 - Tdap) 11/02/2028   Colon Cancer Screening  05/10/2030   HPV Vaccine  Aged Out  *Topic was postponed. The date shown is not the original due date.     -Declines  age-appropriate immunizations -Has had a complete hysterectomy, no longer needs Pap smears. -Mammogram requested. -Due to elevated blood pressure stop hydrochlorothiazide start Diovan HCT 160/25 mg in addition to amlodipine 5 mg daily. -Stressed importance of medication compliance.  Follow-up in 6 to 8 weeks.     Chaya Jan, MD [facility] [place]

## 2023-05-19 LAB — HIV ANTIBODY (ROUTINE TESTING W REFLEX): HIV 1&2 Ab, 4th Generation: NONREACTIVE

## 2023-05-19 LAB — VITAMIN B12: Vitamin B-12: 419 pg/mL (ref 211–911)

## 2023-05-19 LAB — VITAMIN D 25 HYDROXY (VIT D DEFICIENCY, FRACTURES): VITD: 16.08 ng/mL — ABNORMAL LOW (ref 30.00–100.00)

## 2023-05-19 LAB — HEMOGLOBIN A1C
Hgb A1c MFr Bld: 5.7 % of total Hgb — ABNORMAL HIGH (ref <5.7)
Mean Plasma Glucose: 117 mg/dL
eAG (mmol/L): 6.5 mmol/L

## 2023-05-19 LAB — HEPATITIS C ANTIBODY: Hepatitis C Ab: NONREACTIVE

## 2023-05-19 LAB — TSH: TSH: 3.17 u[IU]/mL (ref 0.35–5.50)

## 2023-05-31 ENCOUNTER — Other Ambulatory Visit: Payer: Self-pay | Admitting: Internal Medicine

## 2023-05-31 DIAGNOSIS — E559 Vitamin D deficiency, unspecified: Secondary | ICD-10-CM | POA: Insufficient documentation

## 2023-05-31 DIAGNOSIS — R718 Other abnormality of red blood cells: Secondary | ICD-10-CM

## 2023-05-31 MED ORDER — VITAMIN D (ERGOCALCIFEROL) 1.25 MG (50000 UNIT) PO CAPS
50000.0000 [IU] | ORAL_CAPSULE | ORAL | 0 refills | Status: AC
Start: 2023-05-31 — End: 2023-08-17

## 2023-06-02 ENCOUNTER — Encounter (INDEPENDENT_AMBULATORY_CARE_PROVIDER_SITE_OTHER): Payer: Self-pay

## 2023-06-02 ENCOUNTER — Other Ambulatory Visit (INDEPENDENT_AMBULATORY_CARE_PROVIDER_SITE_OTHER): Payer: 59

## 2023-06-02 DIAGNOSIS — R718 Other abnormality of red blood cells: Secondary | ICD-10-CM | POA: Diagnosis not present

## 2023-06-02 DIAGNOSIS — E559 Vitamin D deficiency, unspecified: Secondary | ICD-10-CM

## 2023-06-02 LAB — IBC + FERRITIN
Ferritin: 175.4 ng/mL (ref 10.0–291.0)
Iron: 44 ug/dL (ref 42–145)
Saturation Ratios: 15.3 % — ABNORMAL LOW (ref 20.0–50.0)
TIBC: 288.4 ug/dL (ref 250.0–450.0)
Transferrin: 206 mg/dL — ABNORMAL LOW (ref 212.0–360.0)

## 2023-06-02 LAB — VITAMIN D 25 HYDROXY (VIT D DEFICIENCY, FRACTURES): VITD: 23.48 ng/mL — ABNORMAL LOW (ref 30.00–100.00)

## 2023-06-03 ENCOUNTER — Ambulatory Visit: Payer: 59

## 2023-06-08 ENCOUNTER — Telehealth: Payer: Self-pay | Admitting: Internal Medicine

## 2023-06-08 NOTE — Telephone Encounter (Signed)
Pt called, returning CMA's call regarding labs. CMA was with a patient. Pt asked that CMA call back at her earliest convenience. 

## 2023-06-09 ENCOUNTER — Other Ambulatory Visit: Payer: Self-pay | Admitting: Internal Medicine

## 2023-06-09 NOTE — Telephone Encounter (Signed)
Called patient and also sent a my chart message

## 2023-06-09 NOTE — Telephone Encounter (Signed)
Message sent in my chart

## 2023-06-15 ENCOUNTER — Ambulatory Visit: Payer: 59

## 2023-06-23 ENCOUNTER — Other Ambulatory Visit: Payer: Self-pay | Admitting: Internal Medicine

## 2023-06-23 DIAGNOSIS — Z96659 Presence of unspecified artificial knee joint: Secondary | ICD-10-CM

## 2023-06-23 DIAGNOSIS — Z96652 Presence of left artificial knee joint: Secondary | ICD-10-CM

## 2023-06-29 ENCOUNTER — Ambulatory Visit: Payer: 59 | Admitting: Internal Medicine

## 2023-07-22 ENCOUNTER — Encounter: Payer: Self-pay | Admitting: Internal Medicine

## 2023-07-22 ENCOUNTER — Ambulatory Visit (INDEPENDENT_AMBULATORY_CARE_PROVIDER_SITE_OTHER): Payer: 59 | Admitting: Internal Medicine

## 2023-07-22 VITALS — BP 152/97 | HR 100 | Temp 98.7°F | Wt 174.2 lb

## 2023-07-22 DIAGNOSIS — Z96652 Presence of left artificial knee joint: Secondary | ICD-10-CM

## 2023-07-22 DIAGNOSIS — T8484XD Pain due to internal orthopedic prosthetic devices, implants and grafts, subsequent encounter: Secondary | ICD-10-CM

## 2023-07-22 DIAGNOSIS — K261 Acute duodenal ulcer with perforation: Secondary | ICD-10-CM

## 2023-07-22 DIAGNOSIS — I10 Essential (primary) hypertension: Secondary | ICD-10-CM

## 2023-07-22 DIAGNOSIS — Z96659 Presence of unspecified artificial knee joint: Secondary | ICD-10-CM

## 2023-07-22 DIAGNOSIS — T84018A Broken internal joint prosthesis, other site, initial encounter: Secondary | ICD-10-CM | POA: Diagnosis not present

## 2023-07-22 MED ORDER — TRAMADOL HCL 50 MG PO TABS
50.0000 mg | ORAL_TABLET | Freq: Two times a day (BID) | ORAL | 0 refills | Status: DC | PRN
Start: 2023-07-22 — End: 2023-09-14

## 2023-07-22 MED ORDER — VALSARTAN-HYDROCHLOROTHIAZIDE 160-25 MG PO TABS
1.0000 | ORAL_TABLET | Freq: Every day | ORAL | 1 refills | Status: DC
Start: 2023-07-22 — End: 2023-11-17

## 2023-07-22 MED ORDER — AMLODIPINE BESYLATE 5 MG PO TABS
5.0000 mg | ORAL_TABLET | Freq: Every day | ORAL | 1 refills | Status: DC
Start: 2023-07-22 — End: 2023-11-17

## 2023-07-22 NOTE — Progress Notes (Signed)
Established Patient Office Visit     CC/Reason for Visit: Follow-up chronic conditions  HPI: Deanna Rodgers is a 57 y.o. female who is coming in today for the above mentioned reasons.  Here to follow-up on her blood pressure after we started Diovan HCT last visit.  Unfortunately she thought that she was to discontinue amlodipine and her blood pressure remains elevated.  She has been compliant with Diovan HCT.  She would like to increase tramadol from once to twice daily due to her knee and hip pain and inability to take NSAIDs due to her peptic ulcer disease.  Requesting referral to physical therapy.  She has been having a lot of right knee swelling and pain at the site of her prior replacement.  She would like to see an orthopedist but a different one than the one that originally did her surgery.  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 -SEVERAL RT KNEE PROCEDURES SINCE BECAUSE OF PAIN AND SWELLING IN THE KNEE   Blood transfusion without reported diagnosis    Depression    GERD (gastroesophageal reflux disease)    History of duodenal ulcer    History of gastroesophageal reflux (GERD)    Hypertension    Pain    LOWER BACK     Past Surgical History:  Procedure Laterality Date   CESAREAN SECTION  12-24-11   '85- x1   COLONOSCOPY     ENDOMETRIAL ABLATION     hemorroidectomy  yrs ago   JOINT REPLACEMENT  12/2011   rt total knee   KNEE ARTHROPLASTY  12/25/2011   Procedure: COMPUTER ASSISTED TOTAL KNEE ARTHROPLASTY;  Surgeon: [name], MD;  Location: [facility];  Service: Orthopedics;  Laterality: Right;   KNEE ARTHROSCOPY  12-24-11   x2 -last 12'12([facility])   KNEE ARTHROSCOPY Right 12/23/2012   Procedure: Right Knee Arthroscopy with minimal Debridement ;  Surgeon: [name], MD;  Location: [facility];  Service:  Orthopedics;  Laterality: Right;  Right Knee Arthroscopy with minimal  Debridement    TOTAL KNEE REVISION Right 04/14/2013   Procedure: Synovectomy right total knee with poly exchange;  Surgeon: [name], MD;  Location: [facility];  Service: Orthopedics;  Laterality: Right;   TOTAL KNEE REVISION Right 08/11/2013   Procedure: REVISION RIGHT TOTAL KNEE ARTHROPLASTY;  Surgeon: [name], MD;  Location: [facility];  Service: Orthopedics;  Laterality: Right;   TUBAL LIGATION     UPPER GASTROINTESTINAL ENDOSCOPY      Social History:  reports that she has never smoked. She has never used smokeless tobacco. She reports that she does not drink alcohol and does not use drugs.  Allergies: No Known Allergies  Family History:  Family History  Problem Relation Age of Onset   Hypertension Maternal Grandmother    Stroke Maternal Grandmother    Hypertension Father    Stroke Father    Hypertension Maternal Aunt    Cancer Maternal Uncle    Cancer Paternal Uncle        Colon   Colon cancer Neg Hx    Esophageal cancer Neg Hx    Rectal cancer Neg Hx    Stomach cancer Neg Hx      Current Outpatient Medications:    diclofenac Sodium (VOLTAREN) 1 %  GEL, APPLY 2 GRAMS TO AFFECTED AREA 4 TIMES A DAY, Disp: 100 g, Rfl: 0   esomeprazole (NEXIUM) 20 MG capsule, TAKE 1 CAPSULE (20 MG TOTAL) BY MOUTH DAILY AT 12 NOON., Disp: 30 capsule, Rfl: 0   linaclotide (LINZESS) 72 MCG capsule, Take 1 capsule (72 mcg total) by mouth daily before breakfast., Disp: 90 capsule, Rfl: 1   pregabalin (LYRICA) 50 MG capsule, Take 50 mg by mouth 3 (three) times daily. , Disp: , Rfl:    Prucalopride Succinate (MOTEGRITY) 2 MG TABS, Take 1 tablet (2 mg total) by mouth daily., Disp: 30 tablet, Rfl: 3   Vitamin D, Ergocalciferol, (DRISDOL) 1.25 MG (50000 UNIT) CAPS capsule, Take 1 capsule (50,000 Units total) by mouth every 7 (seven) days for 12 doses., Disp: 12 capsule, Rfl: 0   amLODipine (NORVASC) 5 MG tablet,  Take 1 tablet (5 mg total) by mouth daily., Disp: 90 tablet, Rfl: 1   traMADol (ULTRAM) 50 MG tablet, Take 1 tablet (50 mg total) by mouth every 12 (twelve) hours as needed for severe pain., Disp: 60 tablet, Rfl: 0   valsartan-hydrochlorothiazide (DIOVAN-HCT) 160-25 MG tablet, Take 1 tablet by mouth daily., Disp: 90 tablet, Rfl: 1  Review of Systems:  Negative unless indicated in HPI.   Physical Exam: Vitals:   07/22/23 1603 07/22/23 1607  BP: (!) 154/100 (!) 152/97  Pulse: 100   Temp: 98.7 F (37.1 C)   TempSrc: Oral   SpO2: 98%   Weight: 174 lb 3.2 oz (79 kg)     Body mass index is 29.44 kg/m.   Physical Exam Vitals reviewed.  Constitutional:      Appearance: Normal appearance.  HENT:     Head: Normocephalic and atraumatic.  Eyes:     Conjunctiva/sclera: Conjunctivae normal.     Pupils: Pupils are equal, round, and reactive to light.  Cardiovascular:     Rate and Rhythm: Normal rate and regular rhythm.  Pulmonary:     Effort: Pulmonary effort is normal.     Breath sounds: Normal breath sounds.  Skin:    General: Skin is warm and dry.  Neurological:     General: No focal deficit present.     Mental Status: She is alert and oriented to person, place, and time.  Psychiatric:        Mood and Affect: Mood normal.        Behavior: Behavior normal.        Thought Content: Thought content normal.        Judgment: Judgment normal.      Impression and Plan:  Acute duodenal ulcer with perforation (HCC)  Pain due to total left knee replacement, subsequent encounter -     traMADol HCl; Take 1 tablet (50 mg total) by mouth every 12 (twelve) hours as needed for severe pain.  Dispense: 60 tablet; Refill: 0  Failure of total knee replacement, initial encounter (HCC) -     traMADol HCl; Take 1 tablet (50 mg total) by mouth every 12 (twelve) hours as needed for severe pain.  Dispense: 60 tablet; Refill: 0 -     Ambulatory referral to Orthopedic Surgery -     Ambulatory  referral to Physical Therapy  Primary hypertension -     amLODIPine Besylate; Take 1 tablet (5 mg total) by mouth daily.  Dispense: 90 tablet; Refill: 1 -     Valsartan-hydroCHLOROthiazide; Take 1 tablet by mouth daily.  Dispense: 90 tablet; Refill: 1   -Continue Diovan HCT,  recent amlodipine, counseled to take both together for blood pressure. -Referrals to physical therapy and orthopedics placed. -Tramadol sent.  Time spent:33 minutes reviewing chart, interviewing and examining patient and formulating plan of care.     Chaya Jan, MD [facility]

## 2023-07-28 ENCOUNTER — Ambulatory Visit: Payer: 59 | Admitting: Orthopaedic Surgery

## 2023-08-02 ENCOUNTER — Telehealth: Payer: Self-pay | Admitting: Internal Medicine

## 2023-08-02 NOTE — Telephone Encounter (Signed)
Requesting call back regarding paperwork that was faxed over

## 2023-08-03 NOTE — Telephone Encounter (Signed)
Spoke to patient and explained that Dr Ardyth Harps is out of the office until 08/09/23.

## 2023-08-04 ENCOUNTER — Ambulatory Visit: Payer: 59 | Admitting: Internal Medicine

## 2023-08-04 ENCOUNTER — Telehealth: Payer: Self-pay | Admitting: Orthopaedic Surgery

## 2023-08-04 ENCOUNTER — Ambulatory Visit: Payer: 59 | Admitting: Orthopaedic Surgery

## 2023-08-04 NOTE — Telephone Encounter (Signed)
Called [place]. Left message that Dr.Xu did not order any type of power chair, and we have never seen patient in the office before.

## 2023-08-04 NOTE — Telephone Encounter (Signed)
Doesn't look like it's my patient.

## 2023-08-04 NOTE — Telephone Encounter (Signed)
Samantha from [employer] called and ask patient came for power mobility today and height and weight should be there in the paperwork and ICD should be there as well. CB#[phone] Ext. [phone] Need it to be faxed in three business days. [phone]

## 2023-08-09 ENCOUNTER — Ambulatory Visit: Payer: 59 | Attending: Internal Medicine

## 2023-08-10 ENCOUNTER — Telehealth: Payer: Self-pay

## 2023-08-10 NOTE — Telephone Encounter (Signed)
Forms have been documented that Dr.Xu is not ordering or evaluating for a power chair, and will be faxed back.  [employer] was contacted on 08/04/2023 and notified as well.

## 2023-08-10 NOTE — Telephone Encounter (Signed)
-----   Message from [place] Z sent at 08/10/2023  2:34 PM EDT ----- Regarding: Secure  Upcoming appt.,   08-12-23   Eval for Power Wheel Chair Deanna Rodgers  I received document from [employer] requesting a call from Dr. Roda Shutters, MD prior to appt., 08/12/23 where he is doing a eval for power wheelchair. I dropped out this am the fax from them and it is also scanned in chart for they resent it again. Just a fyi.  The message on the fax is stating please call them before the appt. to discuss the order.  Thx Tisha

## 2023-08-10 NOTE — Telephone Encounter (Signed)
Called patient. She has an appointment this Thursday for evaluation for a power chair. Notified patient that Dr.Xu does not prescribe these. She needs to contact her PCP. She said that she was not coming for that, only for her knees.

## 2023-08-12 ENCOUNTER — Ambulatory Visit: Payer: 59 | Admitting: Orthopaedic Surgery

## 2023-08-17 ENCOUNTER — Other Ambulatory Visit (INDEPENDENT_AMBULATORY_CARE_PROVIDER_SITE_OTHER): Payer: 59

## 2023-08-17 ENCOUNTER — Encounter: Payer: Self-pay | Admitting: Orthopaedic Surgery

## 2023-08-17 ENCOUNTER — Ambulatory Visit: Payer: 59 | Admitting: Orthopaedic Surgery

## 2023-08-17 DIAGNOSIS — M25561 Pain in right knee: Secondary | ICD-10-CM

## 2023-08-17 DIAGNOSIS — M25562 Pain in left knee: Secondary | ICD-10-CM

## 2023-08-17 DIAGNOSIS — G8929 Other chronic pain: Secondary | ICD-10-CM | POA: Diagnosis not present

## 2023-08-17 NOTE — Addendum Note (Signed)
Addended by: Wendi Maya on: 08/17/2023 01:59 PM   Modules accepted: Orders

## 2023-08-17 NOTE — Progress Notes (Signed)
Office Visit Note   Patient: Deanna Rodgers           Date of Birth: [birth year]           MRN: [id number] Visit Date: 08/17/2023              Requested by: Philip Aspen, Limmie Patricia, MD [address],  [place] [address] PCP: Philip Aspen, Limmie Patricia, MD   Assessment & Plan: Visit Diagnoses:  1. Chronic pain of both knees     Plan: Ms. Remlinger is a 57 year old female with chronic bilateral knee pain.  She is in a tough situation and that the right knee continues to be in pain despite multiple operations.  I think the chances of improving her pain with another surgery are not good and fraught with the risk and complication.  For the left knee she will continue with conservative management.  Understandably based on her experience she is not interested in any surgical interventions at this time.  She did request a referral to chronic pain management clinic which I have placed.  Will see her back as needed.    Follow-Up Instructions: No follow-ups on file.   Orders:  Orders Placed This Encounter  Procedures   XR KNEE 3 VIEW LEFT   XR KNEE 3 VIEW RIGHT   No orders of the defined types were placed in this encounter.     Procedures: No procedures performed   Clinical Data: No additional findings.   Subjective: Chief Complaint  Patient presents with   Right Knee - Pain   Left Knee - Pain    HPI Ms. Deanna Rodgers is a 57 year old female comes in for evaluation of chronic bilateral knee pain.  In regards to the right knee she underwent a index right total knee replacement and 2013 by Dr. Magnus Ivan which was then revised to cemented stemmed components a year later.  She continues to endorse continued pain in the right knee and swelling and enlargement of the leg.  She is unhappy with the fact that she continues to have pain.  She has been to other orthopedic surgeons here in [place] for second opinions.  In regards to the left knee she is endorsing of start up  pain and stiffness.  She has had cortisone injections at [facility] for this.  Review of Systems  Constitutional: Negative.   HENT: Negative.    Eyes: Negative.   Respiratory: Negative.    Cardiovascular: Negative.   Endocrine: Negative.   Musculoskeletal: Negative.   Neurological: Negative.   Hematological: Negative.   Psychiatric/Behavioral: Negative.    All other systems reviewed and are negative.    Objective: Vital Signs: There were no vitals taken for this visit.  Physical Exam Vitals and nursing note reviewed.  Constitutional:      Appearance: She is well-developed.  HENT:     Head: Atraumatic.     Nose: Nose normal.  Eyes:     Extraocular Movements: Extraocular movements intact.  Cardiovascular:     Pulses: Normal pulses.  Pulmonary:     Effort: Pulmonary effort is normal.  Abdominal:     Palpations: Abdomen is soft.  Musculoskeletal:     Cervical back: Neck supple.  Skin:    General: Skin is warm.     Capillary Refill: Capillary refill takes less than 2 seconds.  Neurological:     Mental Status: She is alert. Mental status is at baseline.  Psychiatric:  Behavior: Behavior normal.        Thought Content: Thought content normal.        Judgment: Judgment normal.     Ortho Exam Exam of the right knee shows a fully healed surgical scar.  Range of motion is 0 to 110 degrees.  Collaterals are stable.  No signs of infection.  Exam of the left knee shows no joint effusion.  No significant joint line tenderness. Specialty Comments:  No specialty comments available.  Imaging: No results found.   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   Painful total knee replacement (HCC) 04/14/2013   Synovitis of knee 12/23/2012   Hypertension 10/16/2012   Overdose 10/15/2012   Degenerative arthritis of knee 12/25/2011   Internal hemorrhoids  10/14/2010   External hemorrhoids 10/14/2010   GERD 10/14/2010   Duodenal ulcer without hemorrhage or perforation and without obstruction 10/14/2010   Irritable bowel syndrome 10/14/2010   Acute duodenal ulcer with perforation (HCC) 10/28/2009   IRON DEFICIENCY 08/08/2009   DYSPEPSIA&OTHER SPEC DISORDERS FUNCTION STOMACH 08/08/2009   RECTAL BLEEDING 08/08/2009   Past Medical History:  Diagnosis Date   Abnormal Pap smear    bx   Anemia    Anxiety    Arthritis    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 -SEVERAL RT KNEE PROCEDURES SINCE BECAUSE OF PAIN AND SWELLING IN THE KNEE   Blood transfusion without reported diagnosis    Depression    GERD (gastroesophageal reflux disease)    History of duodenal ulcer    History of gastroesophageal reflux (GERD)    Hypertension    Pain    LOWER BACK     Family History  Problem Relation Age of Onset   Hypertension Maternal Grandmother    Stroke Maternal Grandmother    Hypertension Father    Stroke Father    Hypertension Maternal Aunt    Cancer Maternal Uncle    Cancer Paternal Uncle        Colon   Colon cancer Neg Hx    Esophageal cancer Neg Hx    Rectal cancer Neg Hx    Stomach cancer Neg Hx     Past Surgical History:  Procedure Laterality Date   CESAREAN SECTION  12-24-11   '85- x1   COLONOSCOPY     ENDOMETRIAL ABLATION     hemorroidectomy  yrs ago   JOINT REPLACEMENT  12/2011   rt total knee   KNEE ARTHROPLASTY  12/25/2011   Procedure: COMPUTER ASSISTED TOTAL KNEE ARTHROPLASTY;  Surgeon: [name], MD;  Location: [facility];  Service: Orthopedics;  Laterality: Right;   KNEE ARTHROSCOPY  12-24-11   x2 -[facility] 12'[facility]   KNEE ARTHROSCOPY Right 12/23/2012   Procedure: Right Knee Arthroscopy with minimal Debridement ;  Surgeon: [name], MD;  Location: [facility];  Service: Orthopedics;  Laterality: Right;  Right Knee Arthroscopy with minimal  Debridement    TOTAL KNEE REVISION Right 04/14/2013    Procedure: Synovectomy right total knee with poly exchange;  Surgeon: [name], MD;  Location: [facility];  Service: Orthopedics;  Laterality: Right;   TOTAL KNEE REVISION Right 08/11/2013   Procedure: REVISION RIGHT TOTAL KNEE ARTHROPLASTY;  Surgeon: [name], MD;  Location: [facility];  Service: Orthopedics;  Laterality: Right;   TUBAL LIGATION     UPPER GASTROINTESTINAL ENDOSCOPY  Social History   Occupational History   Not on file  Tobacco Use   Smoking status: Never   Smokeless tobacco: Never  Vaping Use   Vaping status: Never Used  Substance and Sexual Activity   Alcohol use: No   Drug use: No   Sexual activity: Yes    Birth control/protection: Surgical    Comment: 1st intercourse 18 yo-5 partners-BTL

## 2023-08-24 ENCOUNTER — Ambulatory Visit: Payer: 59 | Admitting: Internal Medicine

## 2023-09-14 ENCOUNTER — Other Ambulatory Visit: Payer: Self-pay | Admitting: Internal Medicine

## 2023-09-14 DIAGNOSIS — Z96659 Presence of unspecified artificial knee joint: Secondary | ICD-10-CM

## 2023-09-14 DIAGNOSIS — Z96652 Presence of left artificial knee joint: Secondary | ICD-10-CM

## 2023-09-17 ENCOUNTER — Other Ambulatory Visit: Payer: Self-pay | Admitting: Internal Medicine

## 2023-09-17 DIAGNOSIS — M65969 Unspecified synovitis and tenosynovitis, unspecified lower leg: Secondary | ICD-10-CM

## 2023-09-17 NOTE — Telephone Encounter (Signed)
Pt also requesting rx for lidocaine (LIDODERM) 5 %

## 2023-09-24 ENCOUNTER — Ambulatory Visit (INDEPENDENT_AMBULATORY_CARE_PROVIDER_SITE_OTHER): Payer: 59

## 2023-09-24 VITALS — Ht 64.5 in | Wt 174.0 lb

## 2023-09-24 DIAGNOSIS — Z0000 Encounter for general adult medical examination without abnormal findings: Secondary | ICD-10-CM

## 2023-09-24 NOTE — Progress Notes (Signed)
Subjective:   Deanna Rodgers is a 57 y.o. female who presents for Medicare Annual (Subsequent) preventive examination.  Visit Complete: Virtual I connected with  Deanna Rodgers on 09/24/23 by a audio enabled telemedicine application and verified that I am speaking with the correct person using two identifiers.  Patient Location: Home  Provider Location: Home Office  I discussed the limitations of evaluation and management by telemedicine. The patient expressed understanding and agreed to proceed.  Vital Signs: Because this visit was a virtual/telehealth visit, some criteria may be missing or patient reported. Any vitals not documented were not able to be obtained and vitals that have been documented are patient reported.    Cardiac Risk Factors include: advanced age (>65men, >28 women);hypertension     Objective:    Today's Vitals   09/24/23 1129 09/24/23 1130  Weight: 174 lb (78.9 kg)   Height: 5' 4.5" (1.638 m)   PainSc:  7    Body mass index is 29.41 kg/m.     09/24/2023   11:38 AM 07/02/2022    3:45 PM 01/12/2017    8:44 PM 05/03/2016    7:30 PM 10/22/2015    9:56 AM 08/11/2013    2:40 PM 08/02/2013    1:08 PM  Advanced Directives  Does Patient Have a Medical Advance Directive? Yes No No Yes No Patient has advance directive, copy in chart Patient has advance directive, copy in chart  Type of Advance Directive Healthcare Power of [place];Living will   Healthcare Power of [place];Living will  Healthcare Power of [place];Living will Healthcare Power of [place];Living will  Copy of Healthcare Power of Attorney in Chart? No - copy requested        Would patient like information on creating a medical advance directive?   No - Patient declined  No - patient declined information    Pre-existing out of facility DNR order (yellow form or pink MOST form)      No     Current Medications (verified) Outpatient Encounter Medications as of 09/24/2023  Medication Sig    amLODipine (NORVASC) 5 MG tablet Take 1 tablet (5 mg total) by mouth daily.   diclofenac Sodium (VOLTAREN) 1 % GEL APPLY 2 GRAMS TO AFFECTED AREA 4 TIMES A DAY   esomeprazole (NEXIUM) 20 MG capsule TAKE 1 CAPSULE (20 MG TOTAL) BY MOUTH DAILY AT 12 NOON.   linaclotide (LINZESS) 72 MCG capsule Take 1 capsule (72 mcg total) by mouth daily before breakfast.   pregabalin (LYRICA) 50 MG capsule Take 50 mg by mouth 3 (three) times daily.    Prucalopride Succinate (MOTEGRITY) 2 MG TABS Take 1 tablet (2 mg total) by mouth daily.   traMADol (ULTRAM) 50 MG tablet TAKE 1 TABLET (50 MG TOTAL) BY MOUTH EVERY 12 (TWELVE) HOURS AS NEEDED FOR SEVERE PAIN.   valsartan-hydrochlorothiazide (DIOVAN-HCT) 160-25 MG tablet Take 1 tablet by mouth daily.   No facility-administered encounter medications on file as of 09/24/2023.    Allergies (verified) Patient has no known allergies.   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 -SEVERAL RT KNEE PROCEDURES SINCE BECAUSE OF PAIN AND SWELLING IN THE KNEE   Blood transfusion without reported diagnosis    Depression    GERD (gastroesophageal reflux disease)    History of duodenal ulcer    History  of gastroesophageal reflux (GERD)    Hypertension    Pain    LOWER BACK    Past Surgical History:  Procedure Laterality Date   CESAREAN SECTION  12-24-11   '85- x1   COLONOSCOPY     ENDOMETRIAL ABLATION     hemorroidectomy  yrs ago   JOINT REPLACEMENT  12/2011   rt total knee   KNEE ARTHROPLASTY  12/25/2011   Procedure: COMPUTER ASSISTED TOTAL KNEE ARTHROPLASTY;  Surgeon: [name], MD;  Location: [facility];  Service: Orthopedics;  Laterality: Right;   KNEE ARTHROSCOPY  12-24-11   x2 -[facility] 12'[facility])   KNEE ARTHROSCOPY Right 12/23/2012   Procedure: Right Knee Arthroscopy with minimal Debridement ;  Surgeon: [name],  MD;  Location: [facility];  Service: Orthopedics;  Laterality: Right;  Right Knee Arthroscopy with minimal  Debridement    TOTAL KNEE REVISION Right 04/14/2013   Procedure: Synovectomy right total knee with poly exchange;  Surgeon: [name], MD;  Location: [facility];  Service: Orthopedics;  Laterality: Right;   TOTAL KNEE REVISION Right 08/11/2013   Procedure: REVISION RIGHT TOTAL KNEE ARTHROPLASTY;  Surgeon: [name], MD;  Location: [facility];  Service: Orthopedics;  Laterality: Right;   TUBAL LIGATION     UPPER GASTROINTESTINAL ENDOSCOPY     Family History  Problem Relation Age of Onset   Hypertension Maternal Grandmother    Stroke Maternal Grandmother    Hypertension Father    Stroke Father    Hypertension Maternal Aunt    Cancer Maternal Uncle    Cancer Paternal Uncle        Colon   Colon cancer Neg Hx    Esophageal cancer Neg Hx    Rectal cancer Neg Hx    Stomach cancer Neg Hx    Social History   Socioeconomic History   Marital status: Single    Spouse name: Not on file   Number of children: Not on file   Years of education: Not on file   Highest education level: Not on file  Occupational History   Not on file  Tobacco Use   Smoking status: Never   Smokeless tobacco: Never  Vaping Use   Vaping status: Never Used  Substance and Sexual Activity   Alcohol use: No   Drug use: No   Sexual activity: Yes    Birth control/protection: Surgical    Comment: 1st intercourse 18 yo-5 partners-BTL  Other Topics Concern   Not on file  Social History Narrative   Not on file   Social Determinants of Health   Financial Resource Strain: Low Risk  (09/24/2023)   Overall Financial Resource Strain (CARDIA)    Difficulty of Paying Living Expenses: Not hard at all  Food Insecurity: No Food Insecurity (09/24/2023)   Hunger Vital Sign    Worried About Running Out of Food in the Last Year: Never true    Ran Out of Food in the Last Year: Never true  Transportation  Needs: No Transportation Needs (09/24/2023)   PRAPARE - Administrator, Civil Service (Medical): No    Lack of Transportation (Non-Medical): No  Physical Activity: Inactive (09/24/2023)   Exercise Vital Sign    Days of Exercise per Week: 0 days    Minutes of Exercise per Session: 0 min  Stress: No Stress Concern Present (09/24/2023)   [employer] of Occupational Health - Occupational Stress Questionnaire    Feeling of Stress : Not at all  Social Connections: Moderately Integrated (09/24/2023)   Social Connection and Isolation Panel [NHANES]    Frequency of Communication with Friends and Family: More than three times a week    Frequency of Social Gatherings with Friends and Family: More than three times a week    Attends Religious Services: More than 4 times per year    Active Member of [employer] or Organizations: Yes    Attends Engineer, structural: More than 4 times per year    Marital Status: Divorced    Tobacco Counseling Counseling given: Not Answered   Clinical Intake:  Pre-visit preparation completed: Yes Activities of Daily Living    09/24/2023   11:34 AM  In your present state of health, do you have any difficulty performing the following activities:  Hearing? 0  Vision? 0  Difficulty concentrating or making decisions? 0  Walking or climbing stairs? 0  Dressing or bathing? 0  Doing errands, shopping? 0  Preparing Food and eating ? N  Using the Toilet? N  In the past six months, have you accidently leaked urine? Y  Comment Followed by PCP  Do you have problems with loss of bowel control? N  Managing your Medications? N  Managing your Finances? N  Housekeeping or managing your Housekeeping? N    Patient Care Team: Philip Aspen, Limmie Patricia, MD as PCP - General (Internal Medicine)  Indicate any recent Medical Services you may have received from other than [facility] providers in the past year (date may be approximate).     Assessment:    This is a routine wellness examination for [place].  Hearing/Vision screen Hearing Screening - Comments:: Denies hearing difficulties   Vision Screening - Comments:: Wears rx glasses - up to date with routine eye exams with  Deferred   Goals Addressed               This Visit's Progress     Get Knee pain controled (pt-stated)         Depression Screen    09/24/2023   11:33 AM 07/22/2023    4:22 PM 05/18/2023    2:29 PM 03/27/2020   11:43 AM  PHQ 2/9 Scores  PHQ - 2 Score 0 0 2 2  PHQ- 9 Score   4 7    Fall Risk    09/24/2023   11:36 AM 07/22/2023    4:22 PM 05/18/2023    2:29 PM  Fall Risk   Falls in the past year? 0 0 0  Number falls in past yr: 0 0 0  Injury with Fall? 0 0 0  Risk for fall due to : No Fall Risks    Follow up Falls prevention discussed Falls evaluation completed Falls evaluation completed    MEDICARE RISK AT HOME: Medicare Risk at Home Any stairs in or around the home?: No If so, are there any without handrails?: No Home free of loose throw rugs in walkways, pet beds, electrical cords, etc?: Yes Adequate lighting in your home to reduce risk of falls?: Yes Life alert?: No Use of a cane, walker or w/c?: No Grab bars in the bathroom?: Yes Shower chair or bench in shower?: Yes Elevated toilet seat or a handicapped toilet?: No  TIMED UP AND GO:  Was the test performed?  No    Cognitive Function:        09/24/2023   11:39 AM  6CIT Screen  What Year? 0 points  What month? 0 points  What time? 0  points  Count back from 20 0 points  Months in reverse 0 points  Repeat phrase 4 points  Total Score 4 points    Immunizations Immunization History  Administered Date(s) Administered   Td 11/02/2018    TDAP status: Up to date  Flu Vaccine status: Due, Education has been provided regarding the importance of this vaccine. Advised may receive this vaccine at local pharmacy or Health Dept. Aware to provide a copy of the vaccination record if  obtained from local pharmacy or Health Dept. Verbalized acceptance and understanding.   Covid-19 vaccine status: Declined, Education has been provided regarding the importance of this vaccine but patient still declined. Advised may receive this vaccine at local pharmacy or Health Dept.or vaccine clinic. Aware to provide a copy of the vaccination record if obtained from local pharmacy or Health Dept. Verbalized acceptance and understanding.    Screening Tests Health Maintenance  Topic Date Due   Zoster Vaccines- Shingrix (1 of 2) Never done   MAMMOGRAM  04/18/2022   Cervical Cancer Screening (HPV/Pap Cotest)  04/06/2023   COVID-19 Vaccine (1 - 2023-24 season) Never done   INFLUENZA VACCINE  01/31/2024 (Originally 06/03/2023)   Medicare Annual Wellness (AWV)  09/23/2024   DTaP/Tdap/Td (2 - Tdap) 11/02/2028   Colonoscopy  05/10/2030   Hepatitis C Screening  Completed   HIV Screening  Completed   HPV VACCINES  Aged Out    Health Maintenance  Health Maintenance Due  Topic Date Due   Zoster Vaccines- Shingrix (1 of 2) Never done   MAMMOGRAM  04/18/2022   Cervical Cancer Screening (HPV/Pap Cotest)  04/06/2023   COVID-19 Vaccine (1 - 2023-24 season) Never done    Colorectal cancer screening: Type of screening: Colonoscopy. Completed 05/10/20. Repeat every 10 years  Mammogram status: Ordered 05/18/23. Pt provided with contact info and advised to call to schedule appt.       Additional Screening:  Hepatitis C Screening: does qualify; Completed 05/31/23  Vision Screening: Recommended annual ophthalmology exams for early detection of glaucoma and other disorders of the eye. Is the patient up to date with their annual eye exam?  Yes  Who is the provider or what is the name of the office in which the patient attends annual eye exams? Deferred If pt is not established with a provider, would they like to be referred to a provider to establish care? No .   Dental Screening: Recommended  annual dental exams for proper oral hygiene   Community Resource Referral / Chronic Care Management:  CRR required this visit?  No   CCM required this visit?  No     Plan:     I have personally reviewed and noted the following in the patient's chart:   Medical and social history Use of alcohol, tobacco or illicit drugs  Current medications and supplements including opioid prescriptions. Patient is currently taking opioid prescriptions. Information provided to patient regarding non-opioid alternatives. Patient advised to discuss non-opioid treatment plan with their provider. Functional ability and status Nutritional status Physical activity Advanced directives List of other physicians Hospitalizations, surgeries, and ER visits in previous 12 months Vitals Screenings to include cognitive, depression, and falls Referrals and appointments  In addition, I have reviewed and discussed with patient certain preventive protocols, quality metrics, and best practice recommendations. A written personalized care plan for preventive services as well as general preventive health recommendations were provided to patient.     Tillie Rung, LPN   81/19/1478   After  Visit Summary: (MyChart) Due to this being a telephonic visit, the after visit summary with patients personalized plan was offered to patient via MyChart   Nurse Notes: None

## 2023-09-24 NOTE — Patient Instructions (Addendum)
Deanna Rodgers , Thank you for taking time to come for your Medicare Wellness Visit. I appreciate your ongoing commitment to your health goals. Please review the following plan we discussed and let me know if I can assist you in the future.   Referrals/Orders/Follow-Ups/Clinician Recommendations:   This is a list of the screening recommended for you and due dates:  Health Maintenance  Topic Date Due   Zoster (Shingles) Vaccine (1 of 2) Never done   Mammogram  04/18/2022   Pap with HPV screening  04/06/2023   COVID-19 Vaccine (1 - 2023-24 season) Never done   Flu Shot  01/31/2024*   Medicare Annual Wellness Visit  09/23/2024   DTaP/Tdap/Td vaccine (2 - Tdap) 11/02/2028   Colon Cancer Screening  05/10/2030   Hepatitis C Screening  Completed   HIV Screening  Completed   HPV Vaccine  Aged Out  *Topic was postponed. The date shown is not the original due date.   Opioid Pain Medicine Management Opioids are powerful medicines that are used to treat moderate to severe pain. When used for short periods of time, they can help you to: Sleep better. Do better in physical or occupational therapy. Feel better in the first few days after an injury. Recover from surgery. Opioids should be taken with the supervision of a trained health care provider. They should be taken for the shortest period of time possible. This is because opioids can be addictive, and the longer you take opioids, the greater your risk of addiction. This addiction can also be called opioid use disorder. What are the risks? Using opioid pain medicines for longer than 3 days increases your risk of side effects. Side effects include: Constipation. Nausea and vomiting. Breathing difficulties (respiratory depression). Drowsiness. Confusion. Opioid use disorder. Itching. Taking opioid pain medicine for a long period of time can affect your ability to do daily tasks. It also puts you at risk for: Motor vehicle  crashes. Depression. Suicide. Heart attack. Overdose, which can be life-threatening. What is a pain treatment plan? A pain treatment plan is an agreement between you and your health care provider. Pain is unique to each person, and treatments vary depending on your condition. To manage your pain, you and your health care provider need to work together. To help you do this: Discuss the goals of your treatment, including how much pain you might expect to have and how you will manage the pain. Review the risks and benefits of taking opioid medicines. Remember that a good treatment plan uses more than one approach and minimizes the chance of side effects. Be honest about the amount of medicines you take and about any drug or alcohol use. Get pain medicine prescriptions from only one health care provider. Pain can be managed with many types of alternative treatments. Ask your health care provider to refer you to one or more specialists who can help you manage pain through: Physical or occupational therapy. Counseling (cognitive behavioral therapy). Good nutrition. Biofeedback. Massage. Meditation. Non-opioid medicine. Following a gentle exercise program. How to use opioid pain medicine Taking medicine Take your pain medicine exactly as told by your health care provider. Take it only when you need it. If your pain gets less severe, you may take less than your prescribed dose if your health care provider approves. If you are not having pain, do nottake pain medicine unless your health care provider tells you to take it. If your pain is severe, do nottry to treat it yourself by taking  more pills than instructed on your prescription. Contact your health care provider for help. Write down the times when you take your pain medicine. It is easy to become confused while on pain medicine. Writing the time can help you avoid overdose. Take other over-the-counter or prescription medicines only as told by  your health care provider. Keeping yourself and others safe  While you are taking opioid pain medicine: Do not drive, use machinery, or power tools. Do not sign legal documents. Do not drink alcohol. Do not take sleeping pills. Do not supervise children by yourself. Do not do activities that require climbing or being in high places. Do not go to a lake, river, ocean, spa, or swimming pool. Do not share your pain medicine with anyone. Keep pain medicine in a locked cabinet or in a secure area where pets and children cannot reach it. Stopping your use of opioids If you have been taking opioid medicine for more than a few weeks, you may need to slowly decrease (taper) how much you take until you stop completely. Tapering your use of opioids can decrease your risk of symptoms of withdrawal, such as: Pain and cramping in the abdomen. Nausea. Sweating. Sleepiness. Restlessness. Uncontrollable shaking (tremors). Cravings for the medicine. Do not attempt to taper your use of opioids on your own. Talk with your health care provider about how to do this. Your health care provider may prescribe a step-down schedule based on how much medicine you are taking and how long you have been taking it. Getting rid of leftover pills Do not save any leftover pills. Get rid of leftover pills safely by: Taking the medicine to a prescription take-back program. This is usually offered by the county or law enforcement. Bringing them to a pharmacy that has a drug disposal container. Flushing them down the toilet. Check the label or package insert of your medicine to see whether this is safe to do. Throwing them out in the trash. Check the label or package insert of your medicine to see whether this is safe to do. If it is safe to throw it out, remove the medicine from the original container, put it into a sealable bag or container, and mix it with used coffee grounds, food scraps, dirt, or cat litter before putting  it in the trash. Follow these instructions at home: Activity Do exercises as told by your health care provider. Avoid activities that make your pain worse. Return to your normal activities as told by your health care provider. Ask your health care provider what activities are safe for you. General instructions You may need to take these actions to prevent or treat constipation: Drink enough fluid to keep your urine pale yellow. Take over-the-counter or prescription medicines. Eat foods that are high in fiber, such as beans, whole grains, and fresh fruits and vegetables. Limit foods that are high in fat and processed sugars, such as fried or sweet foods. Keep all follow-up visits. This is important. Where to find support If you have been taking opioids for a long time, you may benefit from receiving support for quitting from a local support group or counselor. Ask your health care provider for a referral to these resources in your area. Where to find more information Centers for Disease Control and Prevention (CDC): FootballExhibition.com.br [place] Food and Drug Administration (FDA): PumpkinSearch.com.ee Get help right away if: You may have taken too much of an opioid (overdosed). Common symptoms of an overdose: Your breathing is slower or more shallow  than normal. You have a very slow heartbeat (pulse). You have slurred speech. You have nausea and vomiting. Your pupils become very small. You have other potential symptoms: You are very confused. You faint or feel like you will faint. You have cold, clammy skin. You have blue lips or fingernails. You have thoughts of harming yourself or harming others. These symptoms may represent a serious problem that is an emergency. Do not wait to see if the symptoms will go away. Get medical help right away. Call your local emergency services (911 in the [place] Do not drive yourself to the hospital.  If you ever feel like you may hurt yourself or others, or have thoughts  about taking your own life, get help right away. Go to your nearest emergency department or: Call your local emergency services (911 in the [place] Call the [facility] (270-050-9545 in the [place] Call a suicide crisis helpline, such as the National Suicide Prevention Lifeline at 252-380-6020 or 988 in the [place] This is open 24 hours a day in the [place] Text the Crisis Text Line at 505-762-0652 (in the [place] Summary Opioid medicines can help you manage moderate to severe pain for a short period of time. A pain treatment plan is an agreement between you and your health care provider. Discuss the goals of your treatment, including how much pain you might expect to have and how you will manage the pain. If you think that you or someone else may have taken too much of an opioid, get medical help right away. This information is not intended to replace advice given to you by your health care provider. Make sure you discuss any questions you have with your health care provider. Document Revised: 05/14/2021 Document Reviewed: 01/29/2021 Elsevier Patient Education  2024 [employer]  Advanced directives: (Copy Requested) Please bring a copy of your health care power of attorney and living will to the office to be added to your chart at your convenience.  Next Medicare Annual Wellness Visit scheduled for next year: Yes  insert Preventive Care Attachment Reference

## 2023-10-29 ENCOUNTER — Other Ambulatory Visit: Payer: Self-pay | Admitting: Internal Medicine

## 2023-10-29 DIAGNOSIS — T8484XD Pain due to internal orthopedic prosthetic devices, implants and grafts, subsequent encounter: Secondary | ICD-10-CM

## 2023-10-29 DIAGNOSIS — Z96659 Presence of unspecified artificial knee joint: Secondary | ICD-10-CM

## 2023-11-01 ENCOUNTER — Ambulatory Visit: Payer: 59 | Admitting: Internal Medicine

## 2023-11-17 ENCOUNTER — Other Ambulatory Visit: Payer: Self-pay | Admitting: *Deleted

## 2023-11-17 DIAGNOSIS — M65969 Unspecified synovitis and tenosynovitis, unspecified lower leg: Secondary | ICD-10-CM

## 2023-11-17 DIAGNOSIS — T84018A Broken internal joint prosthesis, other site, initial encounter: Secondary | ICD-10-CM

## 2023-11-17 DIAGNOSIS — I10 Essential (primary) hypertension: Secondary | ICD-10-CM

## 2023-11-17 DIAGNOSIS — Z96659 Presence of unspecified artificial knee joint: Secondary | ICD-10-CM

## 2023-11-17 DIAGNOSIS — T8484XD Pain due to internal orthopedic prosthetic devices, implants and grafts, subsequent encounter: Secondary | ICD-10-CM

## 2023-11-17 MED ORDER — VALSARTAN-HYDROCHLOROTHIAZIDE 160-25 MG PO TABS: 1.0000 | ORAL_TABLET | Freq: Every day | ORAL | 1 refills | Status: DC

## 2023-11-17 MED ORDER — AMLODIPINE BESYLATE 5 MG PO TABS: 5.0000 mg | ORAL_TABLET | Freq: Every day | ORAL | 1 refills | Status: DC

## 2023-11-17 NOTE — Telephone Encounter (Signed)
 Spoke with patient and she would like her refills to go to [facility]

## 2023-11-18 ENCOUNTER — Telehealth: Payer: Self-pay | Admitting: Internal Medicine

## 2023-11-18 NOTE — Telephone Encounter (Signed)
Spoke with patient and informed her  Ok, once appointment is scheduled with pain management Dr Ardyth Harps will refill tramadol.

## 2023-11-18 NOTE — Telephone Encounter (Signed)
Faxed request for patient RX/Medications list, following up

## 2023-11-22 MED ORDER — DICLOFENAC SODIUM 1 % EX GEL: CUTANEOUS | 0 refills | Status: DC

## 2023-11-22 MED ORDER — TRAMADOL HCL 50 MG PO TABS: 50.0000 mg | ORAL_TABLET | Freq: Two times a day (BID) | ORAL | 0 refills | Status: DC | PRN

## 2023-11-22 NOTE — Telephone Encounter (Signed)
Patient has scheduled an appointment 11/25/23.

## 2023-11-25 ENCOUNTER — Ambulatory Visit: Payer: 59 | Admitting: Orthopaedic Surgery

## 2023-12-03 ENCOUNTER — Other Ambulatory Visit: Payer: Self-pay | Admitting: Internal Medicine

## 2023-12-03 DIAGNOSIS — M65969 Unspecified synovitis and tenosynovitis, unspecified lower leg: Secondary | ICD-10-CM

## 2023-12-16 ENCOUNTER — Other Ambulatory Visit: Payer: Self-pay | Admitting: Internal Medicine

## 2023-12-16 DIAGNOSIS — Z96659 Presence of unspecified artificial knee joint: Secondary | ICD-10-CM

## 2023-12-16 DIAGNOSIS — T8484XD Pain due to internal orthopedic prosthetic devices, implants and grafts, subsequent encounter: Secondary | ICD-10-CM

## 2023-12-16 NOTE — Telephone Encounter (Signed)
Copied from CRM [id number]. Topic: Clinical - Medication Refill >> Dec 16, 2023 11:02 AM Dimitri Ped wrote: Most Recent Primary Care Visit:  Provider: Tillie Rung  Department: [facility]  Visit Type: MEDICARE AWV, SEQUENTIAL  Date: 09/24/2023  Medication: traMADol (ULTRAM) 50 MG tablet   Has the patient contacted their pharmacy? No just called doctor office  (Agent: If no, request that the patient contact the pharmacy for the refill. If patient does not wish to contact the pharmacy document the reason why and proceed with request.) (Agent: If yes, when and what did the pharmacy advise?)  Is this the correct pharmacy for this prescription? Yes If no, delete pharmacy and type the correct one.  This is the patient's preferred pharmacy:  [facility] [phone] [facility] [place], [place] - [address] [address]  [address] [place] [place] [address] [phone]    [id number] [place], [place] - [address] [place] [address] Phone: [phone] Fax: [phone]  [facility] - [place], [place] - [address] [place] [address] Phone: [phone] Fax: [phone]   Has the prescription been filled recently? No  Is the patient out of the medication? Yes  Has the patient been seen for an appointment in the last year OR does the patient have an upcoming appointment? Yes  Can we respond through MyChart? Yes  Agent: Please be advised that Rx refills may take up to 3 business days. We ask that you follow-up with your pharmacy.

## 2023-12-21 MED ORDER — TRAMADOL HCL 50 MG PO TABS
50.0000 mg | ORAL_TABLET | Freq: Two times a day (BID) | ORAL | 0 refills | Status: DC | PRN
Start: 2023-12-21 — End: 2024-01-31

## 2024-01-31 ENCOUNTER — Other Ambulatory Visit: Payer: Self-pay | Admitting: Internal Medicine

## 2024-01-31 DIAGNOSIS — T8484XD Pain due to internal orthopedic prosthetic devices, implants and grafts, subsequent encounter: Secondary | ICD-10-CM

## 2024-01-31 DIAGNOSIS — T84018A Broken internal joint prosthesis, other site, initial encounter: Secondary | ICD-10-CM

## 2024-03-08 ENCOUNTER — Other Ambulatory Visit: Payer: Self-pay | Admitting: Internal Medicine

## 2024-03-08 DIAGNOSIS — Z96652 Presence of left artificial knee joint: Secondary | ICD-10-CM

## 2024-03-08 DIAGNOSIS — Z96659 Presence of unspecified artificial knee joint: Secondary | ICD-10-CM

## 2024-03-24 ENCOUNTER — Other Ambulatory Visit: Payer: Self-pay | Admitting: Internal Medicine

## 2024-03-24 DIAGNOSIS — I10 Essential (primary) hypertension: Secondary | ICD-10-CM

## 2024-04-10 ENCOUNTER — Other Ambulatory Visit: Payer: Self-pay | Admitting: Internal Medicine

## 2024-04-10 DIAGNOSIS — T8484XD Pain due to internal orthopedic prosthetic devices, implants and grafts, subsequent encounter: Secondary | ICD-10-CM

## 2024-04-10 DIAGNOSIS — T84018A Broken internal joint prosthesis, other site, initial encounter: Secondary | ICD-10-CM

## 2024-05-10 ENCOUNTER — Other Ambulatory Visit: Payer: Self-pay | Admitting: Family Medicine

## 2024-05-10 DIAGNOSIS — Z96659 Presence of unspecified artificial knee joint: Secondary | ICD-10-CM

## 2024-05-10 DIAGNOSIS — T8484XD Pain due to internal orthopedic prosthetic devices, implants and grafts, subsequent encounter: Secondary | ICD-10-CM

## 2024-05-16 ENCOUNTER — Ambulatory Visit: Admitting: Internal Medicine

## 2024-06-16 ENCOUNTER — Other Ambulatory Visit: Payer: Self-pay | Admitting: Internal Medicine

## 2024-06-16 DIAGNOSIS — T84018A Broken internal joint prosthesis, other site, initial encounter: Secondary | ICD-10-CM

## 2024-06-16 DIAGNOSIS — T8484XD Pain due to internal orthopedic prosthetic devices, implants and grafts, subsequent encounter: Secondary | ICD-10-CM

## 2024-08-11 ENCOUNTER — Other Ambulatory Visit: Payer: Self-pay | Admitting: Internal Medicine

## 2024-08-11 DIAGNOSIS — I10 Essential (primary) hypertension: Secondary | ICD-10-CM

## 2024-08-11 DIAGNOSIS — T8484XD Pain due to internal orthopedic prosthetic devices, implants and grafts, subsequent encounter: Secondary | ICD-10-CM

## 2024-08-11 DIAGNOSIS — T84018A Broken internal joint prosthesis, other site, initial encounter: Secondary | ICD-10-CM

## 2024-08-16 NOTE — Progress Notes (Signed)
 ALYDA MEGNA                                          MRN: [id number]   08/16/2024   The [facility] Quality Team Specialist reviewed this patient medical record for the purposes of chart review for care gap closure. The following were reviewed: chart review for care gap closure-breast cancer screening.    [facility] Quality Team

## 2024-09-04 ENCOUNTER — Encounter: Payer: Self-pay | Admitting: Radiology

## 2024-09-08 ENCOUNTER — Other Ambulatory Visit: Payer: Self-pay | Admitting: Internal Medicine

## 2024-09-08 DIAGNOSIS — I10 Essential (primary) hypertension: Secondary | ICD-10-CM

## 2024-09-14 ENCOUNTER — Telehealth: Payer: Self-pay

## 2024-09-14 NOTE — Telephone Encounter (Signed)
 Patient is overdue for PCP visit. No answer, unable to [facility].

## 2024-09-25 ENCOUNTER — Encounter

## 2024-11-01 ENCOUNTER — Other Ambulatory Visit: Payer: Self-pay | Admitting: Internal Medicine

## 2024-11-01 DIAGNOSIS — I10 Essential (primary) hypertension: Secondary | ICD-10-CM

## 2024-12-04 ENCOUNTER — Other Ambulatory Visit: Payer: Self-pay | Admitting: Internal Medicine

## 2024-12-04 DIAGNOSIS — I10 Essential (primary) hypertension: Secondary | ICD-10-CM
